# Patient Record
Sex: Female | Born: 1975 | Race: White | Hispanic: No | Smoking: Former smoker
Health system: Southern US, Community
[De-identification: ages and names within clinical notes are randomized; demographics above are authoritative.]

## PROBLEM LIST (undated history)

## (undated) DIAGNOSIS — E785 Hyperlipidemia, unspecified: Secondary | ICD-10-CM

## (undated) DIAGNOSIS — R519 Headache, unspecified: Secondary | ICD-10-CM

## (undated) DIAGNOSIS — J439 Emphysema, unspecified: Secondary | ICD-10-CM

## (undated) DIAGNOSIS — I10 Essential (primary) hypertension: Secondary | ICD-10-CM

## (undated) DIAGNOSIS — R51 Headache: Secondary | ICD-10-CM

## (undated) DIAGNOSIS — F419 Anxiety disorder, unspecified: Secondary | ICD-10-CM

## (undated) HISTORY — DX: Emphysema, unspecified: J43.9

## (undated) HISTORY — DX: Headache: R51

## (undated) HISTORY — DX: Hyperlipidemia, unspecified: E78.5

## (undated) HISTORY — PX: TONSILLECTOMY: SUR1361

## (undated) HISTORY — PX: TONSILECTOMY/ADENOIDECTOMY WITH MYRINGOTOMY: SHX6125

## (undated) HISTORY — DX: Anxiety disorder, unspecified: F41.9

## (undated) HISTORY — PX: DILATION AND CURETTAGE OF UTERUS: SHX78

## (undated) HISTORY — DX: Headache, unspecified: R51.9

---

## 2000-08-26 ENCOUNTER — Inpatient Hospital Stay (HOSPITAL_COMMUNITY): Admission: AD | Admit: 2000-08-26 | Discharge: 2000-08-26 | Payer: Self-pay | Admitting: Obstetrics

## 2000-08-26 ENCOUNTER — Encounter: Payer: Self-pay | Admitting: *Deleted

## 2003-12-31 ENCOUNTER — Emergency Department: Payer: Self-pay | Admitting: Emergency Medicine

## 2004-06-19 ENCOUNTER — Ambulatory Visit: Payer: Self-pay

## 2007-12-28 ENCOUNTER — Emergency Department: Payer: Self-pay | Admitting: Internal Medicine

## 2008-08-11 ENCOUNTER — Emergency Department: Payer: Self-pay | Admitting: Unknown Physician Specialty

## 2011-03-13 ENCOUNTER — Encounter: Payer: Self-pay | Admitting: Obstetrics & Gynecology

## 2011-03-13 IMAGING — US US OB NUCHAL TRANSLUCENCY 1ST GEST - [facility] NRPT
1 series · 14 of 28 positions shown · non-contrast
Comparison: none

[Series 1: us ob nuchal translucency 1st gest - [facility] nrpt · 14 of 38 slices shown]
[im 2/38]
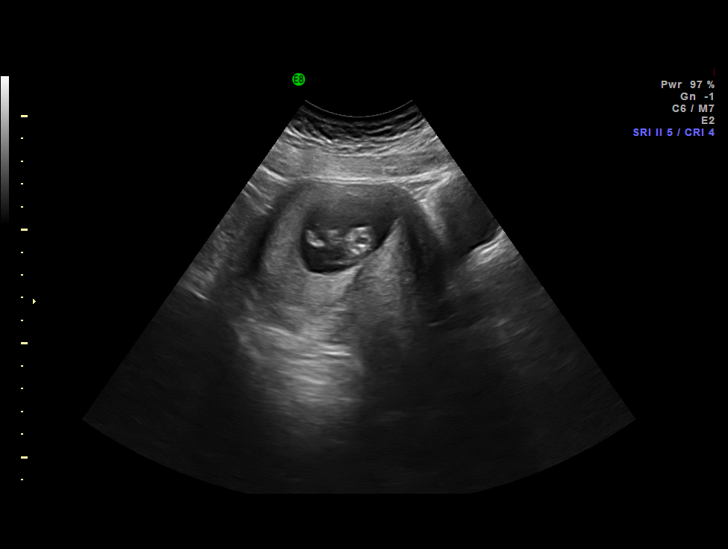
[im 5/38]
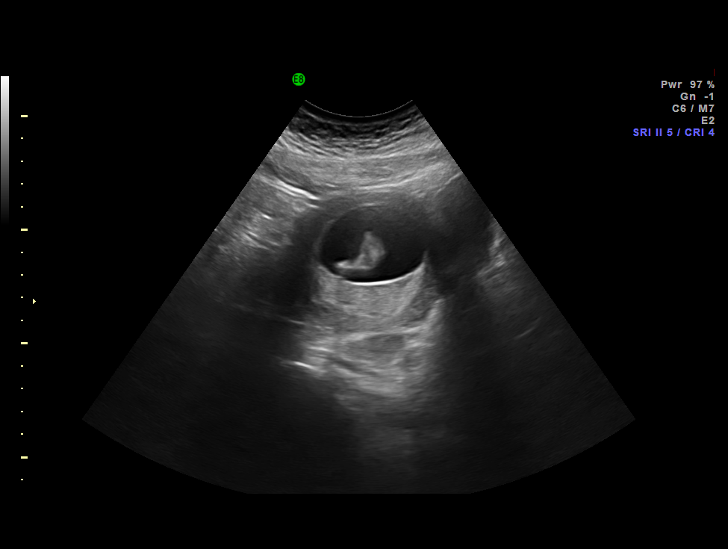
[im 7/38]
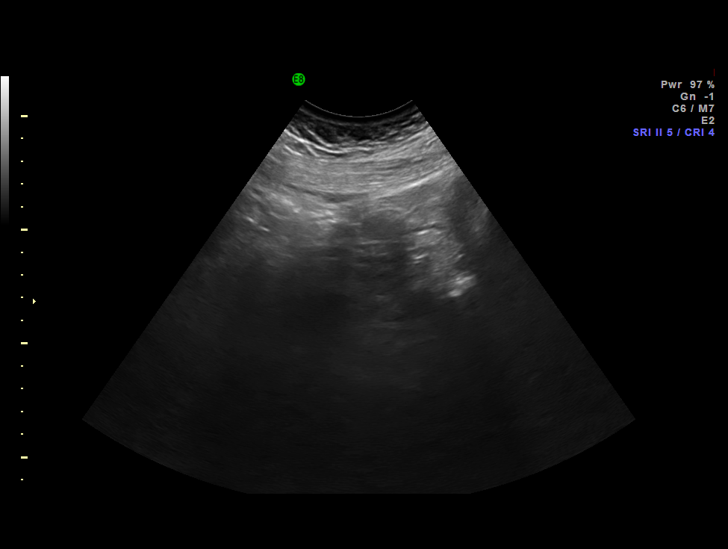
[im 10/38]
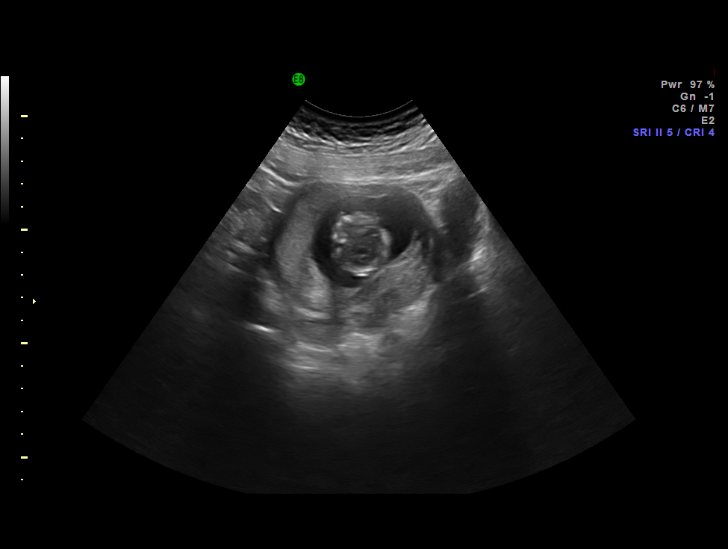
[im 13/38]
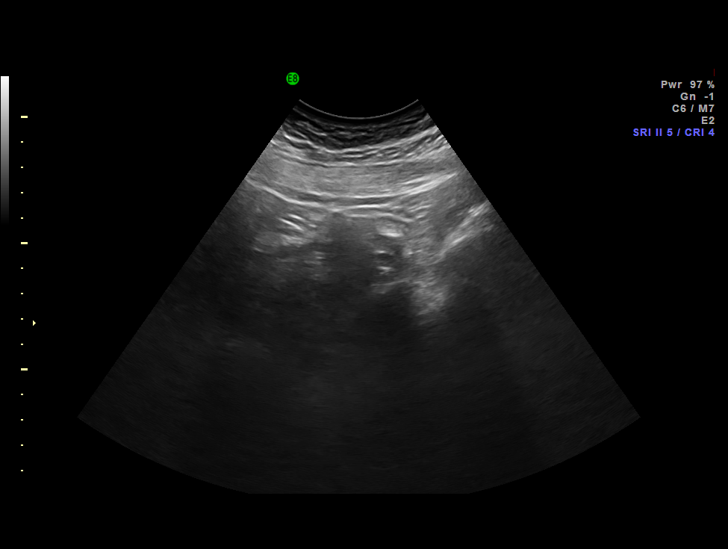
[im 16/38]
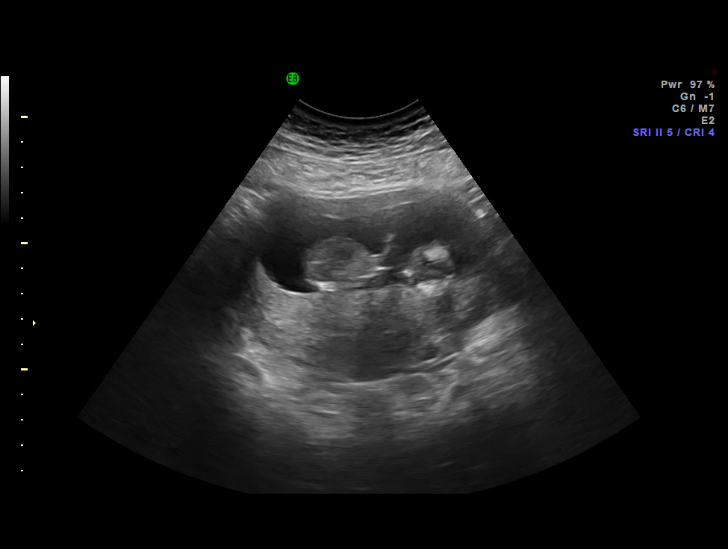
[im 18/38]
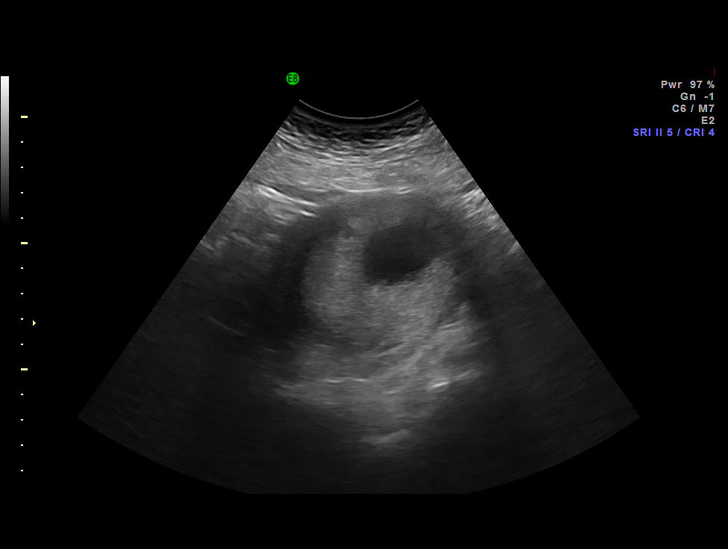
[im 21/38]
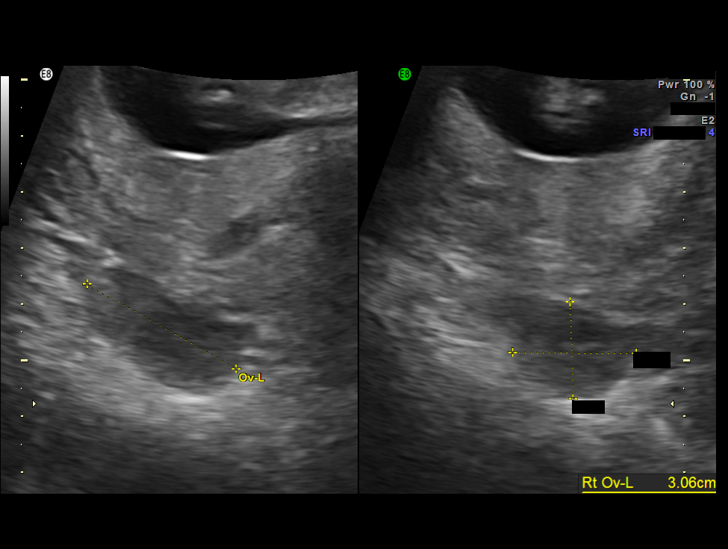
[im 24/38]
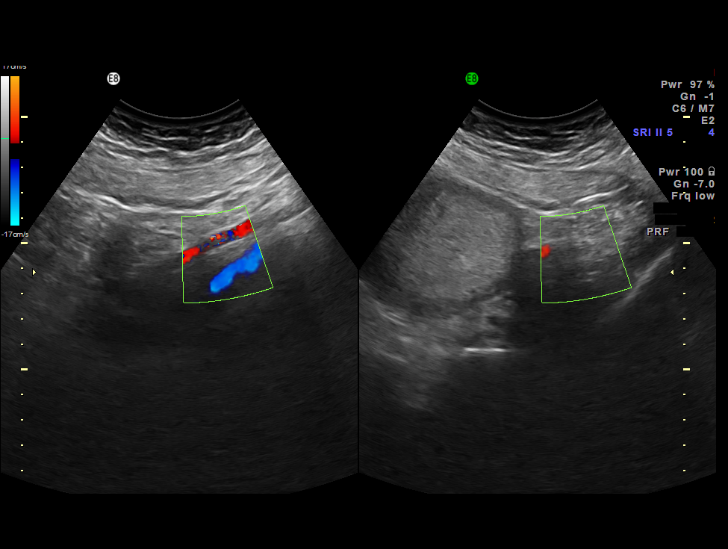
[im 27/38]
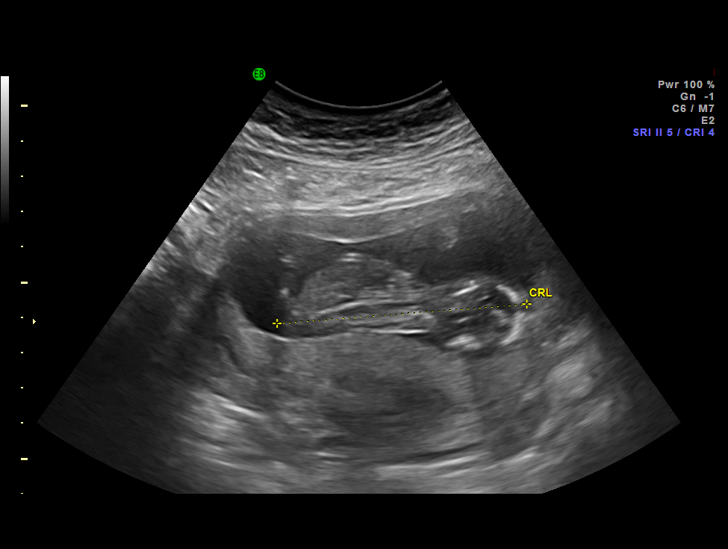
[im 29/38]
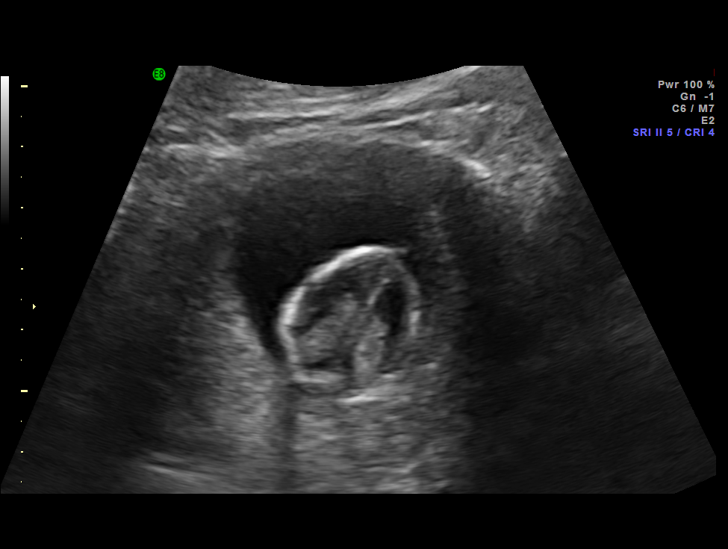
[im 32/38]
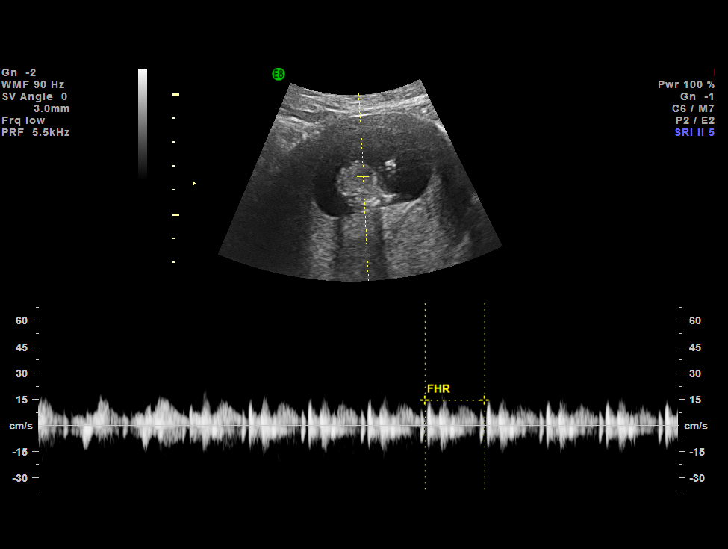
[im 35/38]
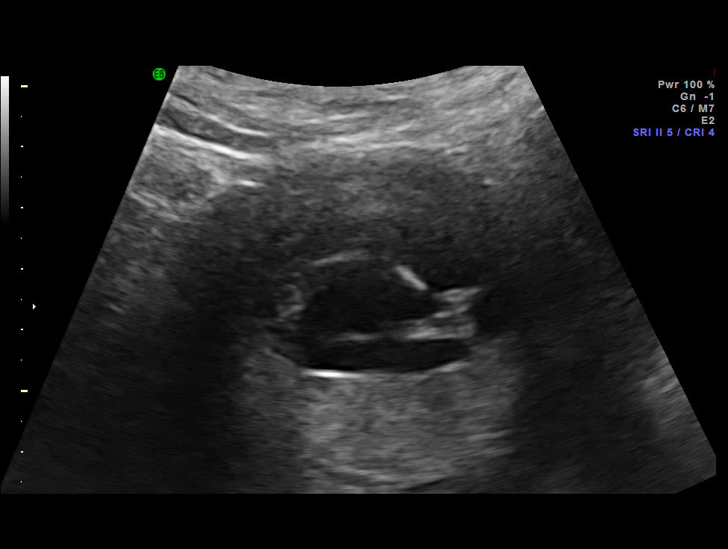
[im 38/38]
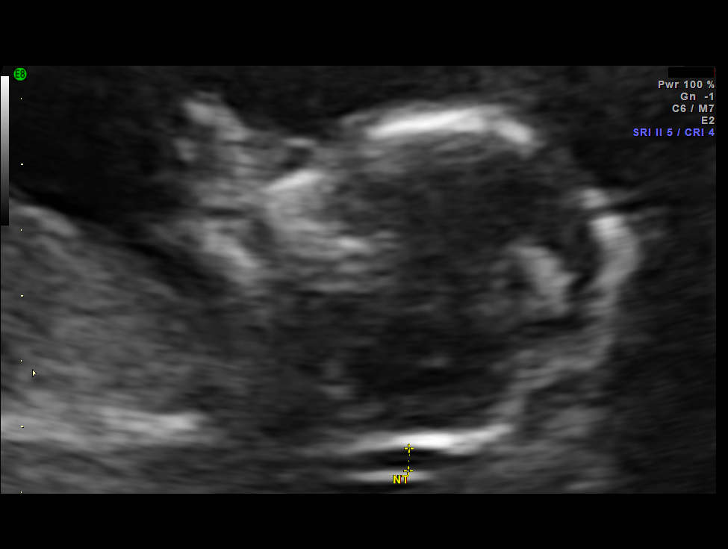

[14 of 28 positions shown; findings below may reference images not displayed]

IMAGES IMPORTED FROM THE [employer] WORKFLOW SYSTEM
NO DICTATION FOR STUDY

## 2011-03-25 HISTORY — PX: TUBAL LIGATION: SHX77

## 2011-04-21 ENCOUNTER — Encounter: Payer: Self-pay | Admitting: Obstetrics and Gynecology

## 2011-04-21 IMAGING — US US OB DETAIL+14 WK - NRPT [facility]
1 series · 14 of 28 positions shown · non-contrast
Comparison: none

[Series 1: us ob detail+14 wk - nrpt [facility] · 14 of 85 slices shown]
[im 4/85]
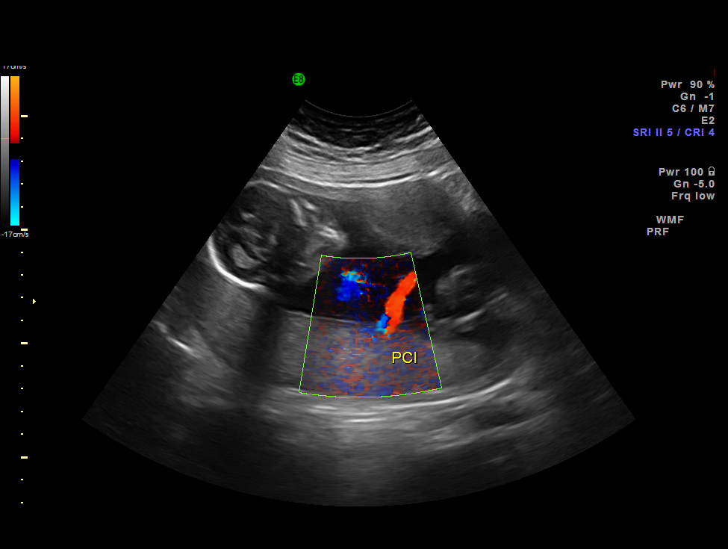
[im 10/85]
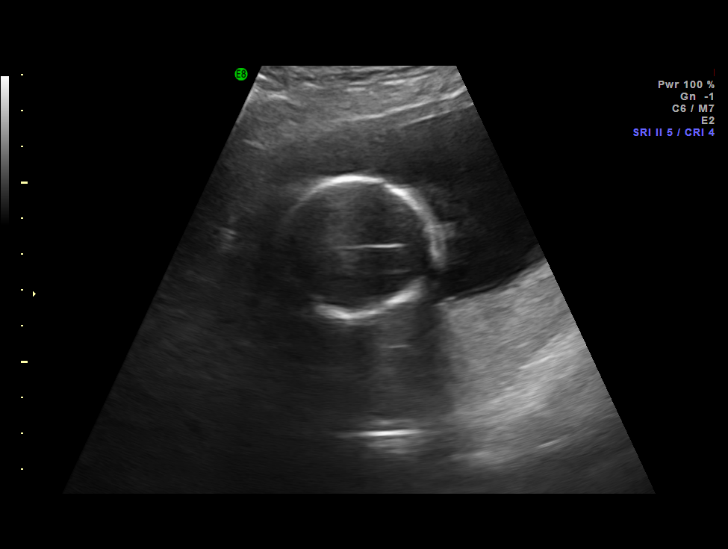
[im 16/85]
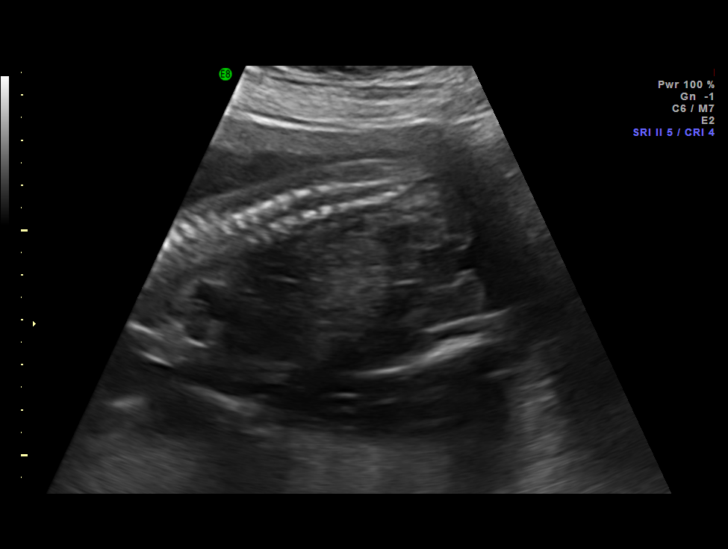
[im 22/85]
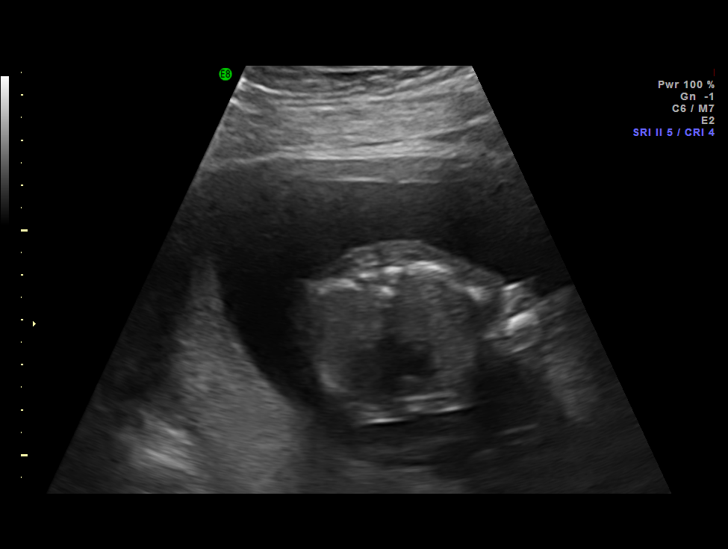
[im 29/85]
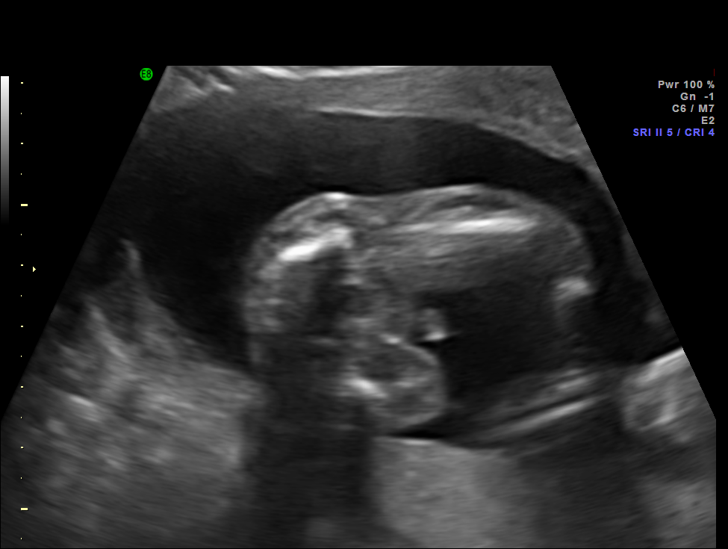
[im 35/85]
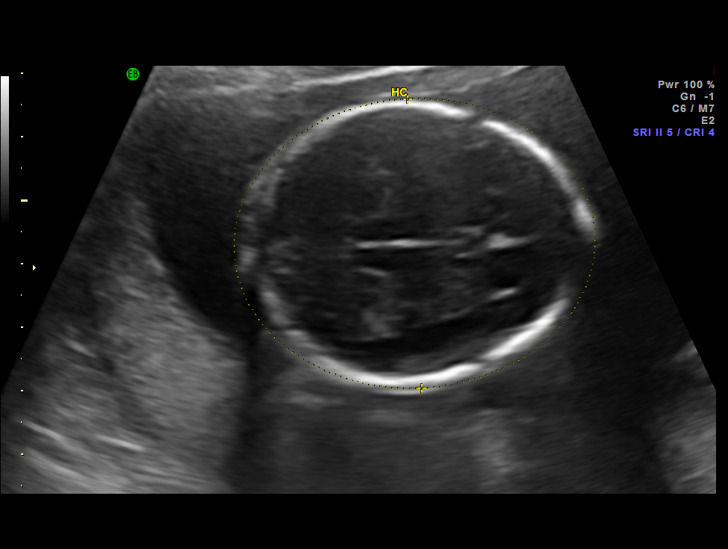
[im 41/85]
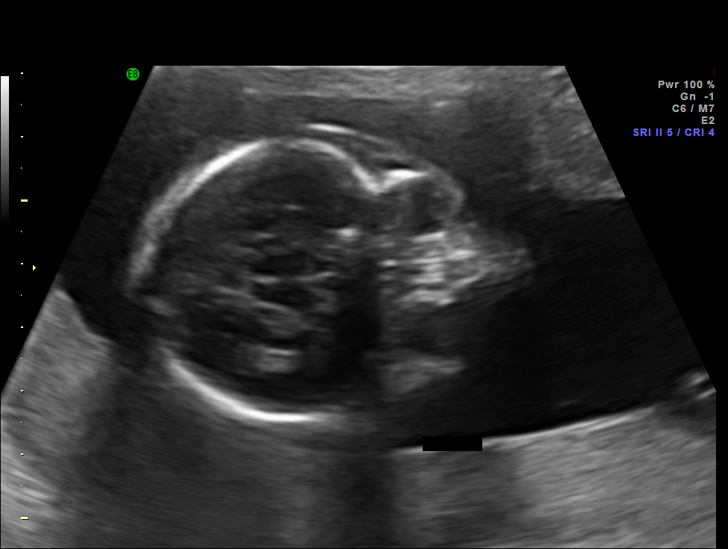
[im 47/85]
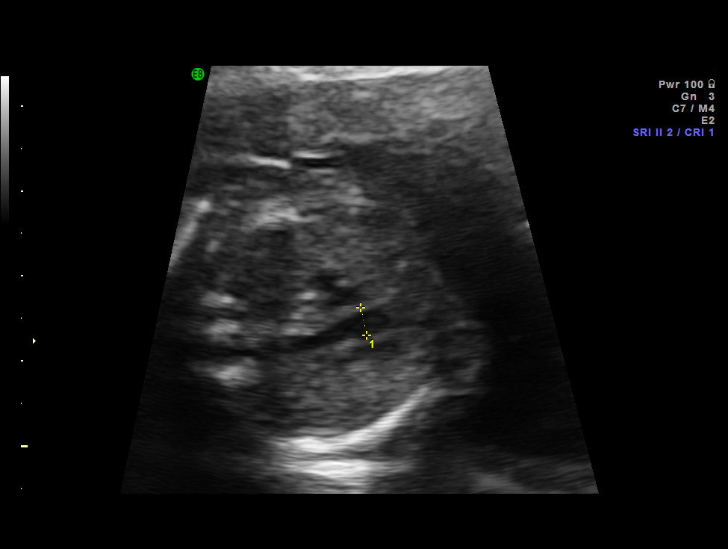
[im 53/85]
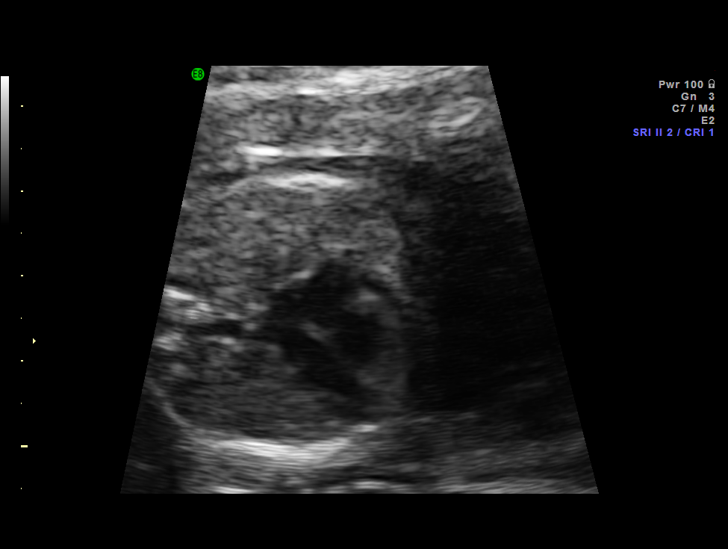
[im 60/85]
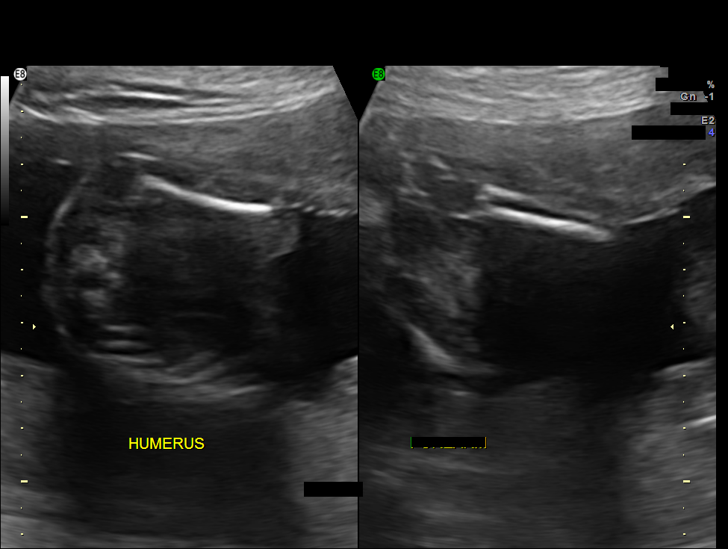
[im 66/85]
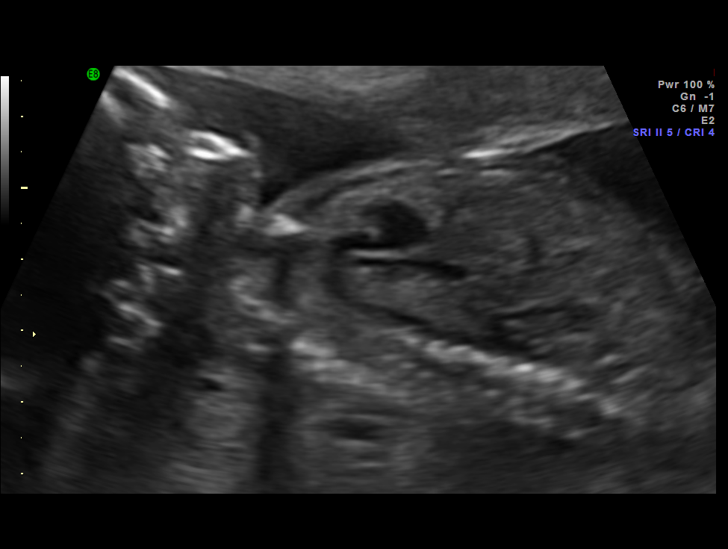
[im 72/85]
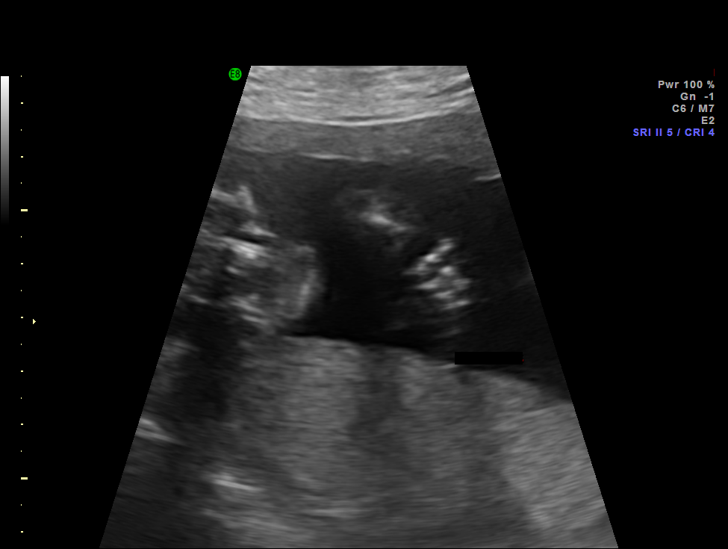
[im 78/85]
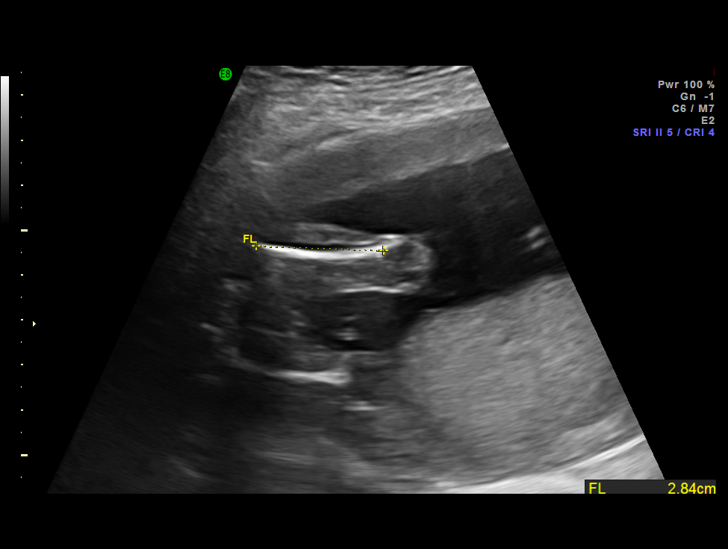
[im 85/85]
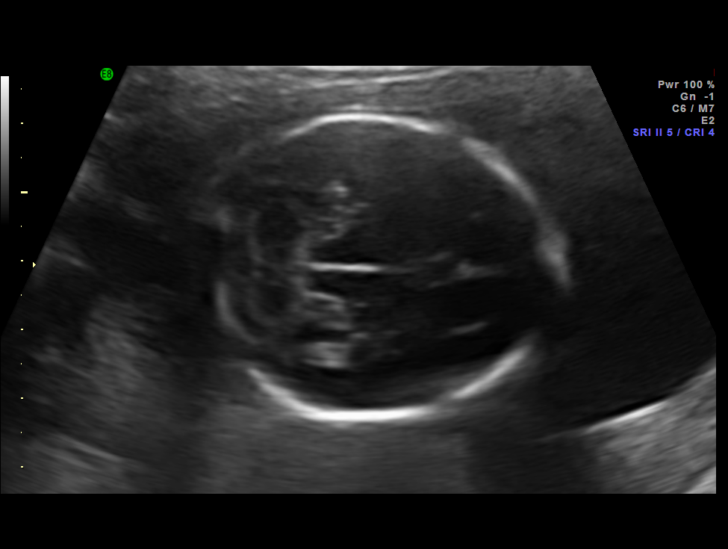

[14 of 28 positions shown; findings below may reference images not displayed]

IMAGES IMPORTED FROM THE SYNGO WORKFLOW SYSTEM
NO DICTATION FOR STUDY

## 2011-05-05 ENCOUNTER — Encounter: Payer: Self-pay | Admitting: Obstetrics and Gynecology

## 2011-05-05 IMAGING — US ULTRAOUND OB LIMITED - NRPT [facility]
1 series · 14 of 17 positions shown · non-contrast
Comparison: none

[Series 1: ultraound ob limited - nrpt [facility] · 14 of 17 slices shown]
[im 1/17]
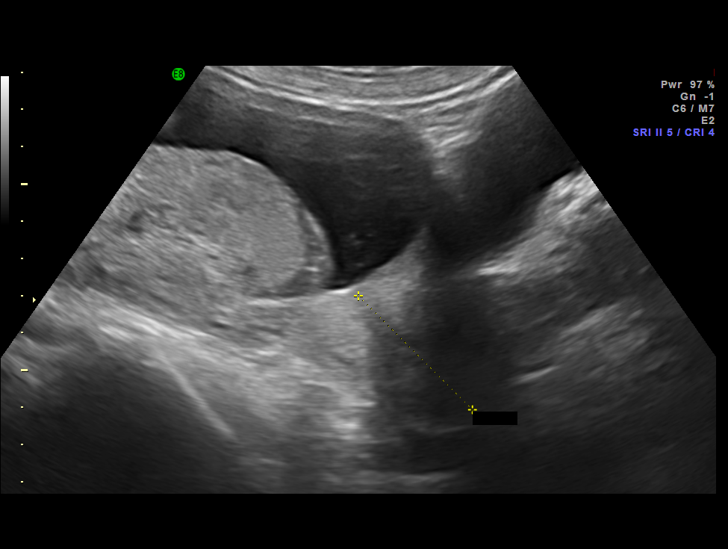
[im 2/17]
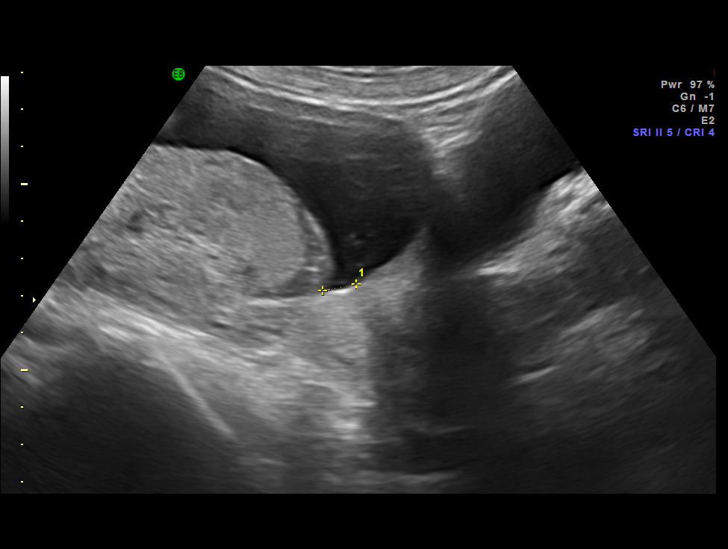
[im 4/17]
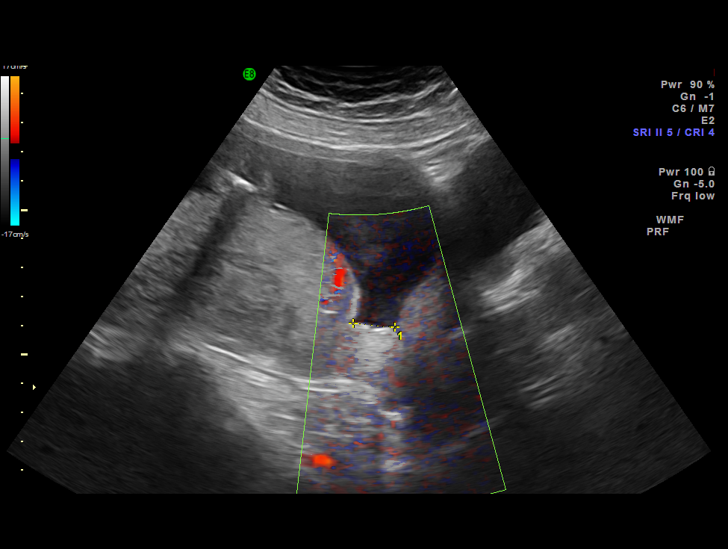
[im 5/17]
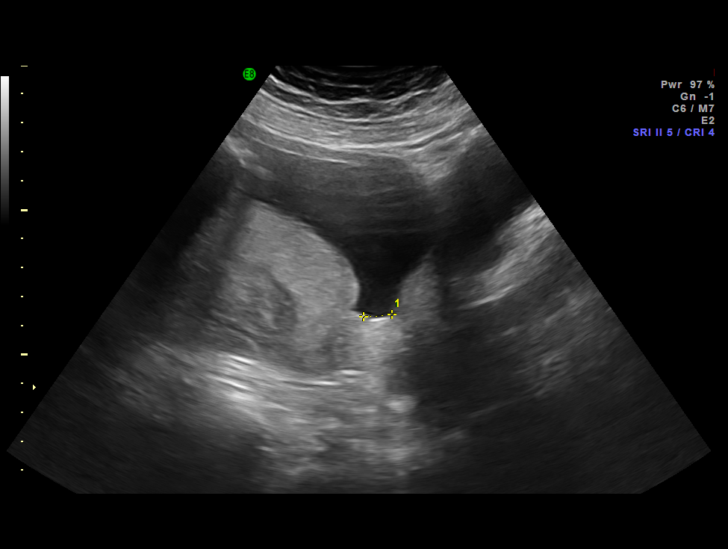
[im 6/17]
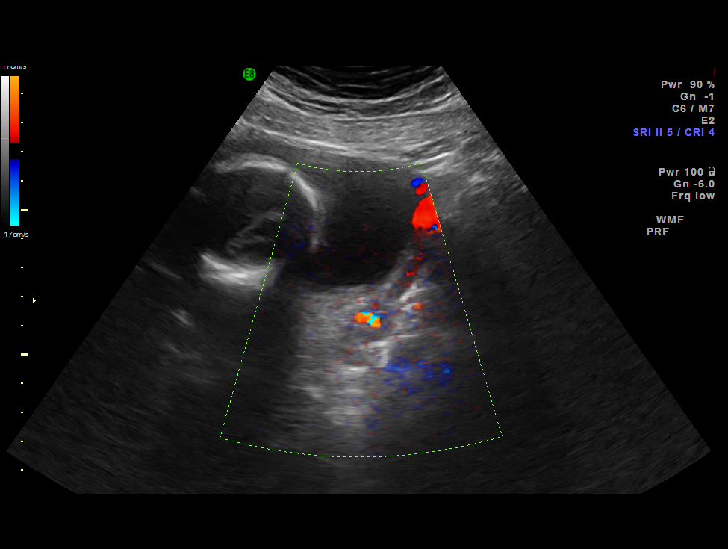
[im 7/17]
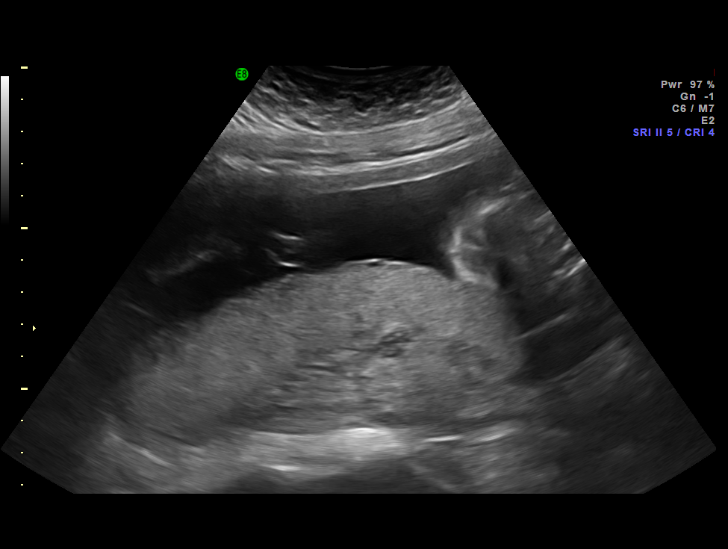
[im 8/17]
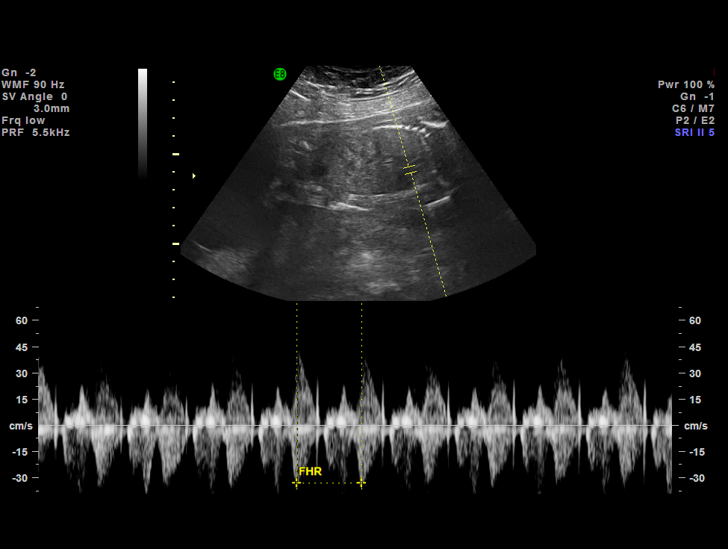
[im 10/17]
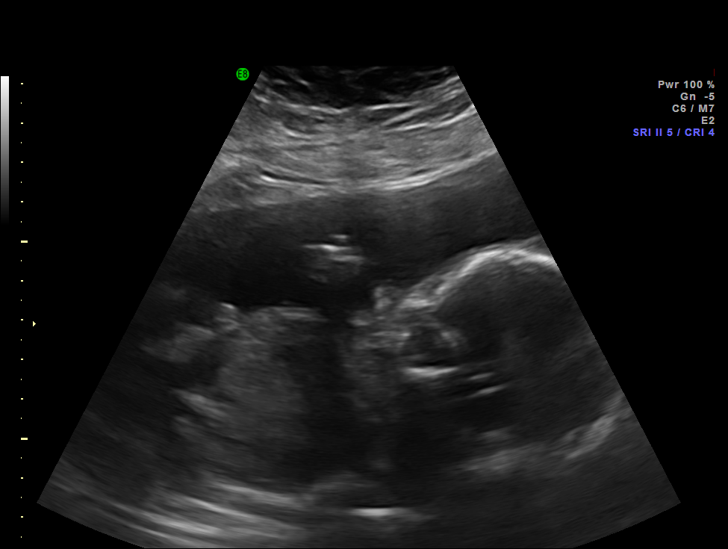
[im 11/17]
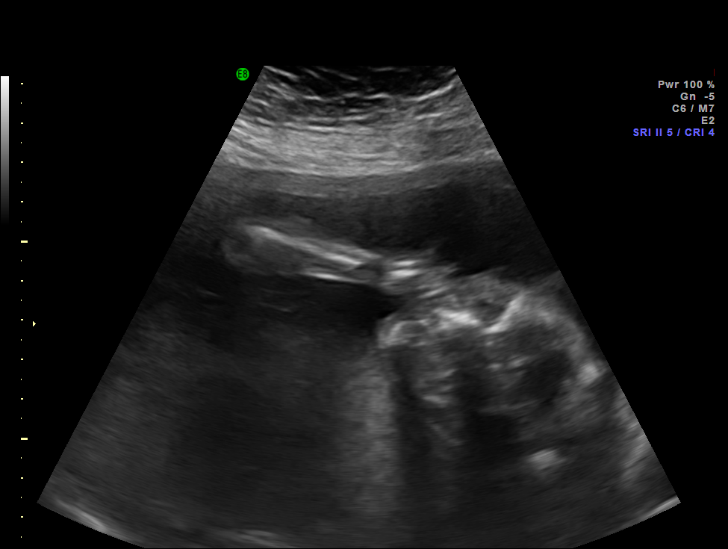
[im 12/17]
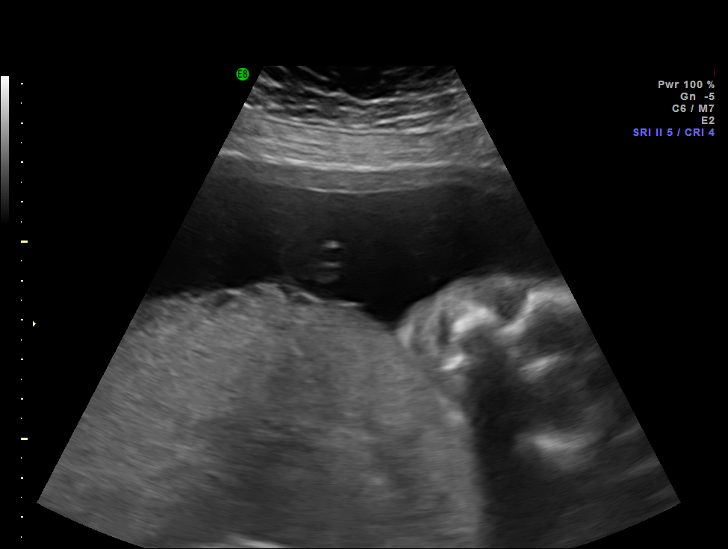
[im 13/17]
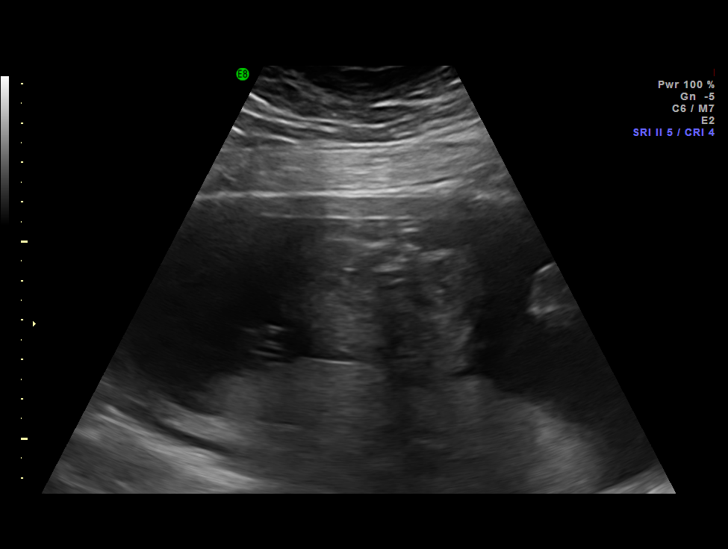
[im 14/17]
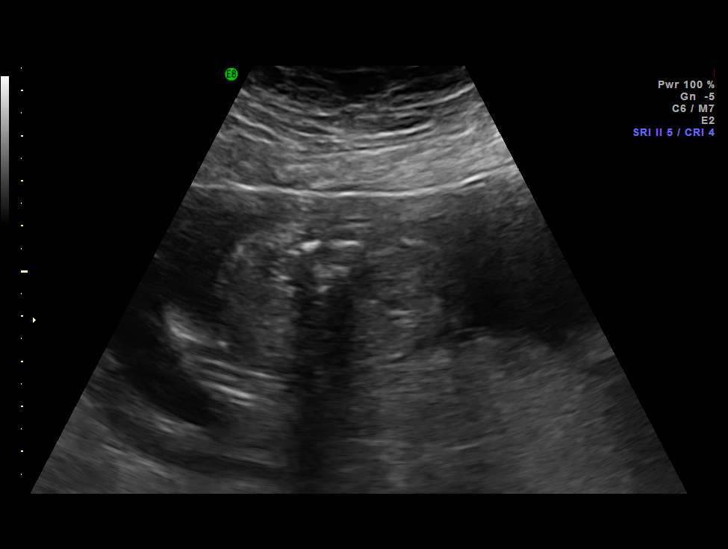
[im 16/17]
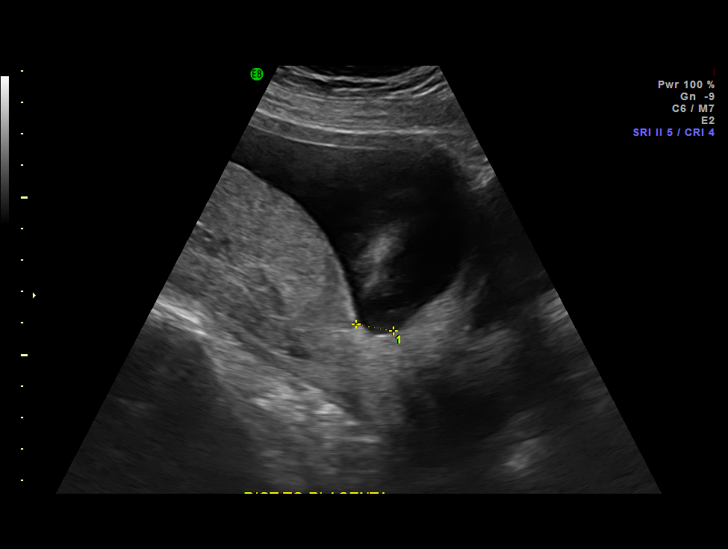
[im 17/17]
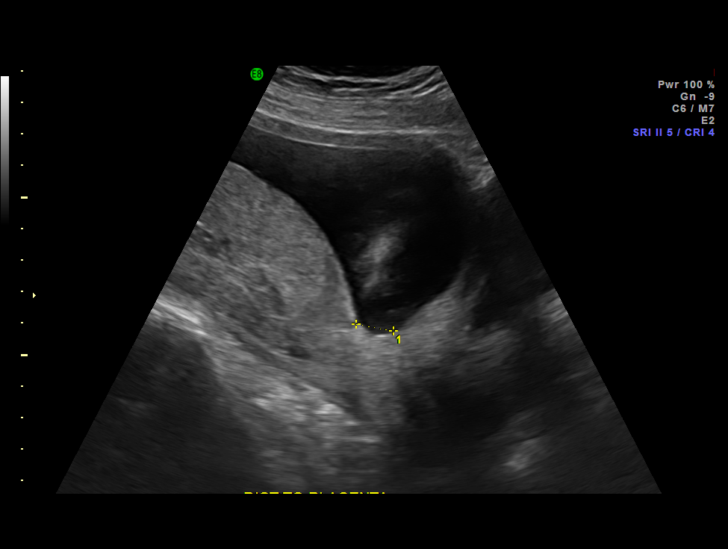

[14 of 17 positions shown; findings below may reference images not displayed]

IMAGES IMPORTED FROM THE SYNGO WORKFLOW SYSTEM
NO DICTATION FOR STUDY

## 2011-05-19 ENCOUNTER — Encounter: Payer: Self-pay | Admitting: Obstetrics & Gynecology

## 2011-05-19 IMAGING — US ULTRAOUND OB LIMITED - NRPT [facility]
1 series · 13 of 13 positions shown · non-contrast
Comparison: none

[Series 1: ultraound ob limited - nrpt [facility] · 13 of 13 slices shown]
[im 1/13]
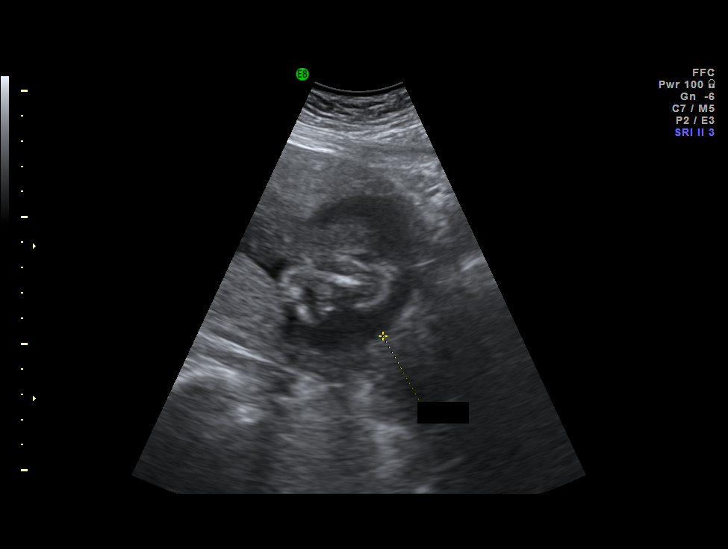
[im 2/13]
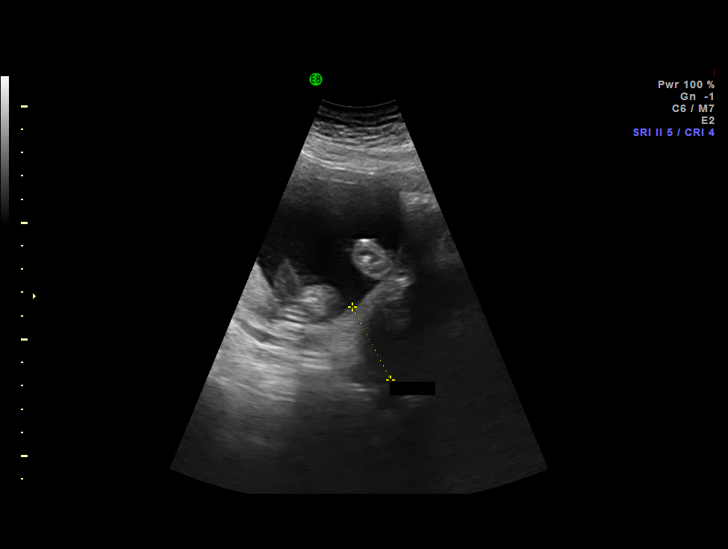
[im 3/13]
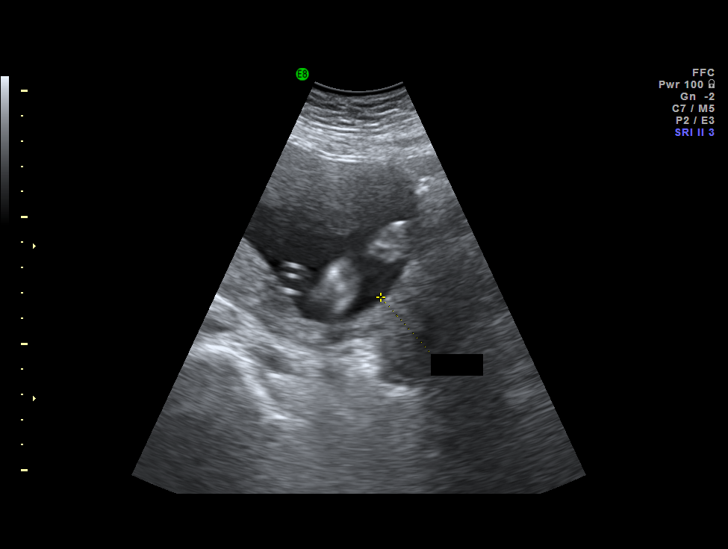
[im 4/13]
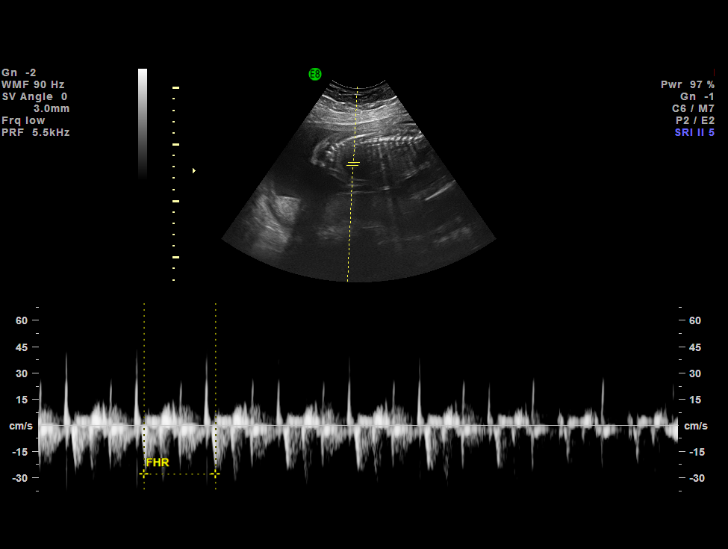
[im 5/13]
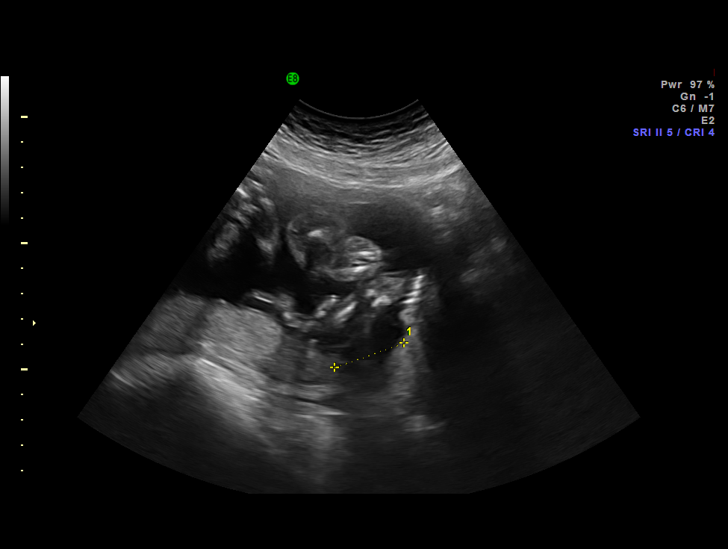
[im 6/13]
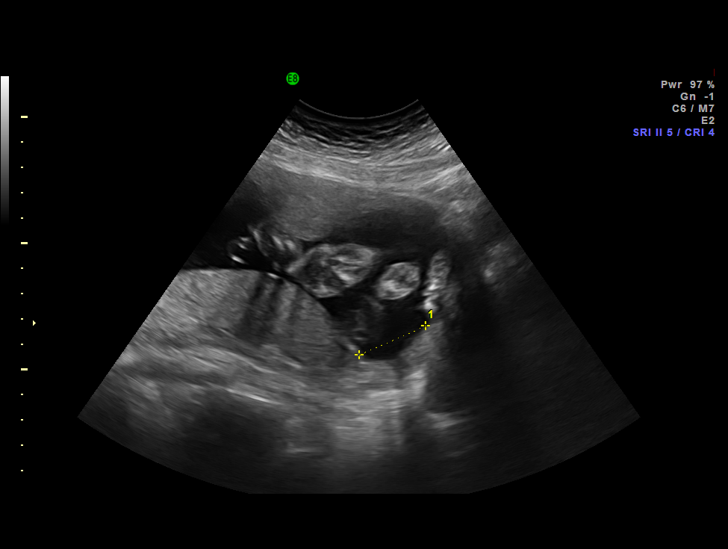
[im 7/13]
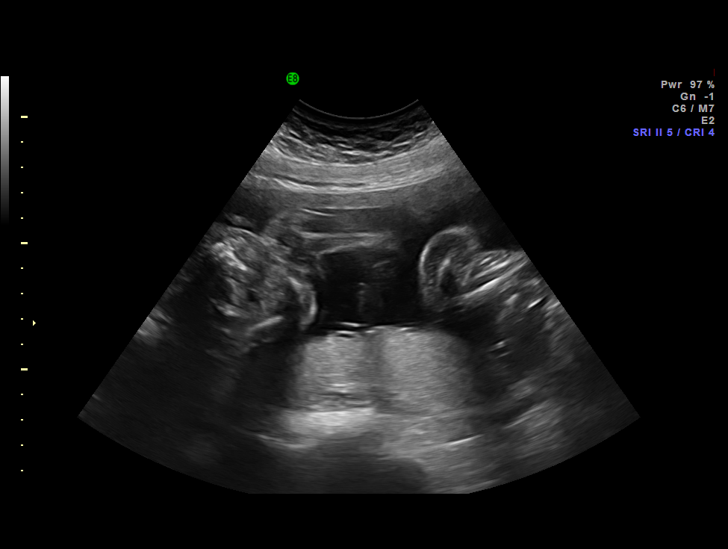
[im 8/13]
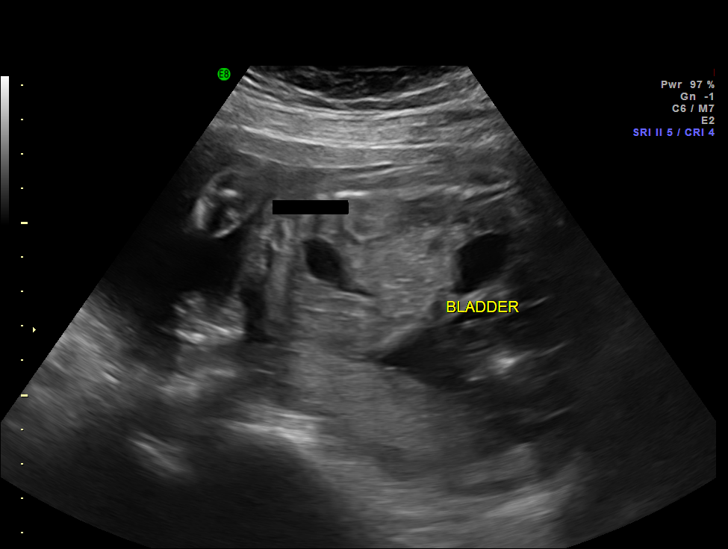
[im 9/13]
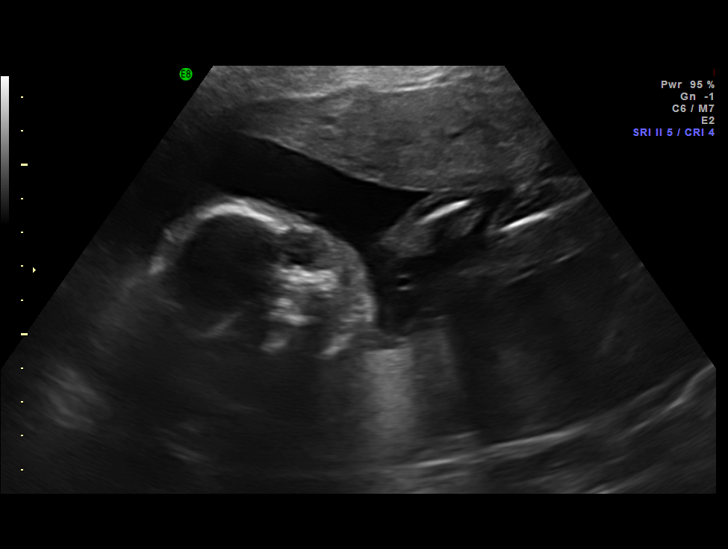
[im 10/13]
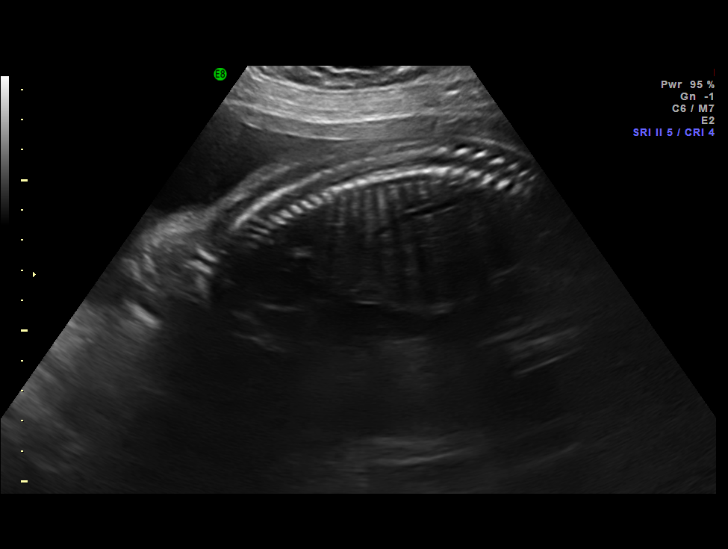
[im 11/13]
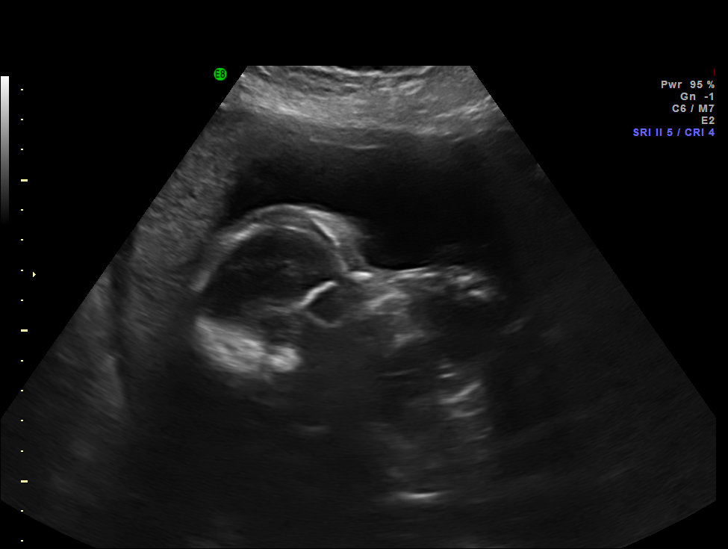
[im 12/13]
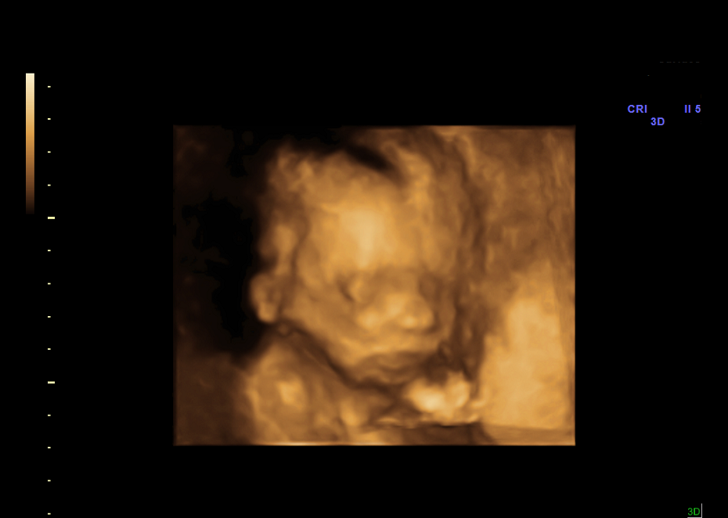
[im 13/13]
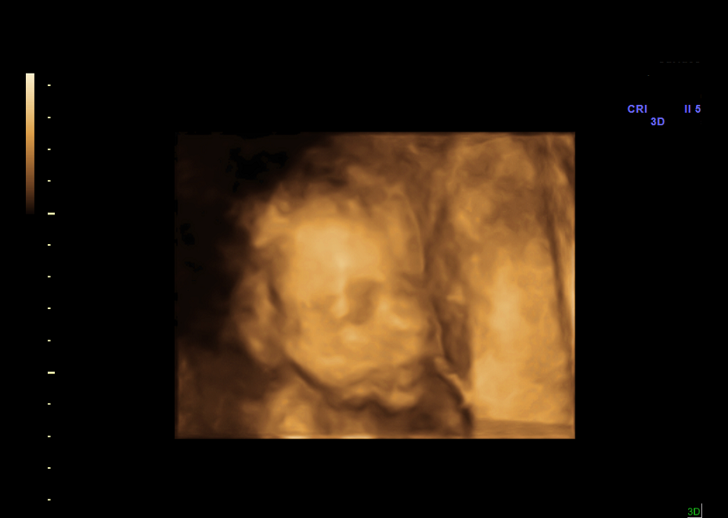

[13 of 13 positions shown; findings below may reference images not displayed]

IMAGES IMPORTED FROM THE SYNGO WORKFLOW SYSTEM
NO DICTATION FOR STUDY

## 2011-06-02 ENCOUNTER — Encounter: Payer: Self-pay | Admitting: Maternal & Fetal Medicine

## 2011-06-02 IMAGING — US ULTRAOUND OB LIMITED - NRPT [facility]
1 series · 13 of 13 positions shown · non-contrast
Comparison: none

[Series 1: ultraound ob limited - nrpt [facility] · 13 of 13 slices shown]
[im 1/13]
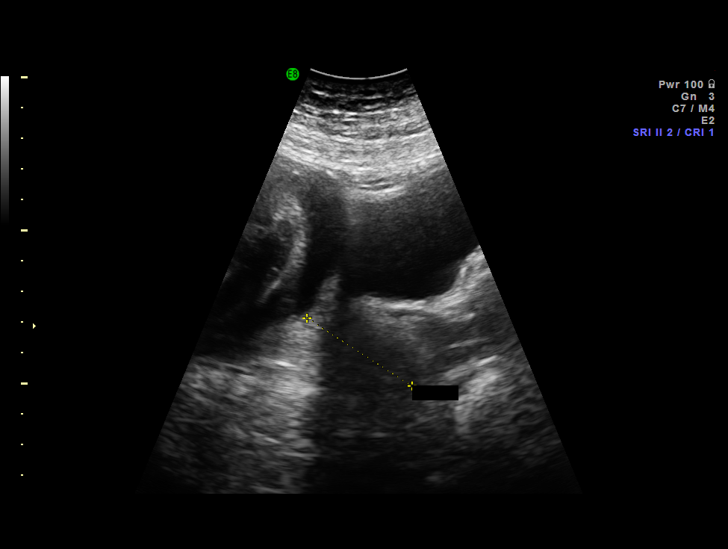
[im 2/13]
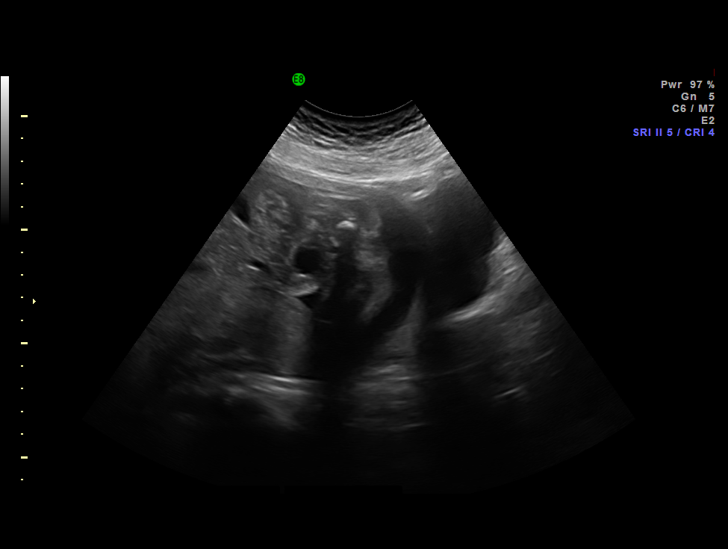
[im 3/13]
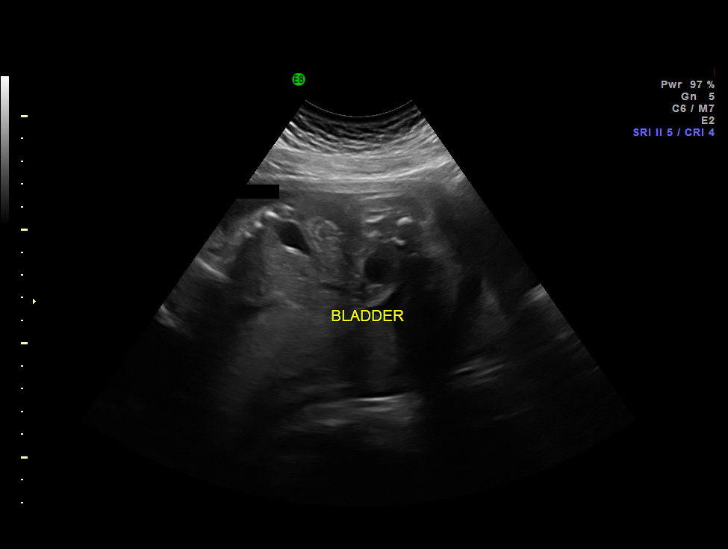
[im 4/13]
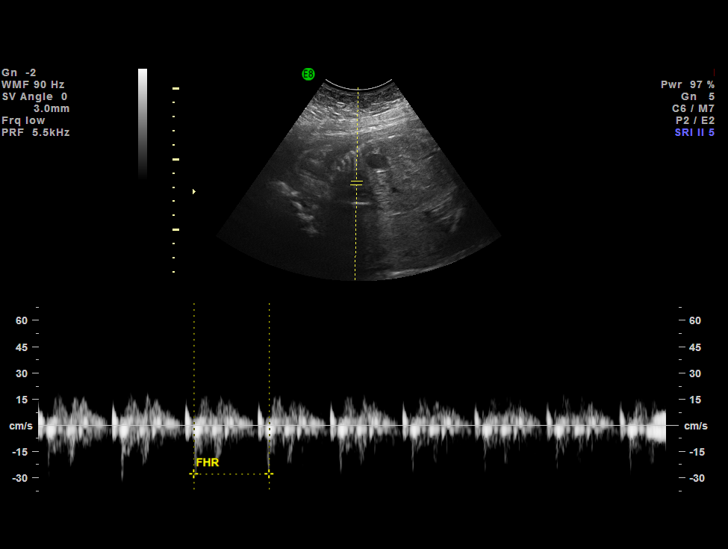
[im 5/13]
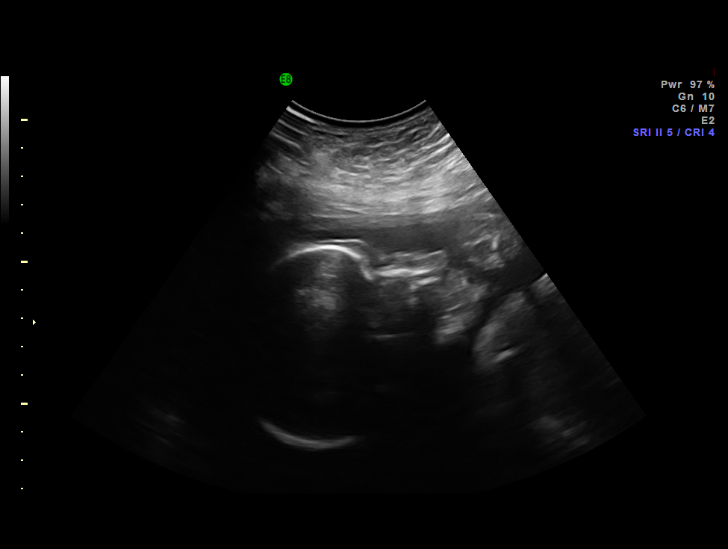
[im 6/13]
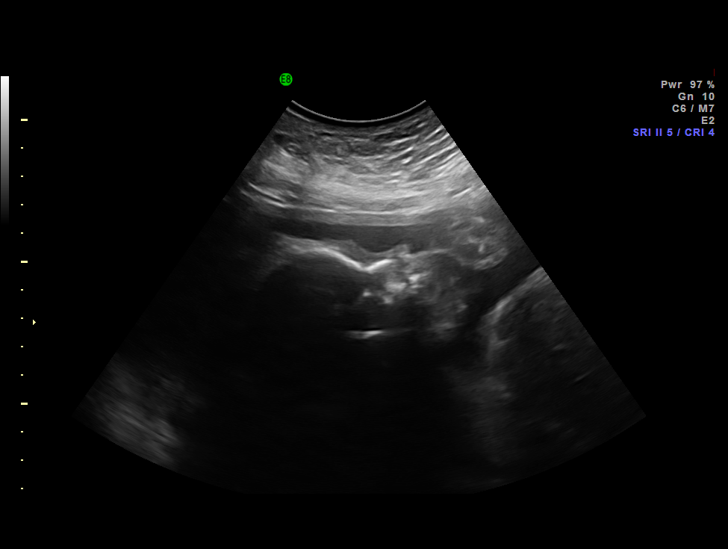
[im 7/13]
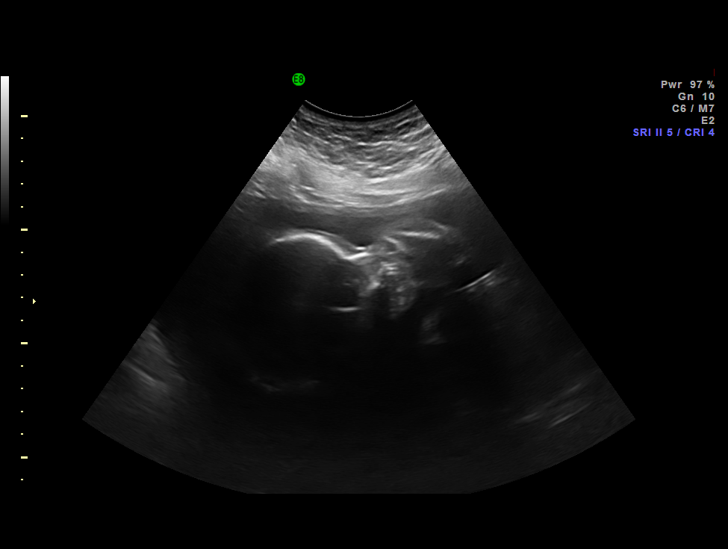
[im 8/13]
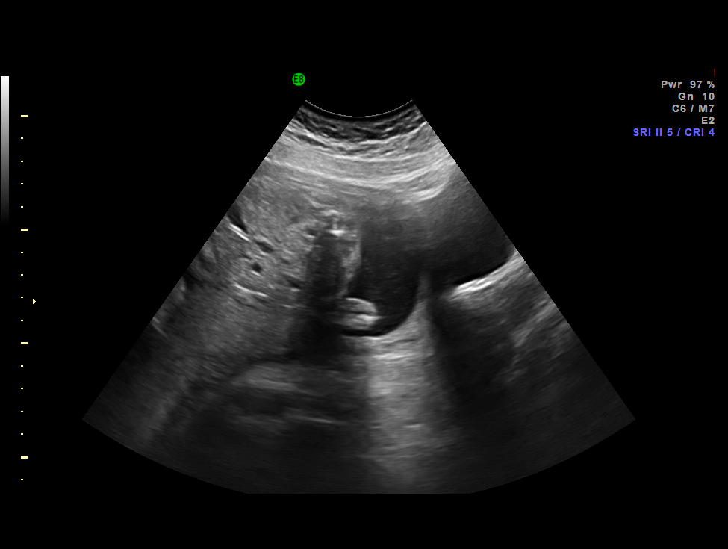
[im 9/13]
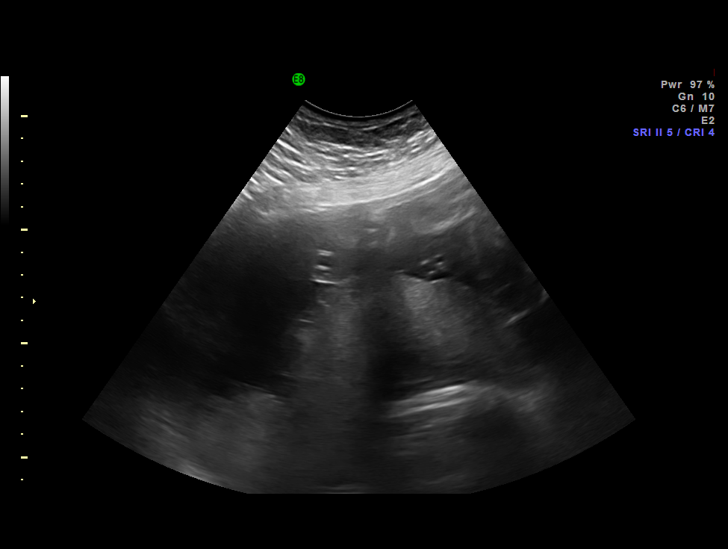
[im 10/13]
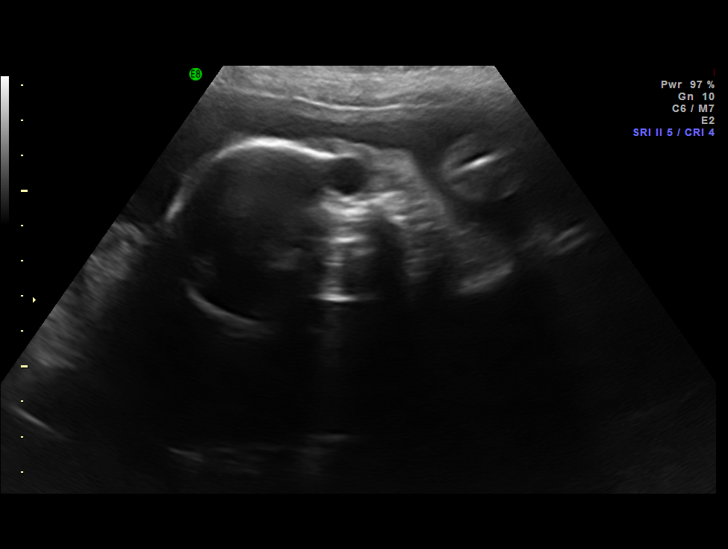
[im 11/13]
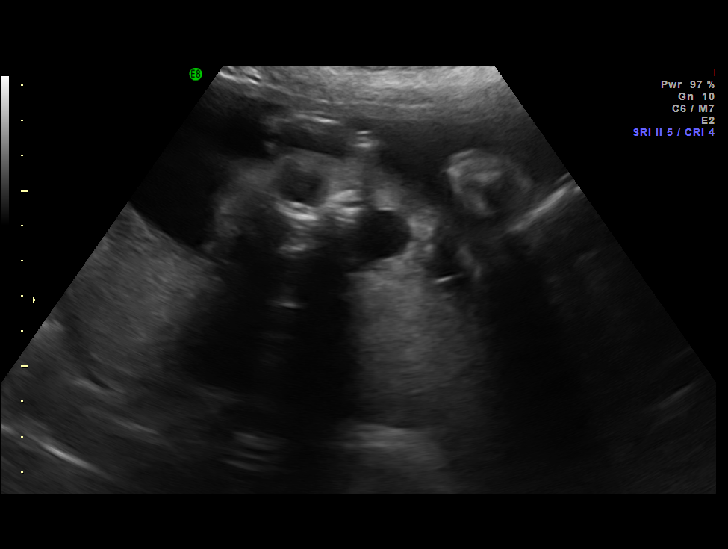
[im 12/13]
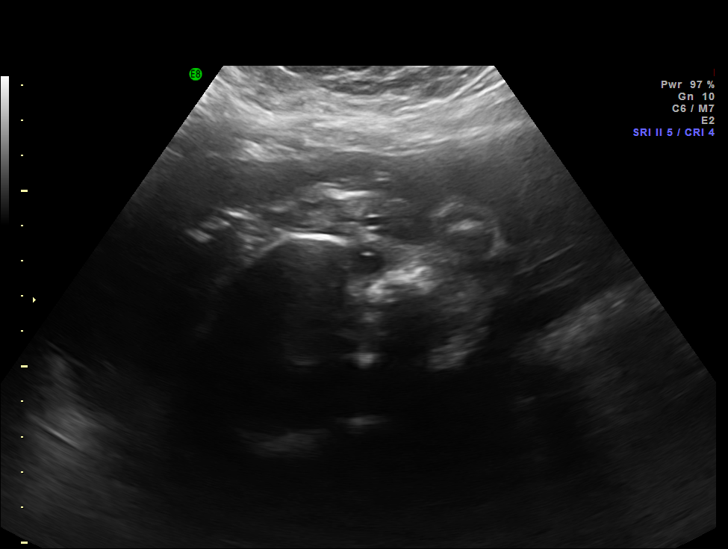
[im 13/13]
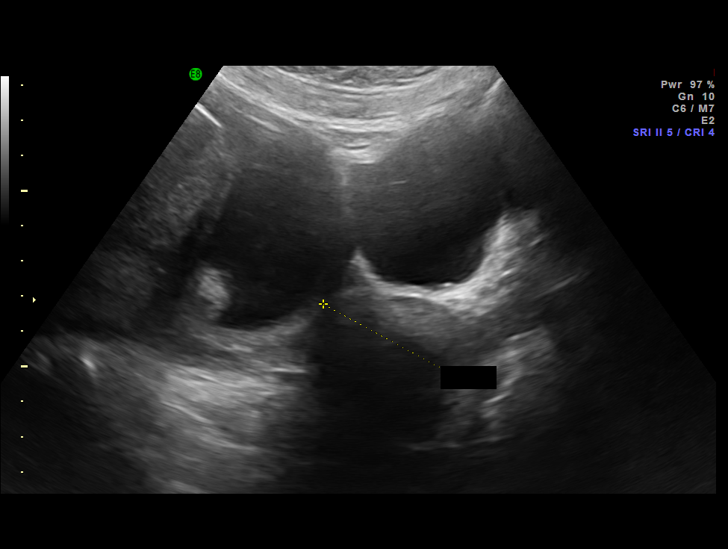

[13 of 13 positions shown; findings below may reference images not displayed]

IMAGES IMPORTED FROM THE SYNGO WORKFLOW SYSTEM
NO DICTATION FOR STUDY

## 2011-06-16 ENCOUNTER — Encounter: Payer: Self-pay | Admitting: Obstetrics and Gynecology

## 2011-06-16 IMAGING — US US OB FOLLOW-UP - [facility]PT [facility]
1 series · 14 of 28 positions shown · non-contrast
Comparison: none

[Series 1: us ob follow-up - nrpt [facility] · 14 of 64 slices shown]
[im 3/64]
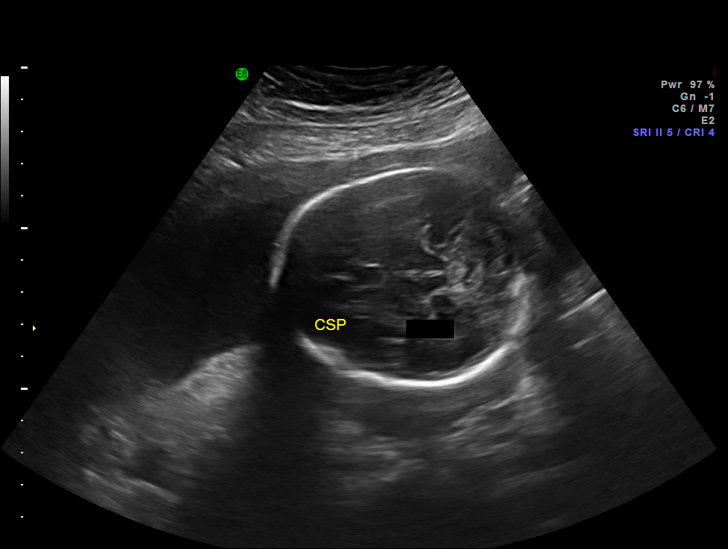
[im 8/64]
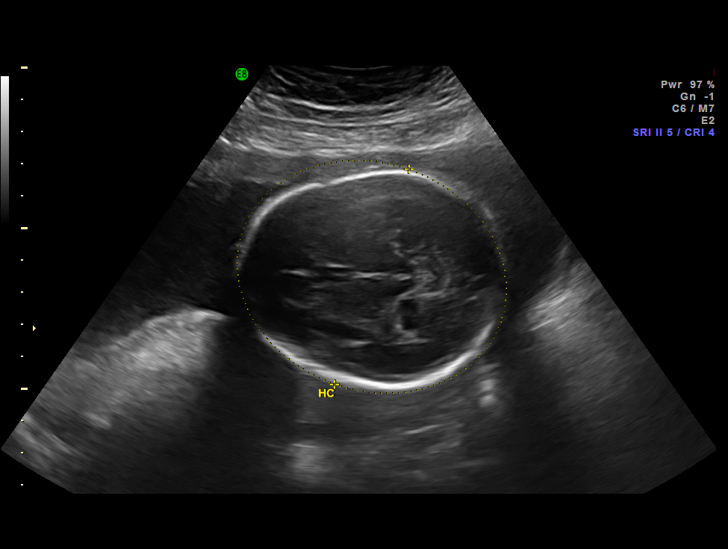
[im 12/64]
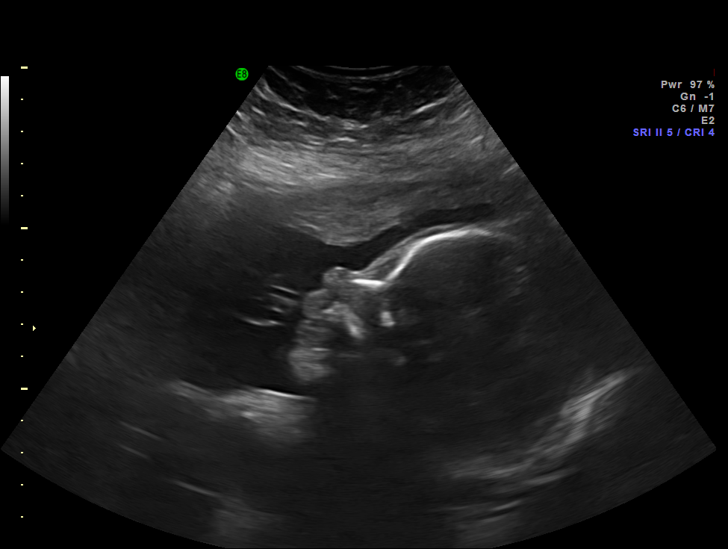
[im 17/64]
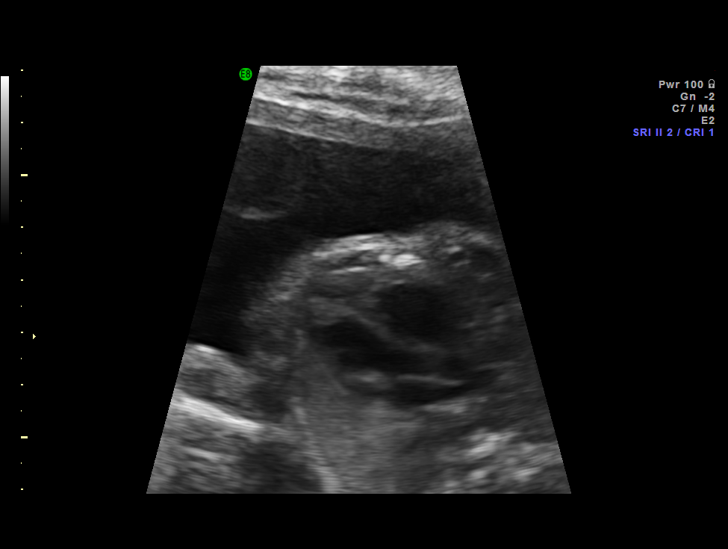
[im 22/64]
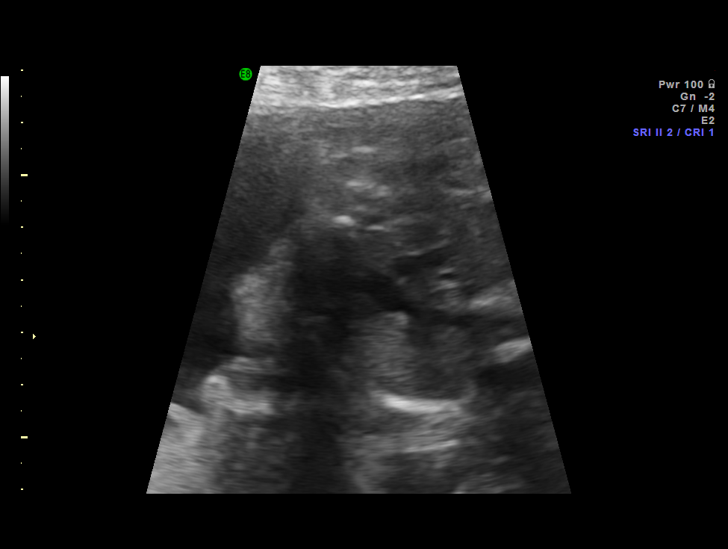
[im 26/64]
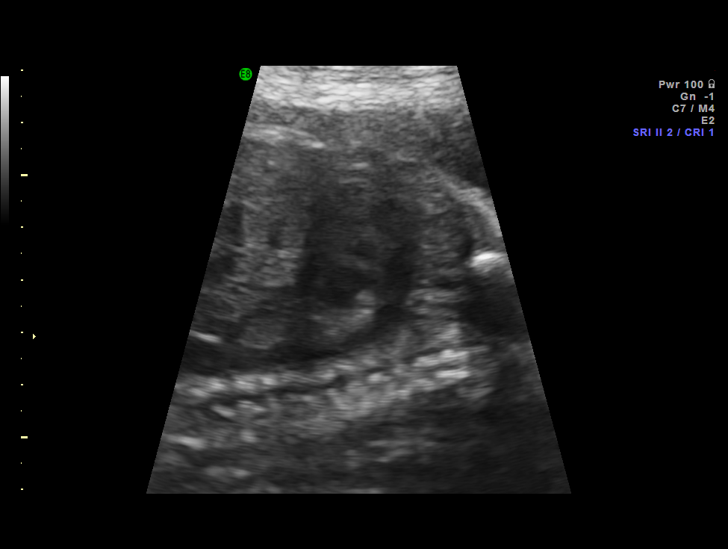
[im 31/64]
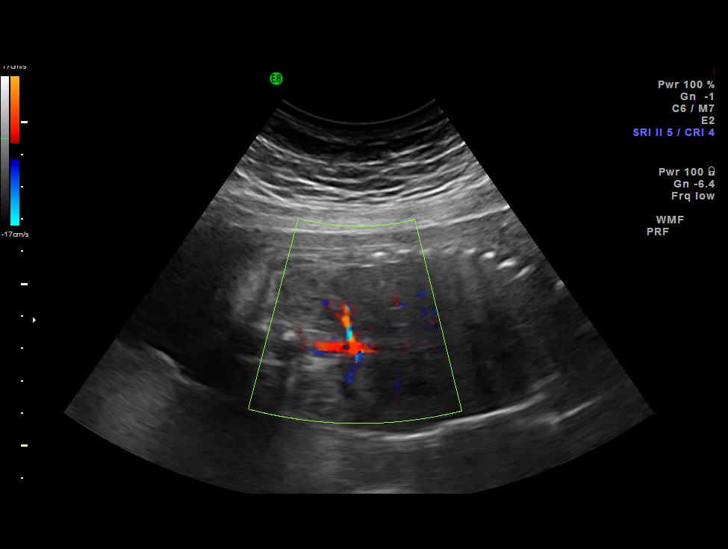
[im 36/64]
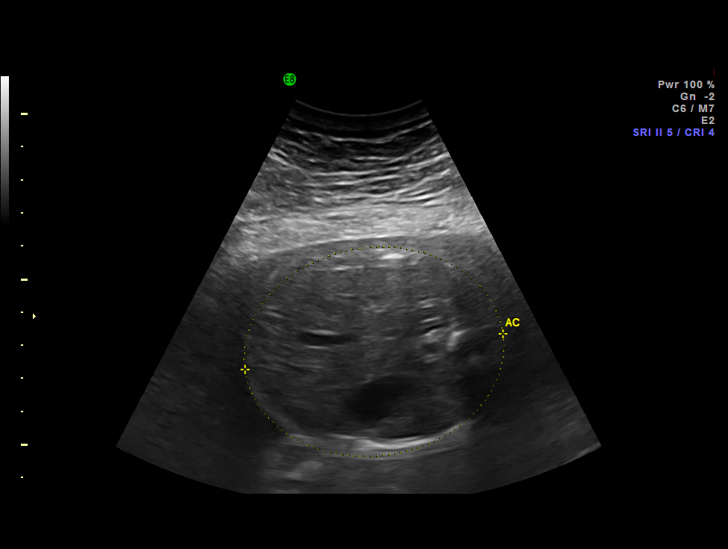
[im 40/64]
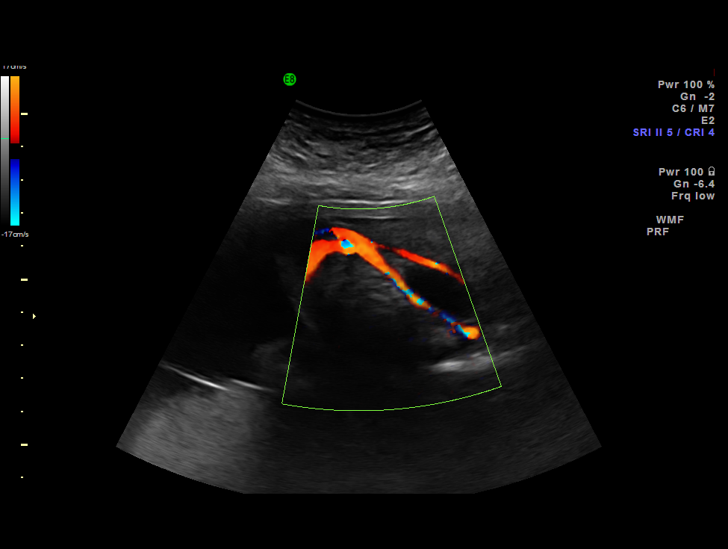
[im 45/64]
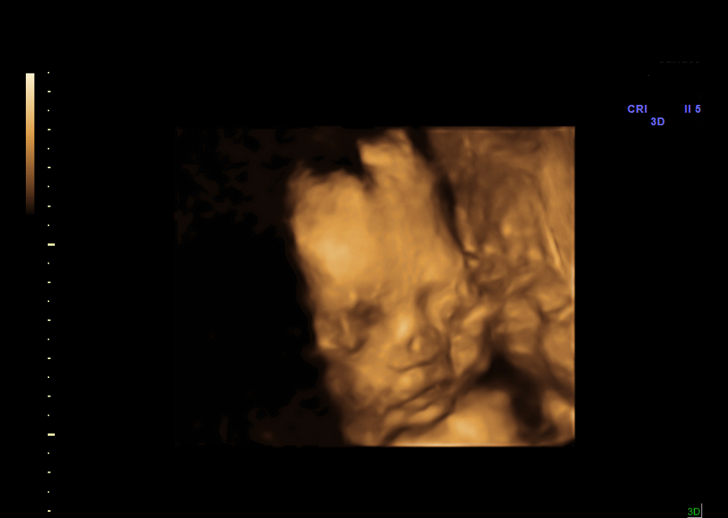
[im 50/64]
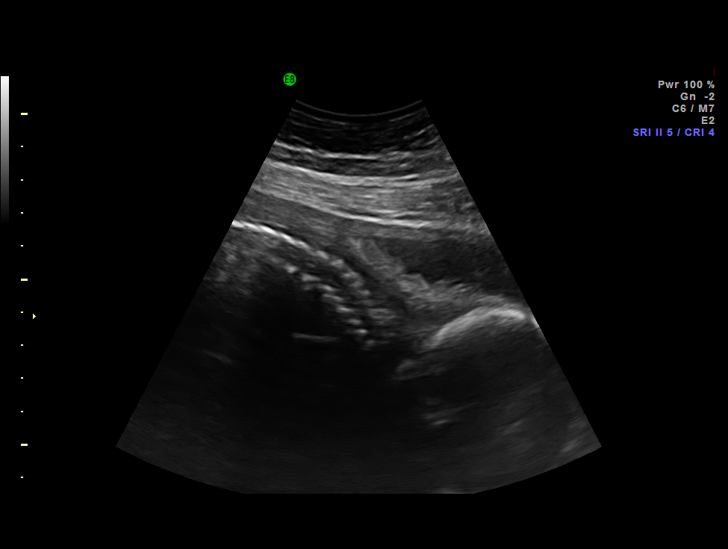
[im 54/64]
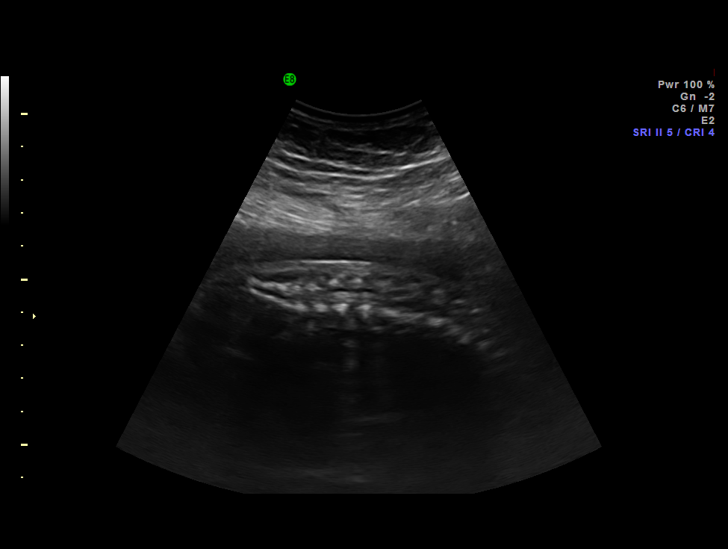
[im 59/64]
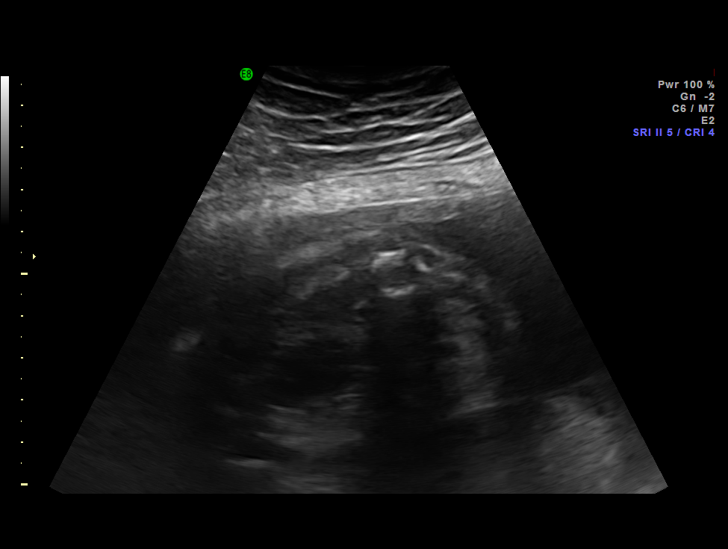
[im 64/64]
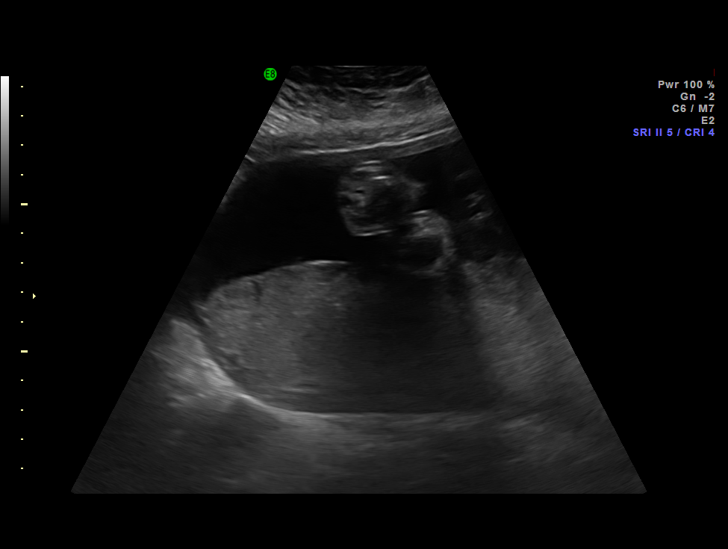

[14 of 28 positions shown; findings below may reference images not displayed]

IMAGES IMPORTED FROM THE SYNGO WORKFLOW SYSTEM
NO DICTATION FOR STUDY

## 2011-06-30 ENCOUNTER — Encounter: Payer: Self-pay | Admitting: Maternal & Fetal Medicine

## 2011-06-30 IMAGING — US ULTRAOUND OB LIMITED - [facility]PT [facility]
1 series · 10 of 10 positions shown · non-contrast
Comparison: none

[Series 1: ultraound ob limited - nrpt [facility] · 10 of 10 slices shown]
[im 1/10]
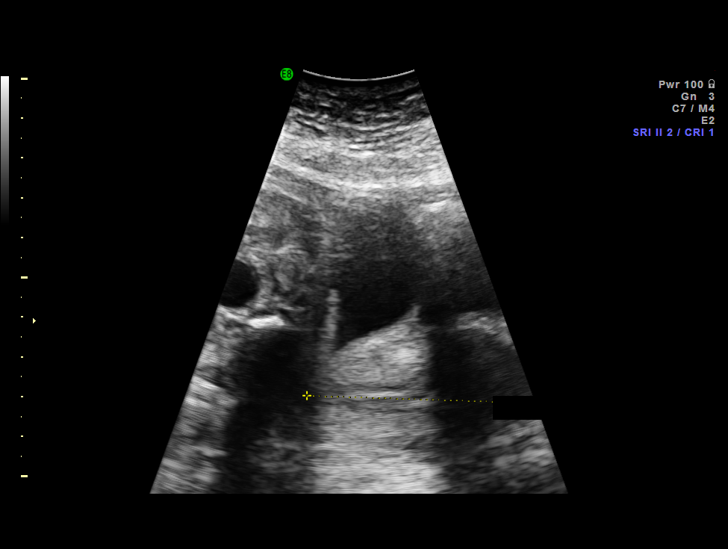
[im 2/10]
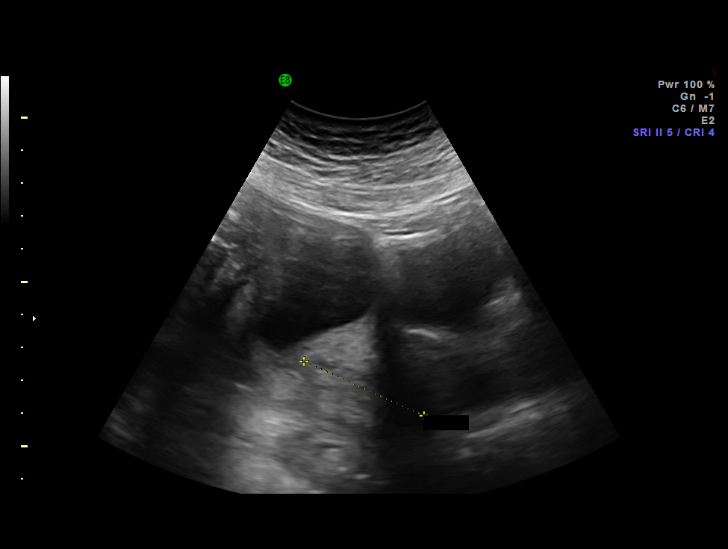
[im 3/10]
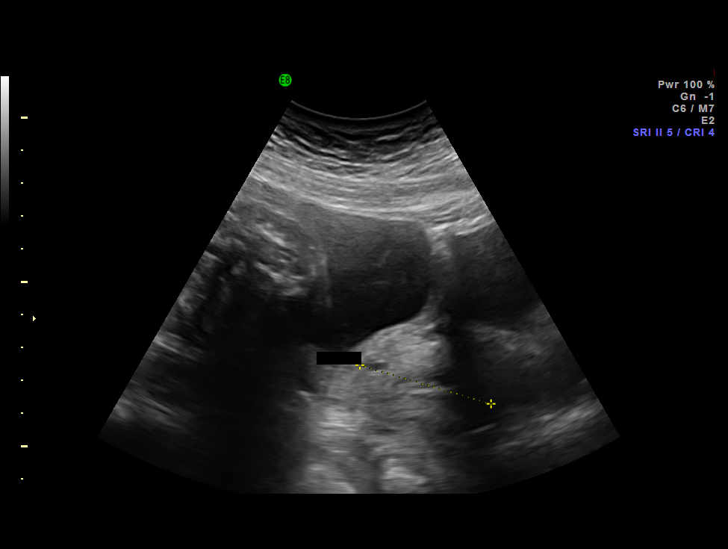
[im 4/10]
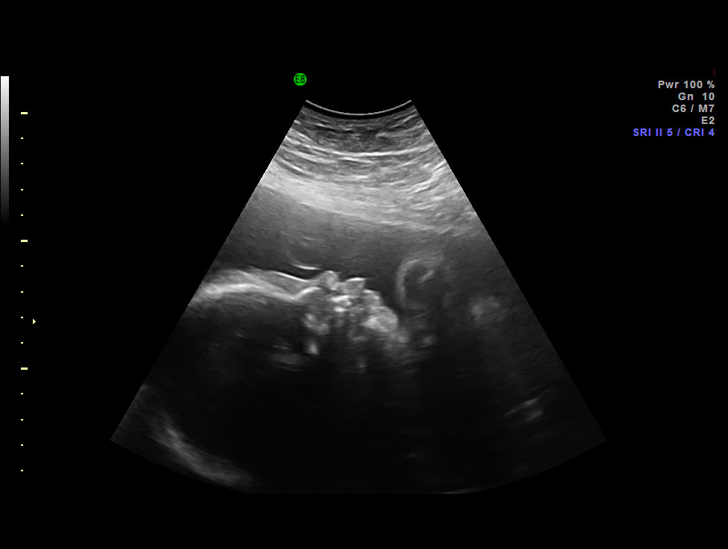
[im 5/10]
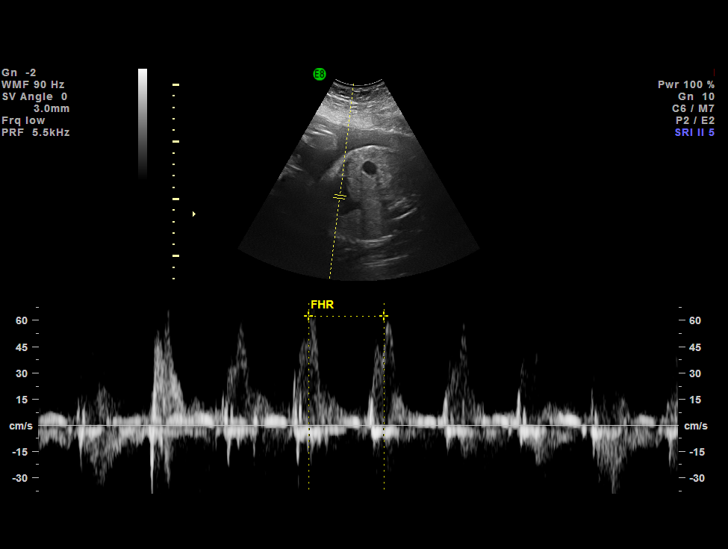
[im 6/10]
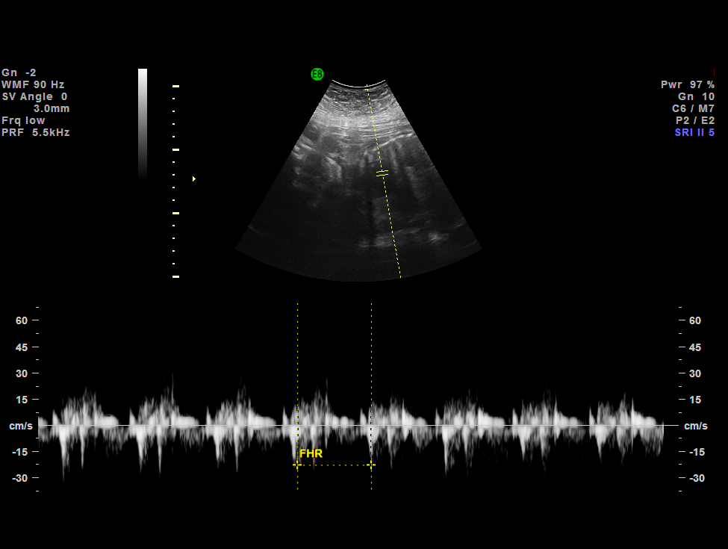
[im 7/10]
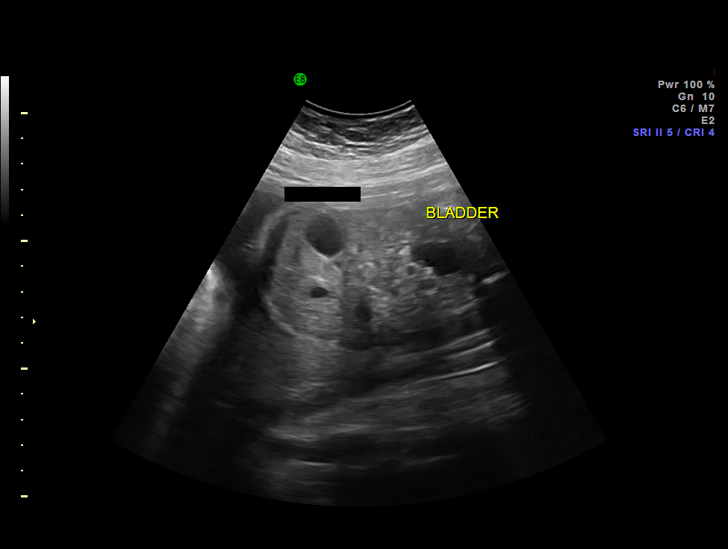
[im 8/10]
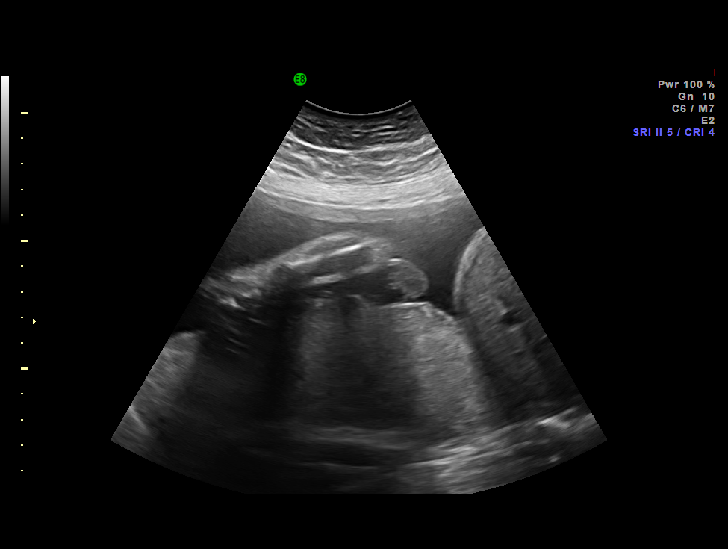
[im 9/10]
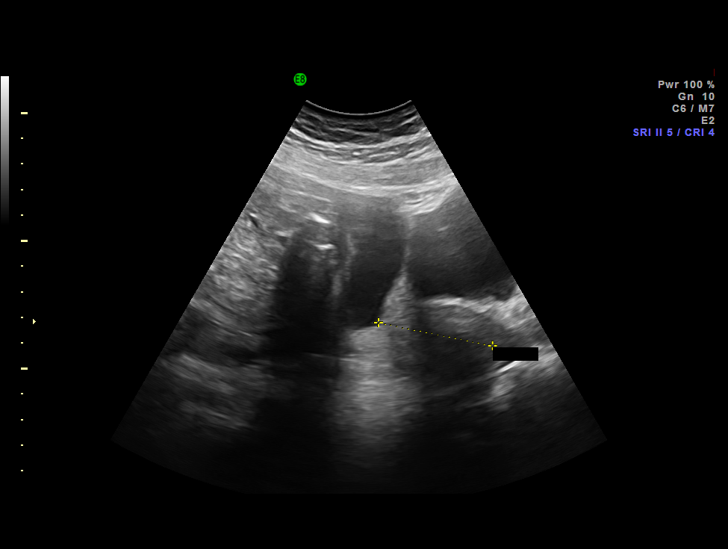
[im 10/10]
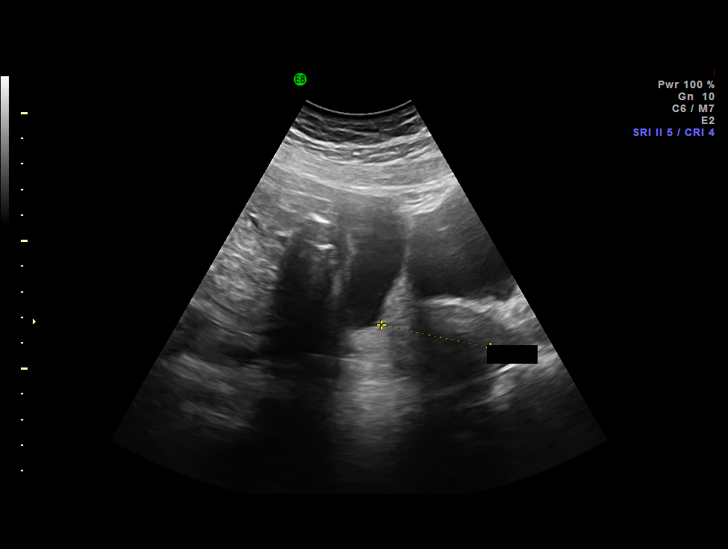

[10 of 10 positions shown; findings below may reference images not displayed]

IMAGES IMPORTED FROM THE SYNGO WORKFLOW SYSTEM
NO DICTATION FOR STUDY

## 2011-09-09 ENCOUNTER — Inpatient Hospital Stay: Payer: Self-pay | Admitting: Obstetrics and Gynecology

## 2011-09-09 LAB — CBC WITH DIFFERENTIAL/PLATELET
Basophil #: 0.1 10*3/uL (ref 0.0–0.1)
Basophil %: 0.6 %
Eosinophil %: 1.2 %
HCT: 34.6 % — ABNORMAL LOW (ref 35.0–47.0)
HGB: 11.7 g/dL — ABNORMAL LOW (ref 12.0–16.0)
Lymphocyte #: 2.8 10*3/uL (ref 1.0–3.6)
Lymphocyte %: 23.2 %
MCH: 30 pg (ref 26.0–34.0)
MCHC: 33.8 g/dL (ref 32.0–36.0)
MCV: 89 fL (ref 80–100)
Monocyte #: 0.8 x10 3/mm (ref 0.2–0.9)
RBC: 3.9 10*6/uL (ref 3.80–5.20)
RDW: 13.2 % (ref 11.5–14.5)
WBC: 11.9 10*3/uL — ABNORMAL HIGH (ref 3.6–11.0)

## 2011-09-09 LAB — DRUG SCREEN, URINE
Amphetamines, Ur Screen: NEGATIVE (ref ?–1000)
Barbiturates, Ur Screen: NEGATIVE (ref ?–200)
Benzodiazepine, Ur Scrn: NEGATIVE (ref ?–200)
Cannabinoid 50 Ng, Ur ~~LOC~~: POSITIVE (ref ?–50)
Cocaine Metabolite,Ur ~~LOC~~: NEGATIVE (ref ?–300)
MDMA (Ecstasy)Ur Screen: NEGATIVE (ref ?–500)
Methadone, Ur Screen: NEGATIVE (ref ?–300)
Opiate, Ur Screen: NEGATIVE (ref ?–300)
Phencyclidine (PCP) Ur S: NEGATIVE (ref ?–25)
Tricyclic, Ur Screen: NEGATIVE (ref ?–1000)

## 2011-09-11 LAB — HEMATOCRIT: HCT: 32.9 % — ABNORMAL LOW (ref 35.0–47.0)

## 2011-09-15 LAB — PATHOLOGY REPORT

## 2013-10-24 ENCOUNTER — Ambulatory Visit: Payer: Self-pay | Admitting: Podiatry

## 2013-11-23 ENCOUNTER — Encounter: Payer: Self-pay | Admitting: Podiatry

## 2013-11-23 ENCOUNTER — Ambulatory Visit (INDEPENDENT_AMBULATORY_CARE_PROVIDER_SITE_OTHER): Payer: PRIVATE HEALTH INSURANCE | Admitting: Podiatry

## 2013-11-23 VITALS — BP 138/93 | HR 62 | Resp 16 | Ht 64.0 in | Wt 215.0 lb

## 2013-11-23 DIAGNOSIS — Q828 Other specified congenital malformations of skin: Secondary | ICD-10-CM

## 2013-11-23 DIAGNOSIS — L851 Acquired keratosis [keratoderma] palmaris et plantaris: Secondary | ICD-10-CM

## 2013-11-23 NOTE — Progress Notes (Signed)
   Subjective:    Patient ID: Nicole Escobar, female    DOB: 12/17/1975, 38 y.o.   MRN: [id number]  HPI Comments: i have a plantar wart on my left heel. Ive had it for 2 months. Its gotten worse. It hurts to walk and stand especially in my work shoes it will bother me. It burns and hurts. i have picked at it and that's it.  Foot Pain      Review of Systems  All other systems reviewed and are negative.      Objective:   Physical Exam: I have reviewed her past medical history medications allergies surgeries social history and review of systems area pulses are strongly palpable bilateral. Neurologic sensorium is intact per Semmes-Weinstein monofilament. Deep tendon reflexes are intact bilateral muscle strength is 5 over 5 dorsiflexors plantar flexors inverters everters all intrinsic musculature is intact. Orthopedic evaluation demonstrates all joints distal to the ankle a full range of motion without crepitation. Soft tissue evaluation demonstrates of porokeratotic lesion centrally located in the plantar left heel. Skin lines do not circumvent the lesion and there are no thrombosed capillaries noted. This is strictly porokeratotic. I see no signs of foreign body.        Assessment & Plan:  Assessment: Porokeratosis left heel.  Plan: Chemical debridement with chemical debridement to follow. Salicylic acid under occlusion. I will followup with her as needed.

## 2013-12-14 ENCOUNTER — Emergency Department: Payer: Self-pay | Admitting: Emergency Medicine

## 2013-12-20 ENCOUNTER — Ambulatory Visit (INDEPENDENT_AMBULATORY_CARE_PROVIDER_SITE_OTHER): Payer: PRIVATE HEALTH INSURANCE | Admitting: Podiatry

## 2013-12-20 VITALS — BP 91/69 | HR 75 | Resp 16

## 2013-12-20 DIAGNOSIS — Q828 Other specified congenital malformations of skin: Secondary | ICD-10-CM

## 2013-12-20 DIAGNOSIS — L03119 Cellulitis of unspecified part of limb: Principal | ICD-10-CM

## 2013-12-20 DIAGNOSIS — L02619 Cutaneous abscess of unspecified foot: Secondary | ICD-10-CM

## 2013-12-20 NOTE — Progress Notes (Signed)
Patient ID: Nicole Escobar, female   DOB: 10/05/1975, 38 y.o.   MRN: [id number]  Subjective: Nicole Escobar, 38 year old female, presents the office today for followup evaluation of left foot cellulitis. At her last appointment she was treated for a porokeratosis on the plantar aspect of her left heel. Since her last appointment she developed cellulitis in the dorsal aspect of her left foot for which she was seen at [facility] and she was prescribed Bextra. She states that before going to the hospital she had redness, swelling, pain to the top of her left foot. Since starting the antibiotic she has noted significant decrease in pain, redness, swelling. Denies any systemic complaints as fevers, chills, nausea, vomiting. No other complaints at this time.  Objective: AAO x3, NAD DP/PT pulses palpable 2/4 b/l, CRT < 3sec Protective sensation intact with Simms Weinstein monofilament. Achilles tendon reflex intact. Vibratory sensation intact. Thin, hyperkeratotic lesion to the plantar aspect of left heel. No tenderness to palpation overlying the area with no surrounding erythema, drainage, malodor, fluctuance, crepitus. Along the dorsal aspect left foot there is evidence of the results cellulitis over the dorsal medial aspect and anterior medial ankle. There is currently no erythema, increased warmth, fluctuance, crepitus, open lesions, drainage to the area. Range of motion of the ankle, subtalar, midfoot pain-free and WNL MMT 5/5, ROM WNL  Assessment: 38 year old female presents for followup evaluation from the emergency department for left foot cellulitis currently appears resolved.  Plan: -Various she options discussed including alternatives, risks, complications. -Discussed the patient to finish her course of Bactrim although symptoms have improved. -Monitor to clinical signs or symptoms of worsening or recurrence. Discussed the patient to call the office immediately if any are to occur or go  directly to the emergency room. -Followup in any further treatment to the left plantar heel porokeratosis at this time. If it becomes symptomatic we'll reevaluate. -Followup as needed. Call the office with any questions, concerns, change in symptoms.

## 2013-12-20 NOTE — Patient Instructions (Addendum)
FINISH COURSE OF ANTIBIOTICS

## 2014-01-09 ENCOUNTER — Emergency Department: Payer: Self-pay | Admitting: Emergency Medicine

## 2014-01-09 IMAGING — CR DG CHEST 2V
1 series · 2 of 2 positions shown · non-contrast
Comparison: None.

CLINICAL DATA: Difficulty breathing. Shortness of breath for 2
weeks along with wheezing and left-sided chest pain. Elevated blood
pressure. History of smoking, quitting 2 years ago.

EXAM:
CHEST  2 VIEW

[Series 1: dxr chest pa (or ap) and lateral · 0.14mm/px · 2 of 2 slices shown]
[im 1/2]
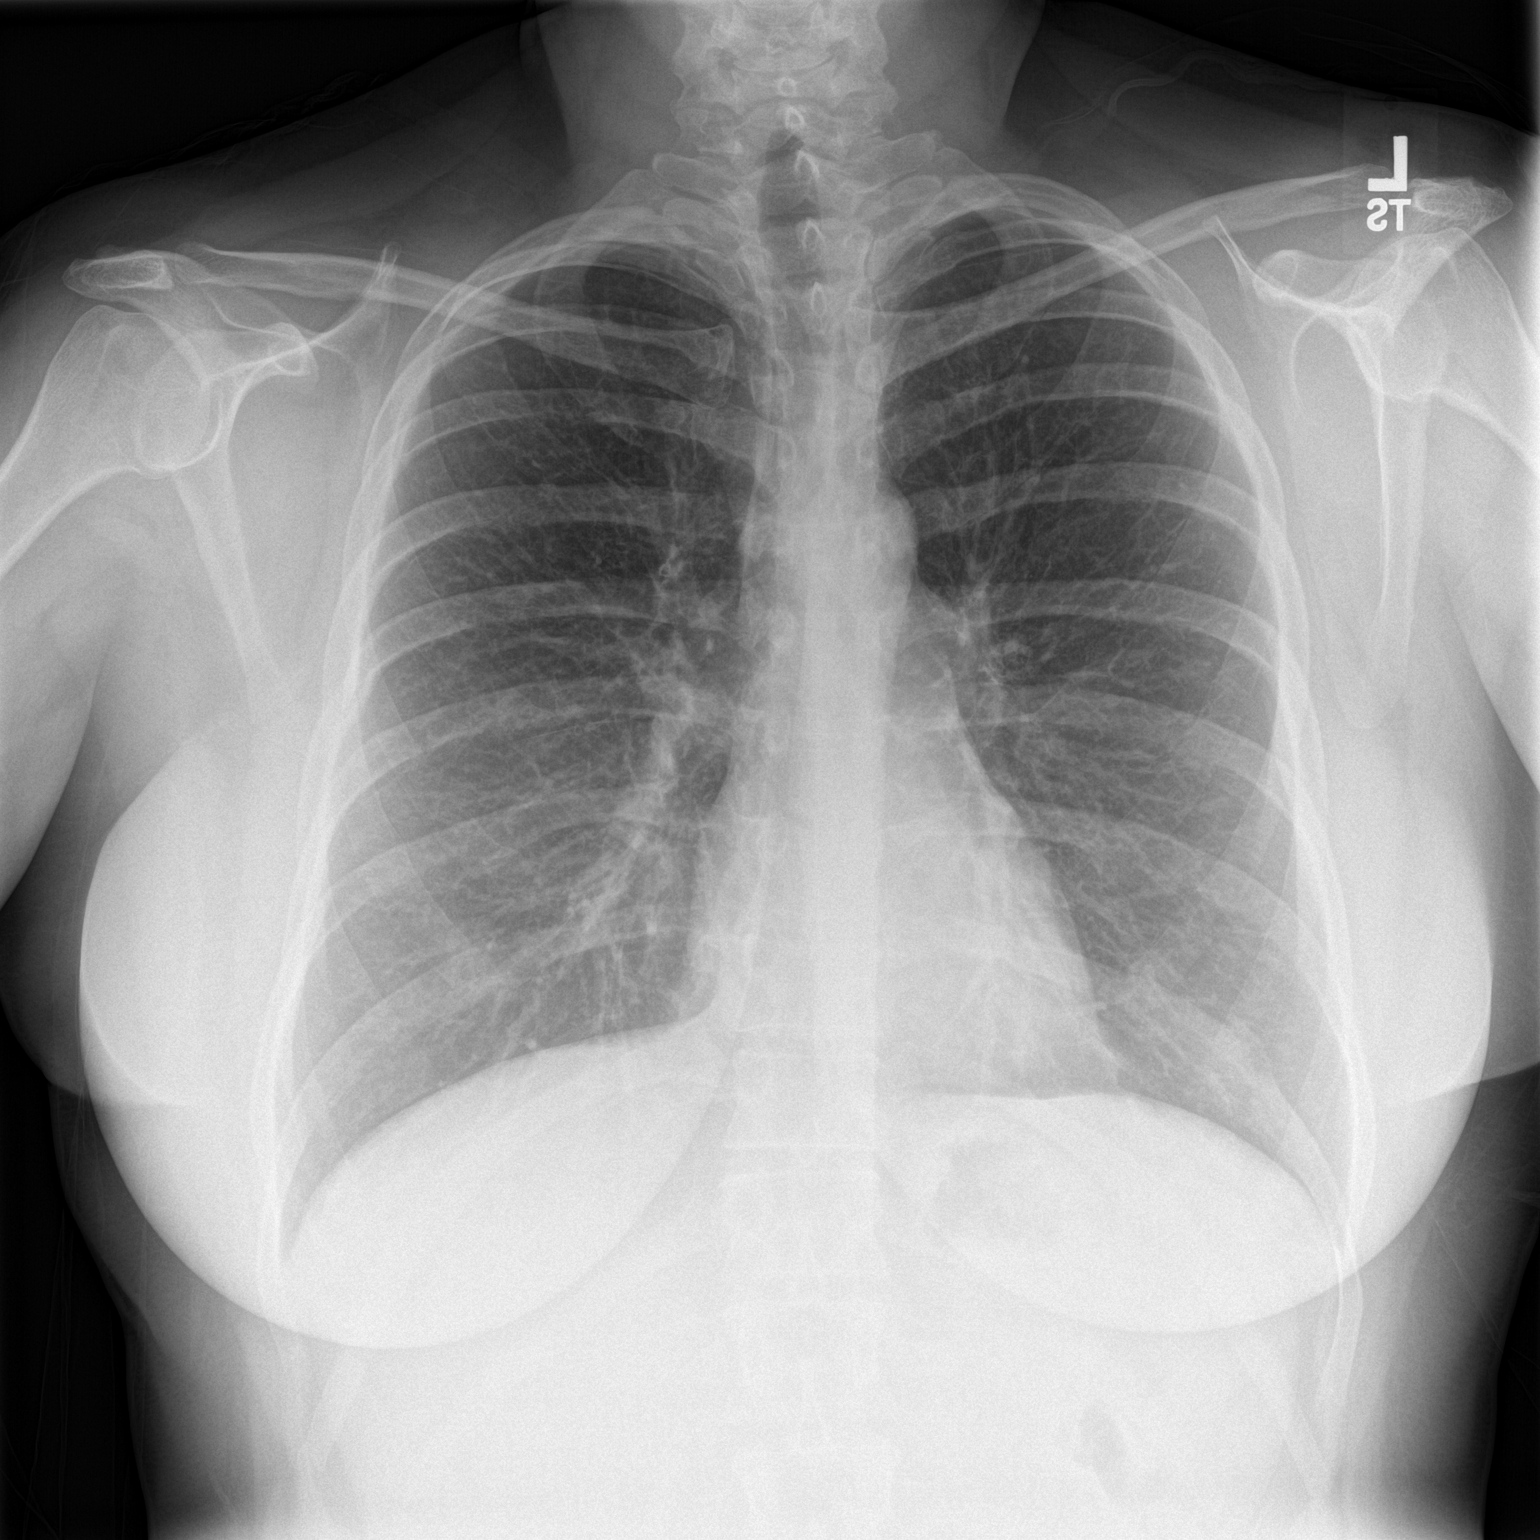
[im 2/2]
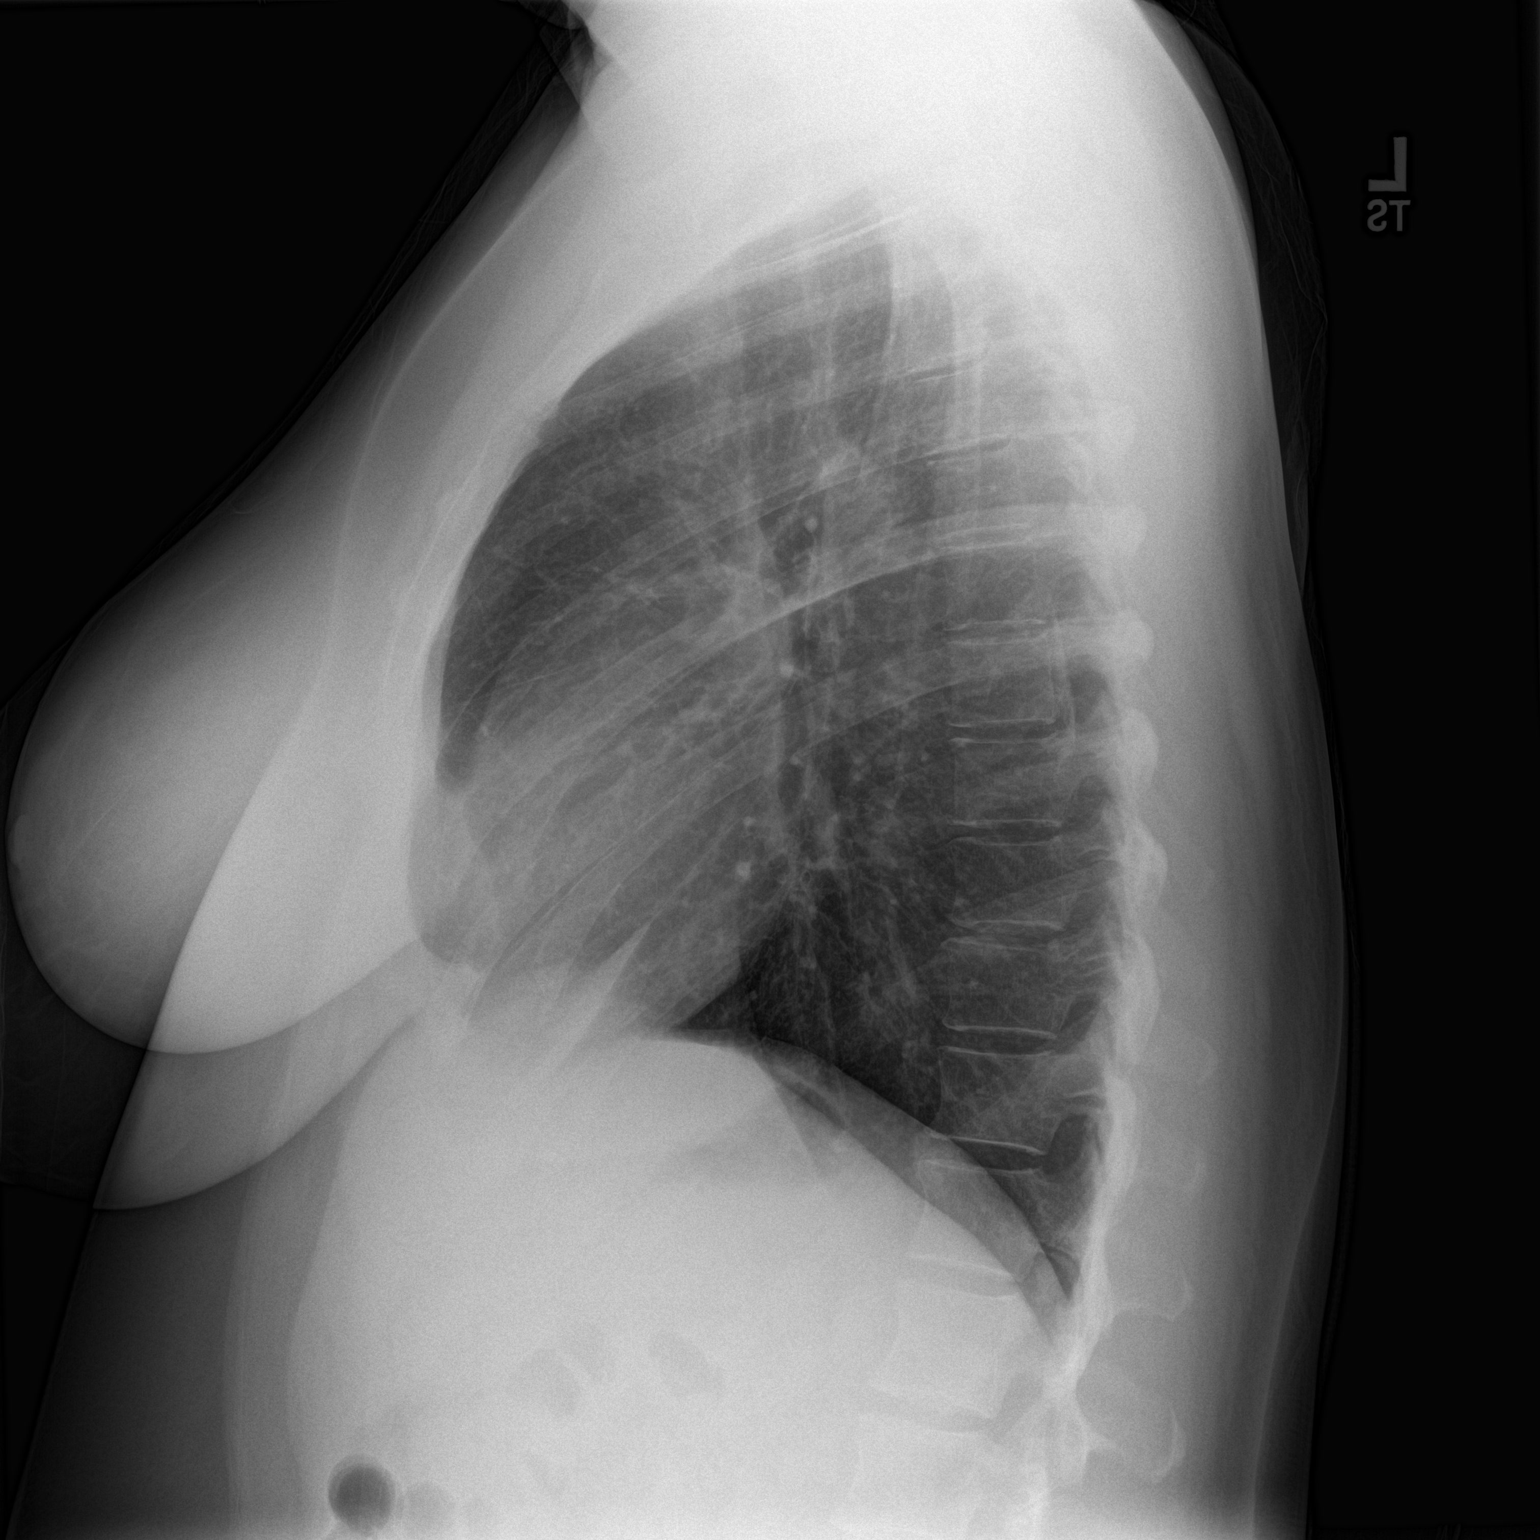

[2 of 2 positions shown; findings below may reference images not displayed]

FINDINGS: Normal heart, mediastinum hila. Clear lungs. No pleural effusion or
pneumothorax.

Bony thorax is unremarkable.
IMPRESSION: No active cardiopulmonary disease.

## 2014-02-09 ENCOUNTER — Emergency Department: Payer: Self-pay | Admitting: Emergency Medicine

## 2014-02-09 IMAGING — CR DG CHEST 2V
1 series · 2 of 2 positions shown · non-contrast
Comparison: None.

CLINICAL DATA: Difficulty breathing, chest discomfort.

EXAM:
CHEST  2 VIEW

[Series 2: w chest pa · 0.14mm/px · 2 of 2 slices shown]
[im 1/2]
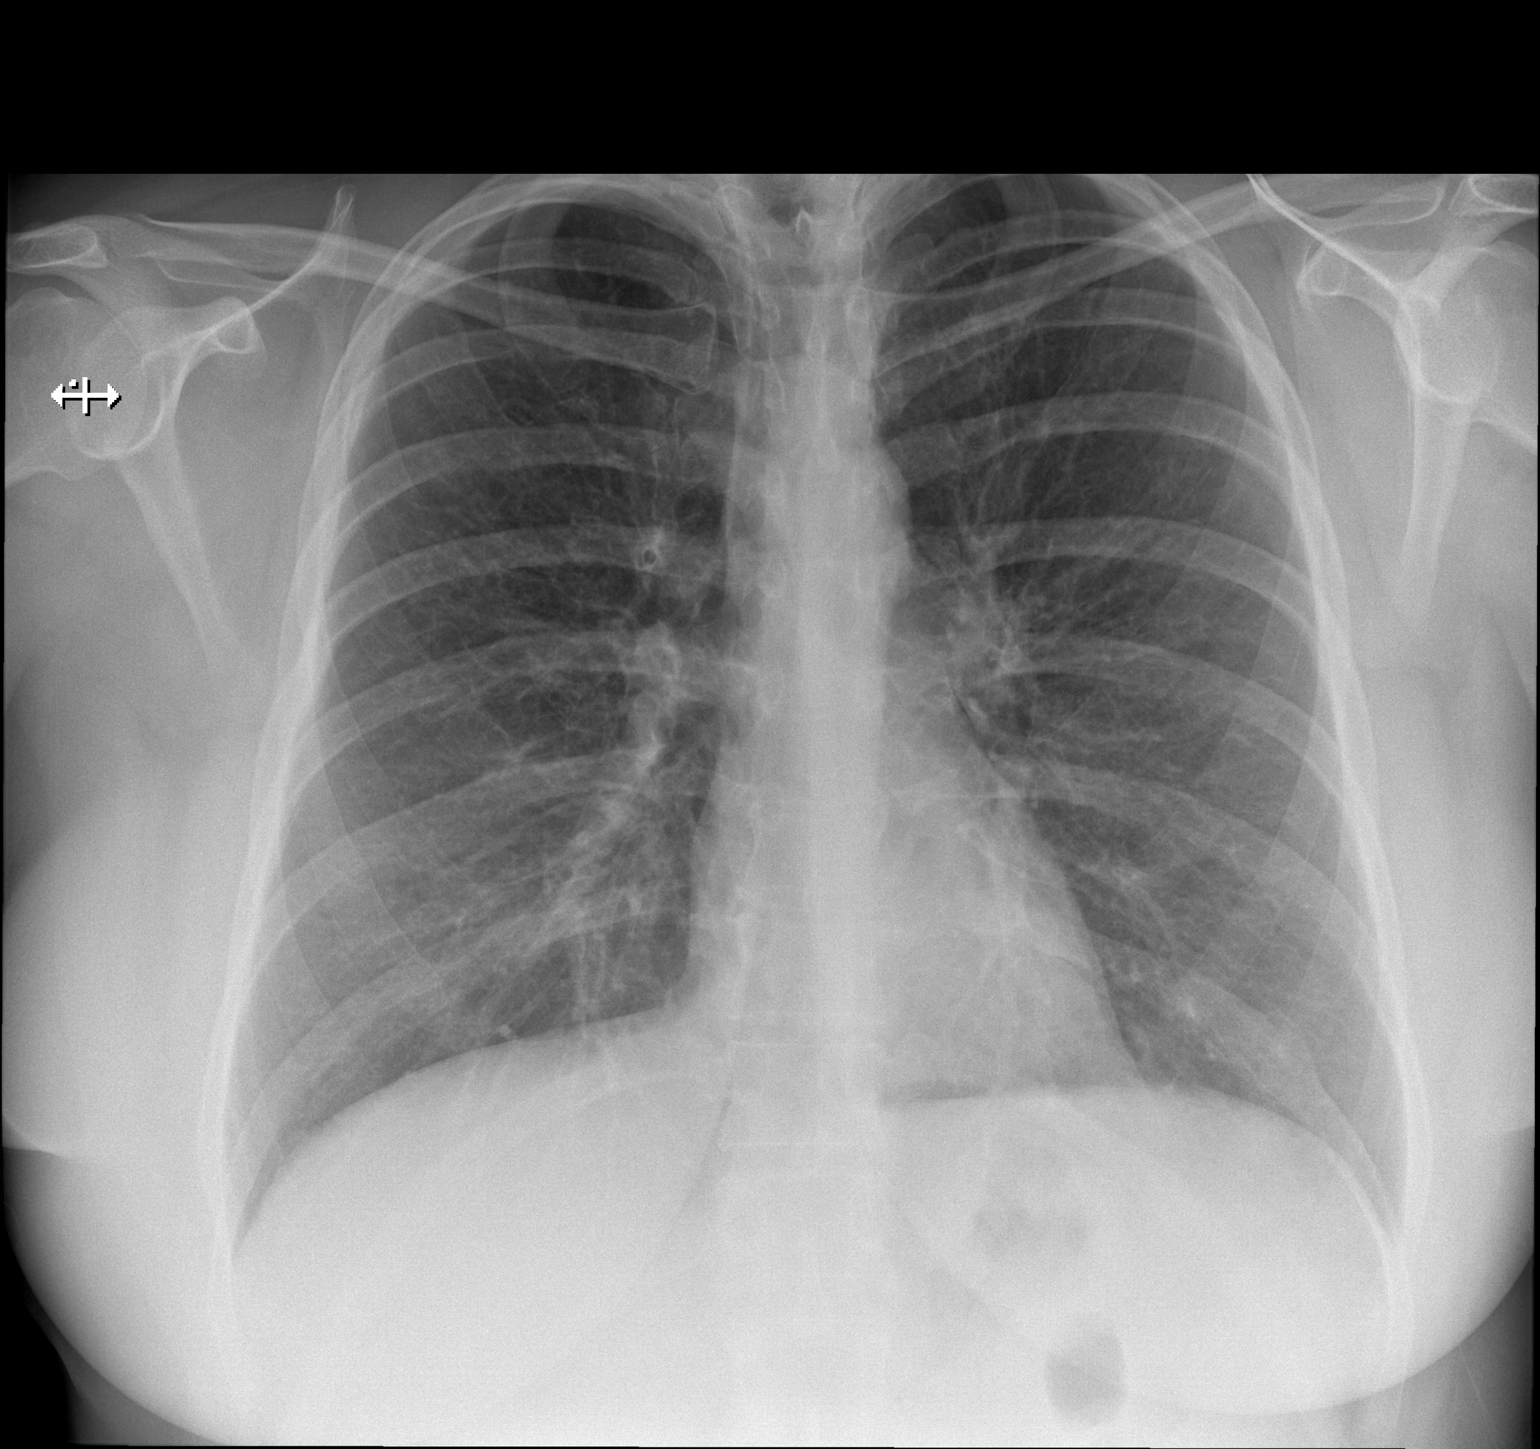
[im 2/2]
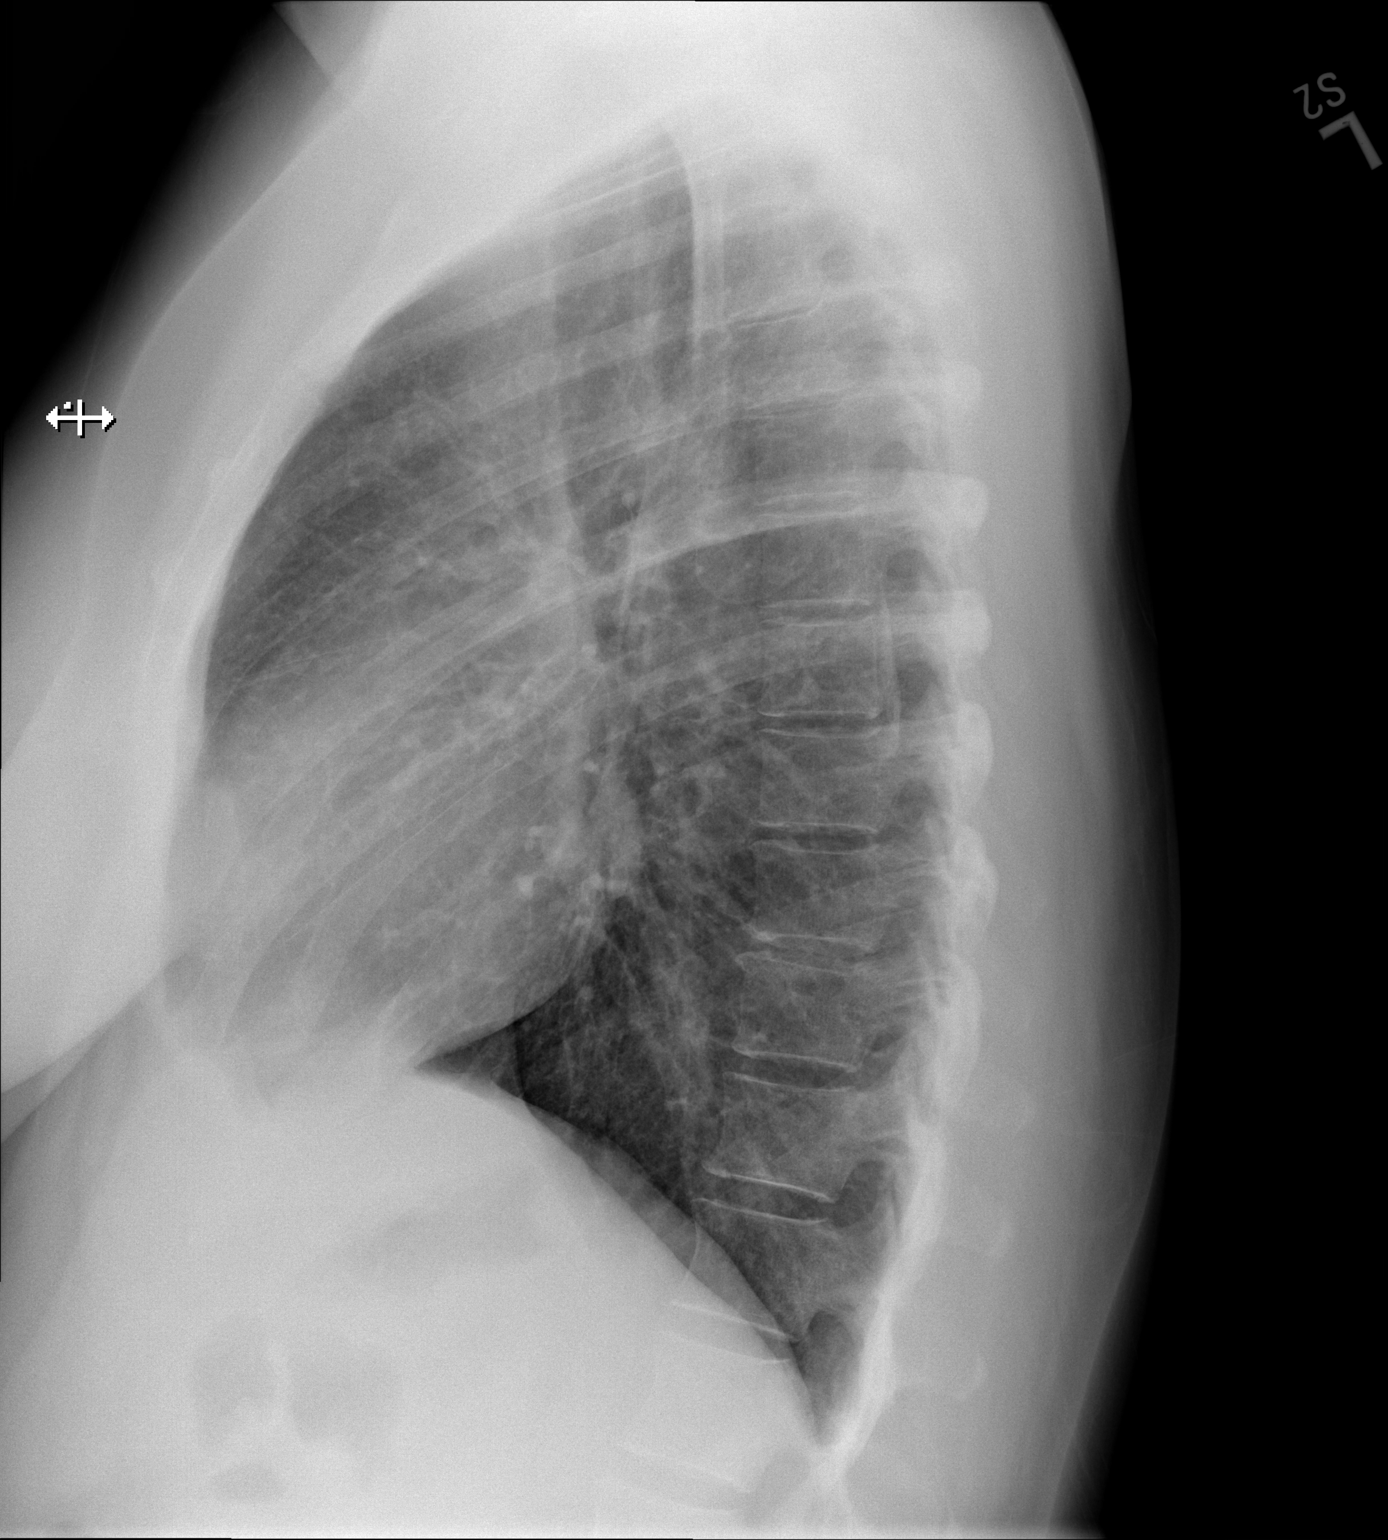

[2 of 2 positions shown; findings below may reference images not displayed]

FINDINGS: Normal mediastinum and cardiac silhouette. Chronic central
bronchitic markings. Normal pulmonary vasculature. No effusion,
infiltrate, or pneumothorax.
IMPRESSION: Central bronchitic markings suggest bronchitis or bronchiolitis.

## 2014-03-15 ENCOUNTER — Ambulatory Visit (INDEPENDENT_AMBULATORY_CARE_PROVIDER_SITE_OTHER): Payer: No Typology Code available for payment source | Admitting: Nurse Practitioner

## 2014-03-15 ENCOUNTER — Encounter: Payer: Self-pay | Admitting: Nurse Practitioner

## 2014-03-15 ENCOUNTER — Encounter (INDEPENDENT_AMBULATORY_CARE_PROVIDER_SITE_OTHER): Payer: Self-pay

## 2014-03-15 VITALS — BP 138/68 | HR 78 | Temp 97.5°F | Resp 14 | Ht 64.0 in | Wt 213.8 lb

## 2014-03-15 DIAGNOSIS — F4541 Pain disorder exclusively related to psychological factors: Secondary | ICD-10-CM

## 2014-03-15 MED ORDER — CYCLOBENZAPRINE HCL 5 MG PO TABS: 5.0000 mg | ORAL_TABLET | Freq: Every day | ORAL | Status: DC

## 2014-03-15 MED ORDER — ALBUTEROL SULFATE HFA 108 (90 BASE) MCG/ACT IN AERS: 2.0000 | INHALATION_SPRAY | Freq: Four times a day (QID) | RESPIRATORY_TRACT | Status: DC | PRN

## 2014-03-15 NOTE — Patient Instructions (Addendum)
Inhaler and muscle relaxer sent in to pharmacy.   We will follow up in 7 weeks.   Call us if you need [place] in the mean time!

## 2014-03-15 NOTE — Progress Notes (Signed)
Pre visit review using our clinic review tool, if applicable. No additional management support is needed unless otherwise documented below in the visit note. 

## 2014-03-15 NOTE — Progress Notes (Signed)
Subjective:    Patient ID: Nicole Escobar, female    DOB: [birth year], 38 y.o.   MRN: [id number]  HPI  Nicole Escobar is a 38 yo female establishing care.   1) 1) Health Maintenance-   Diet- Eats at work Physicist, medical house)   Exercise- 4 38 year old  Immunizations- Refuses   Pap- 2 years ago, abnormal    [facility]  Eye Exam- When younger  Dental Exam- Dental insurance will start shortly   2) Chronic Problems-  COPD- wheezing   3) Acute Problems- Headaches- 02/17/14 last one  Behind eyes to back of head, aches, sore neck.  MJ for 8 years, smoked crack for 10 years- stopped after incarcerated.   ASA- first few days helped   Review of Systems  Constitutional: Negative for fever, chills, diaphoresis, fatigue and unexpected weight change.  HENT: Positive for dental problem.   Eyes: Negative for visual disturbance.  Respiratory: Negative for chest tightness, shortness of breath and wheezing.   Gastrointestinal: Negative for nausea, vomiting, abdominal pain, diarrhea and constipation.  Musculoskeletal: Negative for myalgias.  Skin: Negative for rash.  Neurological: Positive for headaches.   Past Medical History  Diagnosis Date  . Frequent headaches     unknown onset  . Emphysema of lung     History   Social History  . Marital Status: Single    Spouse Name: N/A    Number of Children: N/A  . Years of Education: N/A   Occupational History  . Not on file.   Social History Main Topics  . Smoking status: Former Smoker    Quit date: 03/25/2003  . Smokeless tobacco: Never Used  . Alcohol Use: No  . Drug Use: 0.40 per week    Special: Marijuana  . Sexual Activity:    Partners: Male   Other Topics Concern  . 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 Tubal ligation  2013    Family  History  Problem Relation Age of Onset  . Alcohol abuse Mother   . Arthritis Mother   . Cancer Mother     ovary  . Hyperlipidemia Mother   . Hypertension Mother   . Diabetes Mother   . Arthritis Maternal Grandmother   . Hypertension Maternal Grandmother   . Cancer Maternal Grandmother     colon    Allergies  Allergen Reactions  . Sulfa Antibiotics Rash    No current outpatient prescriptions on file prior to visit.   No current facility-administered medications on file prior to visit.        Objective:   Physical Exam  Constitutional: She is oriented to person, place, and time. She appears well-developed and well-nourished. No distress.  HENT:  Head: Normocephalic and atraumatic.  Right Ear: External ear normal.  Left Ear: External ear normal.  Mouth/Throat: No oropharyngeal exudate.  Dentition is poor. Seeing dentist to fix in 2016  Neck: Normal range of motion. Neck supple. No thyromegaly present.  Cardiovascular: Normal rate and regular rhythm.  Exam reveals no gallop and no friction rub.   No murmur heard. Pulmonary/Chest: Effort normal and breath sounds normal. No respiratory distress. She has no wheezes. She has no rales. She exhibits no tenderness.  Abdominal: Soft. Bowel sounds are normal. She exhibits no distension and no mass. There is no tenderness. There is no rebound and no guarding.  Musculoskeletal: Normal range of motion.  Lymphadenopathy:    She has no cervical adenopathy.  Neurological: She is alert and oriented to person, place, and time.  Skin: Skin is warm and dry. No rash noted. She is not diaphoretic.  Psychiatric: She has a normal mood and affect. Her behavior is normal. Judgment and thought content normal.       Assessment & Plan:

## 2014-03-16 DIAGNOSIS — F4541 Pain disorder exclusively related to psychological factors: Secondary | ICD-10-CM | POA: Insufficient documentation

## 2014-03-16 NOTE — Assessment & Plan Note (Signed)
Stable. Headaches seem to be MSK in nature and stress/tension related. Flexeril 5 mg rx for helping rest and relax after working 3rd shift. Will FU in 7 weeks

## 2014-05-08 ENCOUNTER — Ambulatory Visit: Payer: No Typology Code available for payment source | Admitting: Nurse Practitioner

## 2014-05-15 ENCOUNTER — Encounter: Payer: Self-pay | Admitting: Nurse Practitioner

## 2014-05-15 ENCOUNTER — Ambulatory Visit (INDEPENDENT_AMBULATORY_CARE_PROVIDER_SITE_OTHER): Payer: No Typology Code available for payment source | Admitting: Nurse Practitioner

## 2014-05-15 VITALS — BP 130/82 | HR 68 | Temp 97.5°F | Resp 14 | Ht 64.0 in | Wt 215.0 lb

## 2014-05-15 DIAGNOSIS — G5601 Carpal tunnel syndrome, right upper limb: Secondary | ICD-10-CM

## 2014-05-15 DIAGNOSIS — G5603 Carpal tunnel syndrome, bilateral upper limbs: Secondary | ICD-10-CM

## 2014-05-15 DIAGNOSIS — M79643 Pain in unspecified hand: Secondary | ICD-10-CM

## 2014-05-15 DIAGNOSIS — G5602 Carpal tunnel syndrome, left upper limb: Secondary | ICD-10-CM

## 2014-05-15 DIAGNOSIS — F4541 Pain disorder exclusively related to psychological factors: Secondary | ICD-10-CM

## 2014-05-15 LAB — SEDIMENTATION RATE: Sed Rate: 14 mm/hr (ref 0–22)

## 2014-05-15 MED ORDER — CYCLOBENZAPRINE HCL 10 MG PO TABS: 10.0000 mg | ORAL_TABLET | Freq: Three times a day (TID) | ORAL | Status: DC | PRN

## 2014-05-15 NOTE — Patient Instructions (Signed)
Carpal Tunnel Release Carpal tunnel release is done to relieve the pressure on the nerves and tendons on the bottom side of your wrist.  LET YOUR CAREGIVER KNOW ABOUT:   Allergies to food or medicine.  Medicines taken, including vitamins, herbs, eyedrops, over-the-counter medicines, and creams.  Use of steroids (by mouth or creams).  Previous problems with anesthetics or numbing medicines.  History of bleeding problems or blood clots.  Previous surgery.  Other health problems, including diabetes and kidney problems.  Possibility of pregnancy, if this applies. RISKS AND COMPLICATIONS  Some problems that may happen after this procedure include:  Infection.  Damage to the nerves, arteries or tendons could occur. This would be very uncommon.  Bleeding. BEFORE THE PROCEDURE   This surgery may be done while you are asleep (general anesthetic) or may be done under a block where only your forearm and the surgical area is numb.  If the surgery is done under a block, the numbness will gradually wear off within several hours after surgery. HOME CARE INSTRUCTIONS   Have a responsible person with you for 24 hours.  Do not drive a car or use public transportation for 24 hours.  Only take over-the-counter or prescription medicines for pain, discomfort, or fever as directed by your caregiver. Take them as directed.  You may put ice on the palm side of the affected wrist.  Put ice in a plastic bag.  Place a towel between your skin and the bag.  Leave the ice on for 20 to 30 minutes, 4 times per day.  If you were given a splint to keep your wrist from bending, use it as directed. It is important to wear the splint at night or as directed. Use the splint for as long as you have pain or numbness in your hand, arm, or wrist. This may take 1 to 2 months.  Keep your hand raised (elevated) above the level of your heart as much as possible. This keeps swelling down and helps with  discomfort.  Change bandages (dressings) as directed.  Keep the wound clean and dry. SEEK MEDICAL CARE IF:   You develop pain not relieved with medications.  You develop numbness of your hand.  You develop bleeding from your surgical site.  You have an oral temperature above 102 F (38.9 C).  You develop redness or swelling of the surgical site.  You develop new, unexplained problems. SEEK IMMEDIATE MEDICAL CARE IF:   You develop a rash.  You have difficulty breathing.  You develop any reaction or side effects to medications given. Document Released: 05/31/2003 Document Revised: 06/02/2011 Document Reviewed: 01/14/2007 [facility] Patient Information 2015 [place], [place]. This information is not intended to replace advice given to you by your health care provider. Make sure you discuss any questions you have with your health care provider.

## 2014-05-15 NOTE — Progress Notes (Signed)
Subjective:    Patient ID: Nicole Escobar, female    DOB: February 07, 1976, 39 y.o.   MRN: [id number]  HPI  [name] is a 39 yo female following up for headaches and CC of left hand numbness x 3 weeks.   1) Flexeril was not helpful for headaches, helped for a few minutes. Took tylenol pm- helpful  2) When she was pregnant numbness, burning, pain in both arms/wrists, sleeping poorly because of this. Cooks with both hands, worse at night and after a lot of cooking. Knuckles hurt on left hand and feels tight. Swelling and redness of left hand knuckles. Family history of RA- mother's side. Numbness and tingling especially at night.   Review of Systems  Constitutional: Negative for fever, chills, diaphoresis and fatigue.  Respiratory: Negative for chest tightness, shortness of breath and wheezing.   Cardiovascular: Negative for chest pain, palpitations and leg swelling.  Gastrointestinal: Negative for nausea, vomiting, diarrhea and rectal pain.  Musculoskeletal: Positive for myalgias, joint swelling and arthralgias.  Skin: Negative for rash.  Neurological: Positive for numbness. Negative for dizziness, weakness and headaches.  Psychiatric/Behavioral: The patient is not nervous/anxious.    Past Medical History  Diagnosis Date  . Frequent headaches     unknown onset  . Emphysema of lung     History   Social History  . Marital Status: Single    Spouse Name: N/A  . Number of Children: N/A  . Years of Education: N/A   Occupational History  . Not on file.   Social History Main Topics  . Smoking status: Former Smoker    Quit date: 03/25/2003  . Smokeless tobacco: Never Used  . Alcohol Use: No  . Drug Use: 0.40 per week    Special: Marijuana  . Sexual Activity:    Partners: Male   Other Topics Concern  . 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 Tubal ligation  2013    Family History  Problem Relation Age of Onset  . Alcohol abuse Mother   . Arthritis Mother   . Cancer Mother     ovary  . Hyperlipidemia Mother   . Hypertension Mother   . Diabetes Mother   . Arthritis Maternal Grandmother   . Hypertension Maternal Grandmother   . Cancer Maternal Grandmother     colon    Allergies  Allergen Reactions  . Sulfa Antibiotics Rash    Current Outpatient Prescriptions on File Prior to Visit  Medication Sig Dispense Refill  . albuterol (PROVENTIL HFA;VENTOLIN HFA) 108 (90 BASE) MCG/ACT inhaler Inhale 2 puffs into the lungs every 6 (six) hours as needed for wheezing or shortness of breath. 1 Inhaler 2   No current facility-administered medications on file prior to visit.      Objective:   Physical Exam  Constitutional: She is oriented to person, place, and time. She appears well-developed and well-nourished. No distress.  BP 130/82 mmHg  Pulse 68  Temp(Src) 97.5 F (36.4 C) (Oral)  Resp 14  Ht 5\' 4"  (1.626 m)  Wt 215 lb (97.523 kg)  BMI 36.89 kg/m2  SpO2 97%   HENT:  Head: Normocephalic and atraumatic.  Right Ear: External ear normal.  Left Ear:  External ear normal.  Poor dentition  Eyes: EOM are normal. Pupils are equal, round, and reactive to light. Right eye exhibits no discharge. Left eye exhibits no discharge. No scleral icterus.  Cardiovascular: Normal rate, regular rhythm, normal heart sounds and intact distal pulses.  Exam reveals no gallop and no friction rub.   No murmur heard. Pulmonary/Chest: Effort normal and breath sounds normal. No respiratory distress. She has no wheezes. She has no rales. She exhibits no tenderness.  Musculoskeletal: She exhibits edema and tenderness.  Left hand tenderness more than right. MCPs of left hand erythematous and swollen compared with right, positive phalens and tinels signs.   Neurological: She is alert and  oriented to person, place, and time. No cranial nerve deficit. She exhibits normal muscle tone. Coordination normal.  Skin: Skin is warm and dry. No rash noted. She is not diaphoretic.  Psychiatric: She has a normal mood and affect. Her behavior is normal. Judgment and thought content normal.      Assessment & Plan:

## 2014-05-15 NOTE — Progress Notes (Signed)
Pre visit review using our clinic review tool, if applicable. No additional management support is needed unless otherwise documented below in the visit note. 

## 2014-05-16 DIAGNOSIS — M25549 Pain in joints of unspecified hand: Secondary | ICD-10-CM | POA: Insufficient documentation

## 2014-05-16 DIAGNOSIS — M79641 Pain in right hand: Secondary | ICD-10-CM | POA: Insufficient documentation

## 2014-05-16 DIAGNOSIS — G5603 Carpal tunnel syndrome, bilateral upper limbs: Secondary | ICD-10-CM | POA: Insufficient documentation

## 2014-05-16 LAB — RHEUMATOID FACTOR: Rheumatoid fact SerPl-aCnc: <10 IU/mL (ref <=14)

## 2014-05-16 NOTE — Assessment & Plan Note (Signed)
Stable. Will obtain ESR, RF, and ANA. FU after results.

## 2014-05-16 NOTE — Assessment & Plan Note (Signed)
Will try flexeril 10 mg tablet at night for headaches. FU prn worsening/failure to improve.

## 2014-05-16 NOTE — Assessment & Plan Note (Signed)
Referral to ortho- worsening. Asking about surgical options.

## 2014-05-17 LAB — ANA: Anti Nuclear Antibody(ANA): NEGATIVE

## 2014-07-16 NOTE — Op Note (Signed)
PATIENT NAME:  Nicole Escobar, Nicole Escobar MR#:  [id number] DATE OF BIRTH:  [birth year]  DATE OF PROCEDURE:  09/11/2011  PREOPERATIVE DIAGNOSES:  1. Multiparity. 2. Postpartum, desires permanent sterilization.   POSTOPERATIVE DIAGNOSES: 1. Multiparity. 2. Postpartum, desires permanent sterilization.   PROCEDURE: Postpartum bilateral tubal ligation.   ANESTHESIA: General.   SURGEON: Will Bonnet, MD    ESTIMATED BLOOD LOSS: Minimal.   OPERATIVE FLUIDS: 500 mL Crystalloid.   COMPLICATIONS: None.   FINDINGS: Normal appearing bilateral fallopian tubes.   SPECIMENS: Portion of right and left fallopian tubes to pathology for permanent.   CONDITION: Stable at the end of the procedure.   INDICATIONS: [name] is a 39 year old multiparous female who is postpartum day one from vaginal delivery who desires permanent sterilization. The risks and benefits of the procedure were discussed with her including the risk of failure of 4 in 1000 as well as an increased risk of ectopic pregnancy should pregnancy occur after this procedure compared with had she never had this surgery at all. With these in mind, she strongly desired to move forward with permanent form of sterilization. She was, therefore, taken to the operating room.   PROCEDURE IN DETAIL: The patient was taken to the operating room where general anesthesia was administered and found to be adequate. She was prepped and draped in the usual sterile fashion in the dorsal supine position. A small transverse infraumbilical incision was made after injection of approximately 1 mL of 0.25% Marcaine with epinephrine. The incision was carried down to the level of the fascia which was identified and entered sharply. Once the opening was made, it was found to be free of any adhesions and was then extended laterally. The left fallopian tube was identified and grasped with a Babcock clamp and followed out to the fimbrial portion. Then an approximately 3 cm  segment was suture ligated approximately 3 cm away from the left cornual region. After ligation and removal of the tubal segment, hemostasis was verified. The tube was then returned to the abdomen. In a similar fashion, the right fallopian tube was identified, brought to the opening, and followed out to the fimbrial end and a 3 cm segment of tube was suture ligated approximately 3 cm from the cornual region. Again, hemostasis was verified prior to returning the fallopian tube to the abdomen. The fascia was then closed with #2-0 Vicryl on a UR-6 needle in a running fashion. The skin was closed using 3-0 Vicryl in a subcuticular fashion. Skin was reapproximated with Dermabond. Another 10 mL of local anesthetic, as described above, was then injected for a total of 11 mL.   The patient tolerated the procedure well. Sponge, lap, and needle counts were correct x2. The patient was awakened in the operating room and transported to the recovery area in stable condition.   ____________________________ Will Bonnet, MD sdj:drc D: 09/11/2011 16:01:00 ET T: 09/11/2011 16:50:53 ET JOB#: [id number]  cc: Will Bonnet, MD, <Dictator> Will Bonnet MD ELECTRONICALLY SIGNED 09/12/2011 3:29

## 2014-10-13 ENCOUNTER — Encounter: Payer: Self-pay | Admitting: *Deleted

## 2014-10-13 ENCOUNTER — Other Ambulatory Visit: Payer: Self-pay

## 2014-10-13 DIAGNOSIS — R42 Dizziness and giddiness: Secondary | ICD-10-CM | POA: Insufficient documentation

## 2014-10-13 DIAGNOSIS — Z3202 Encounter for pregnancy test, result negative: Secondary | ICD-10-CM | POA: Insufficient documentation

## 2014-10-13 DIAGNOSIS — Z87891 Personal history of nicotine dependence: Secondary | ICD-10-CM | POA: Insufficient documentation

## 2014-10-13 NOTE — ED Notes (Signed)
Pt ambulatory to triage.  Pt had episode of dizziness yesterday and again tonight while at work.  Pt states everything was foggy and i just don't feel right.  No headache.  Speech clear.  No chest pain or sob.  Skin warm and dry.

## 2014-10-13 NOTE — ED Notes (Signed)
poct urine preg negative.

## 2014-10-14 ENCOUNTER — Emergency Department
Admission: EM | Admit: 2014-10-14 | Discharge: 2014-10-14 | Disposition: A | Discharge status: Home / Self Care | Payer: No Typology Code available for payment source | Attending: Emergency Medicine | Admitting: Emergency Medicine

## 2014-10-14 ENCOUNTER — Emergency Department: Payer: No Typology Code available for payment source

## 2014-10-14 ENCOUNTER — Other Ambulatory Visit: Payer: Self-pay

## 2014-10-14 DIAGNOSIS — R42 Dizziness and giddiness: Secondary | ICD-10-CM

## 2014-10-14 LAB — URINALYSIS COMPLETE WITH MICROSCOPIC (ARMC ONLY)
BILIRUBIN URINE: NEGATIVE
GLUCOSE, UA: NEGATIVE mg/dL
KETONES UR: NEGATIVE mg/dL
Nitrite: NEGATIVE
Protein, ur: NEGATIVE mg/dL
Specific Gravity, Urine: 1.026 (ref 1.005–1.030)
pH: 5 (ref 5.0–8.0)

## 2014-10-14 LAB — CBC
HEMATOCRIT: 41.4 % (ref 35.0–47.0)
Hemoglobin: 13.9 g/dL (ref 12.0–16.0)
MCH: 28.7 pg (ref 26.0–34.0)
MCHC: 33.6 g/dL (ref 32.0–36.0)
MCV: 85.5 fL (ref 80.0–100.0)
Platelets: 248 10*3/uL (ref 150–440)
RBC: 4.84 MIL/uL (ref 3.80–5.20)
RDW: 13.1 % (ref 11.5–14.5)
WBC: 10.2 10*3/uL (ref 3.6–11.0)

## 2014-10-14 LAB — BASIC METABOLIC PANEL
Anion gap: 9 (ref 5–15)
BUN: 12 mg/dL (ref 6–20)
CALCIUM: 8.9 mg/dL (ref 8.9–10.3)
CHLORIDE: 103 mmol/L (ref 101–111)
CO2: 24 mmol/L (ref 22–32)
Creatinine, Ser: 0.87 mg/dL (ref 0.44–1.00)
GFR calc Af Amer: >60 mL/min (ref >60)
GLUCOSE: 106 mg/dL — AB (ref 65–99)
POTASSIUM: 3.8 mmol/L (ref 3.5–5.1)
Sodium: 136 mmol/L (ref 135–145)

## 2014-10-14 IMAGING — CT CT HEAD W/O CM
1 series · 16 of 30 positions shown, 20 images · non-contrast
Comparison: None.

CLINICAL DATA: Recent episode of dizziness, no headaches.

EXAM:
CT HEAD WITHOUT CONTRAST
TECHNIQUE: Contiguous axial images were obtained from the base of the skull
through the vertex without intravenous contrast.

[Series 2: head wo · axial · 0.42mm/px · z∈[-55,+89]mm · 16 of 36 slices shown, 20 images]
[im 2/36  brain]
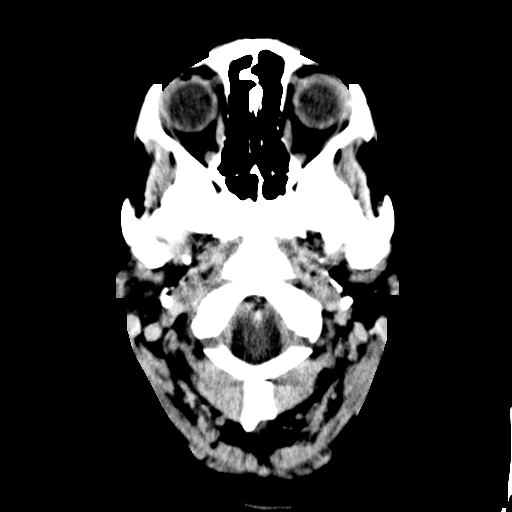
[im 2/36  bone]
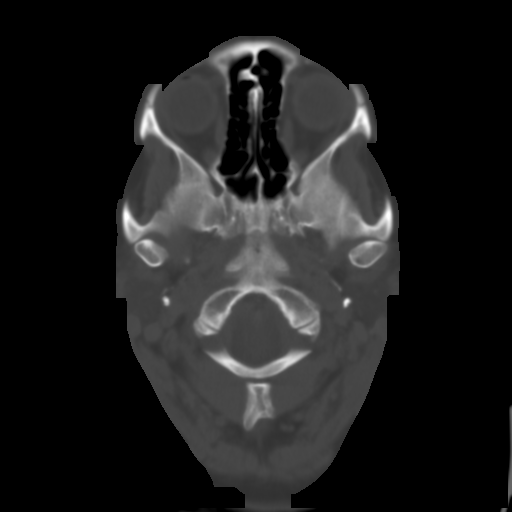
[im 4/36  brain]
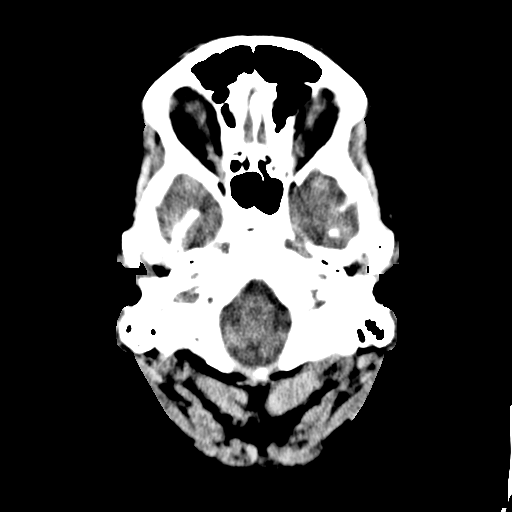
[im 7/36  brain]
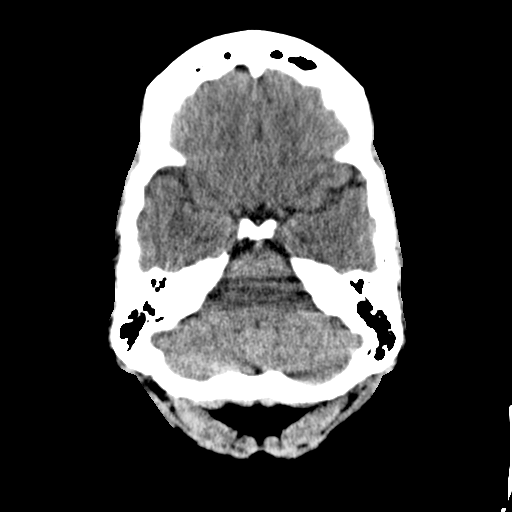
[im 9/36  brain]
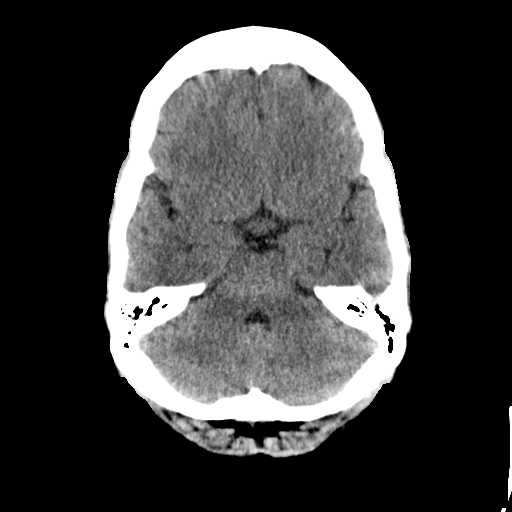
[im 10/36  brain]
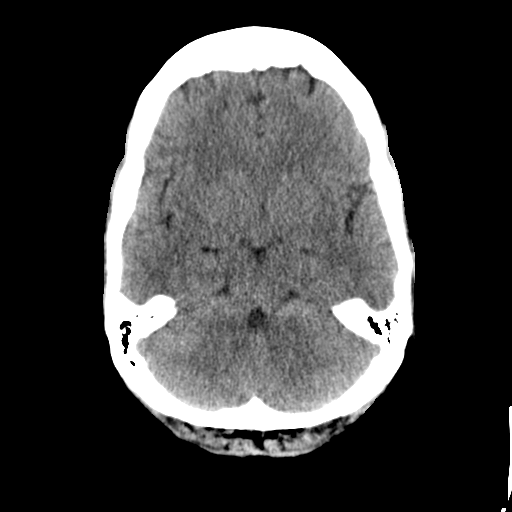
[im 10/36  bone]
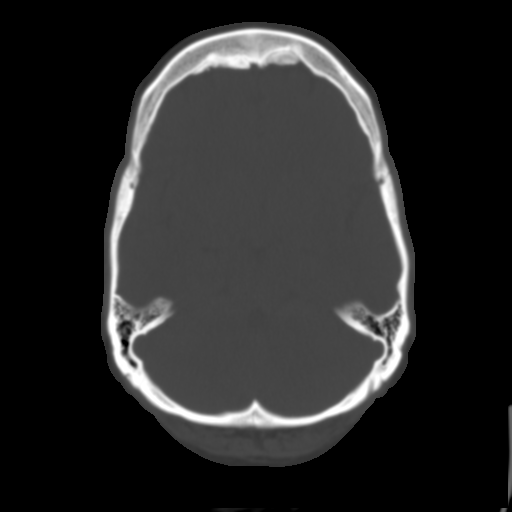
[im 13/36  brain]
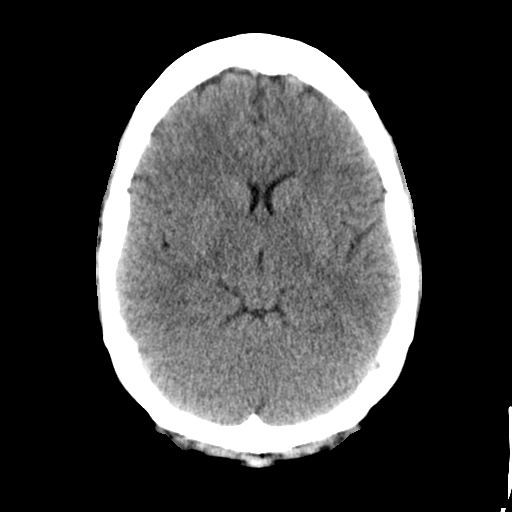
[im 15/36  brain]
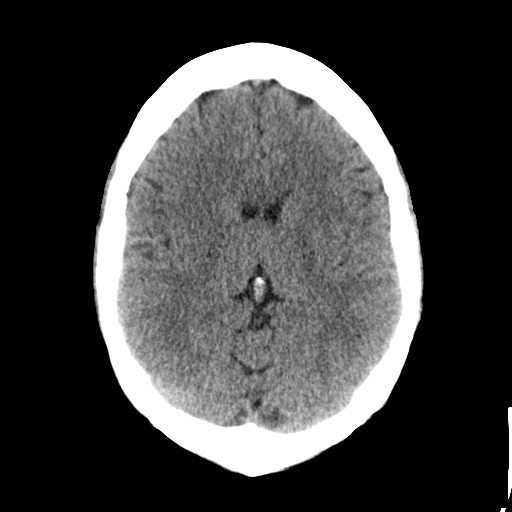
[im 17/36  brain]
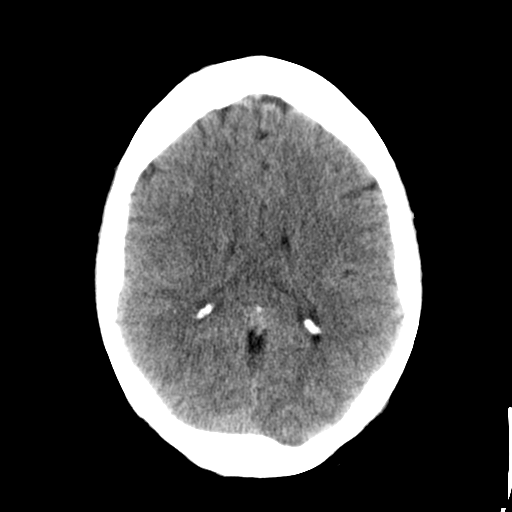
[im 19/36  brain]
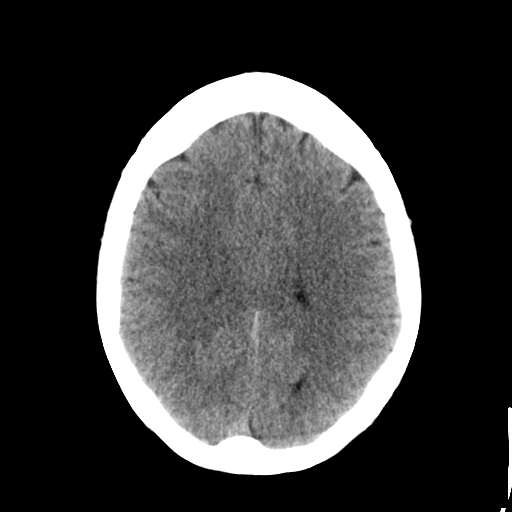
[im 19/36  bone]
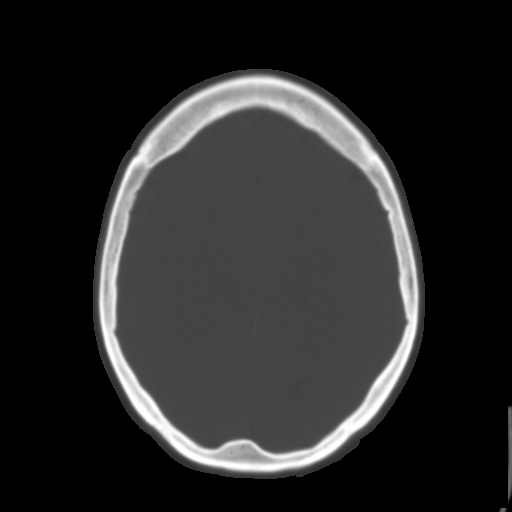
[im 21/36  brain]
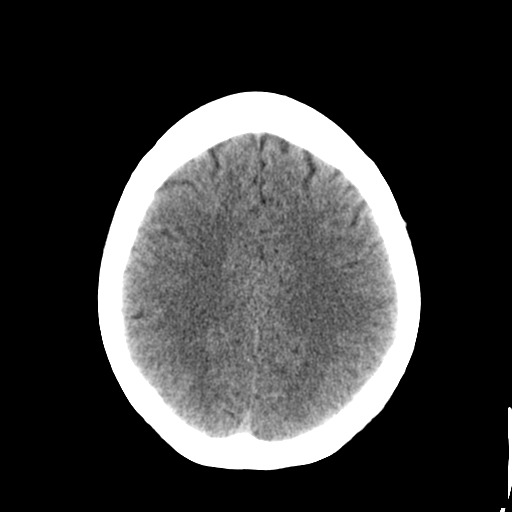
[im 23/36  brain]
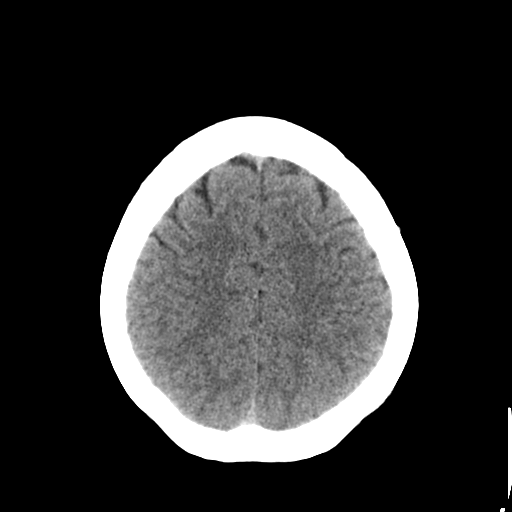
[im 26/36  brain]
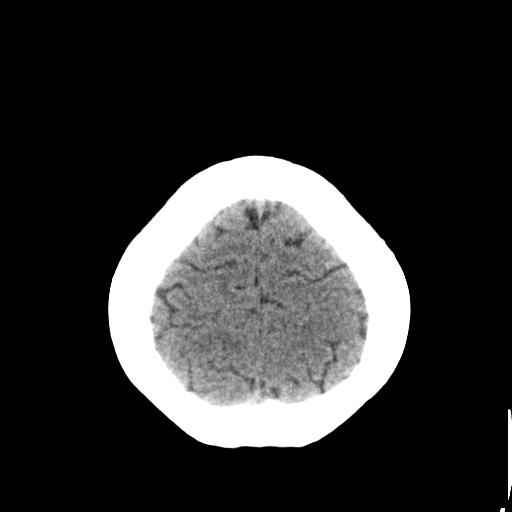
[im 27/36  brain]
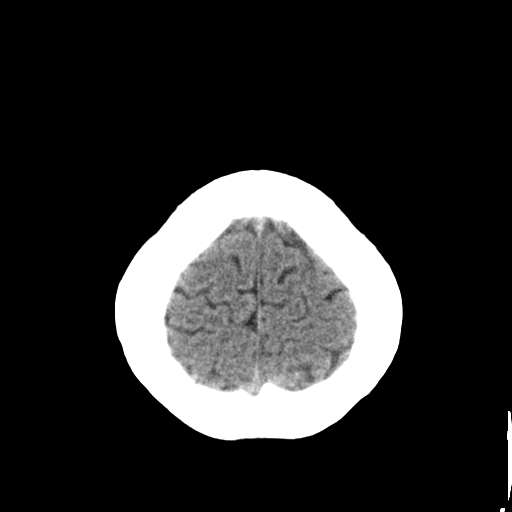
[im 27/36  bone]
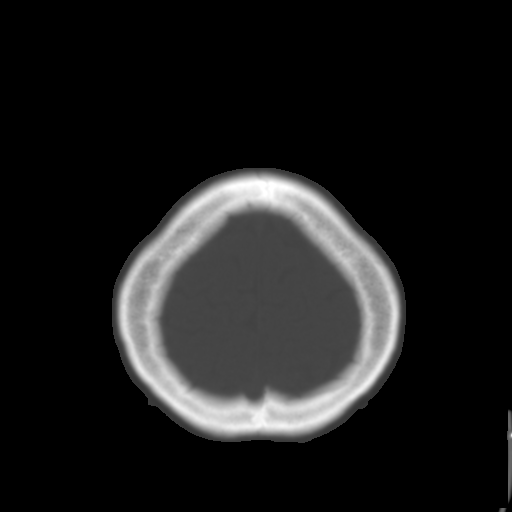
[im 29/36  brain]
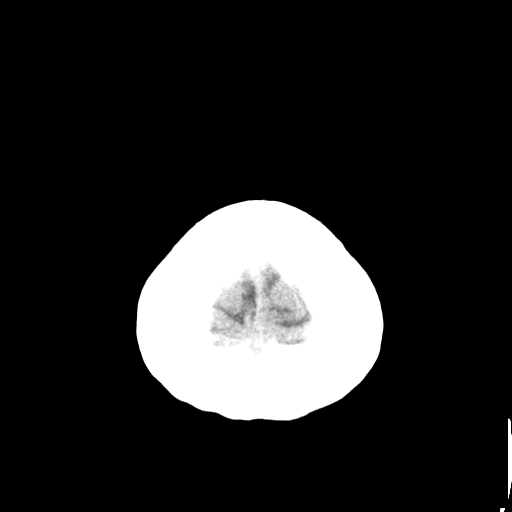
[im 32/36  brain]
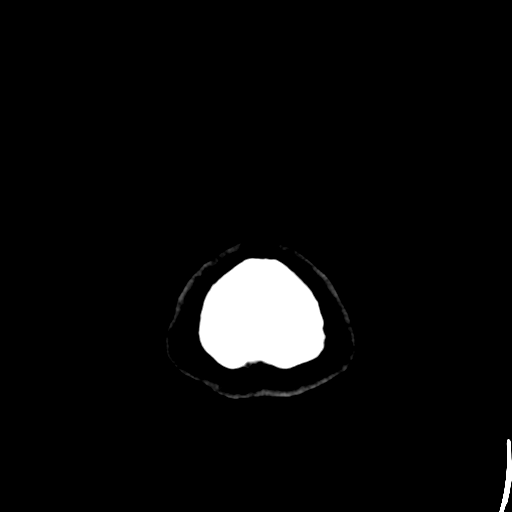
[im 34/36  brain]
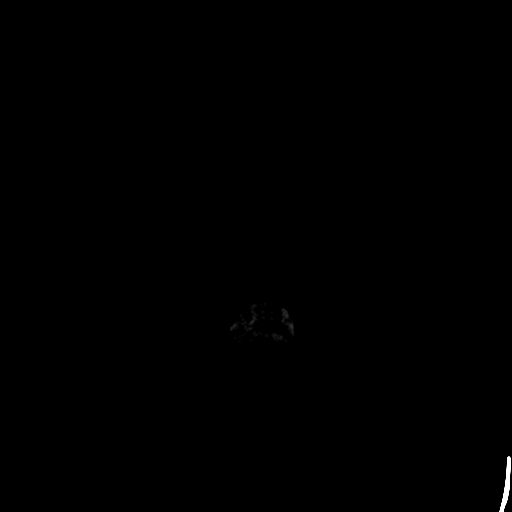

[16 of 30 positions shown; findings below may reference images not displayed]

FINDINGS: The bony calvarium is intact. The ventricles are of normal size and
configuration. No findings to suggest acute hemorrhage, acute
infarction or space-occupying mass lesion are noted.
IMPRESSION: No acute intracranial abnormality noted.

## 2014-10-14 MED ORDER — MECLIZINE HCL 25 MG PO TABS: 25.0000 mg | ORAL_TABLET | Freq: Three times a day (TID) | ORAL | Status: DC | PRN

## 2014-10-14 MED ORDER — MECLIZINE HCL 25 MG PO TABS
25.0000 mg | ORAL_TABLET | Freq: Once | ORAL | Status: AC
  Administered 2014-10-14: 25 mg via ORAL
  Filled 2014-10-14: qty 1

## 2014-10-14 MED ORDER — SODIUM CHLORIDE 0.9 % IV BOLUS (SEPSIS)
1000.0000 mL | Freq: Once | INTRAVENOUS | Status: AC
  Administered 2014-10-14: 1000 mL via INTRAVENOUS

## 2014-10-14 NOTE — ED Notes (Signed)
Patient reports that Thursday that she felt dizzy. Patient reports that it lasted for a couple of minutes. Patient states that she has another episode of dizziness Friday night at work.

## 2014-10-14 NOTE — Discharge Instructions (Signed)
Dizziness °Dizziness is a common problem. It is a feeling of unsteadiness or light-headedness. You may feel like you are about to faint. Dizziness can lead to injury if you stumble or fall. A person of any age group can suffer from dizziness, but dizziness is more common in older adults. °CAUSES  °Dizziness can be caused by many different things, including: °· Middle ear problems. °· Standing for too long. °· Infections. °· An allergic reaction. °· Aging. °· An emotional response to something, such as the sight of blood. °· Side effects of medicines. °· Tiredness. °· Problems with circulation or blood pressure. °· Excessive use of alcohol or medicines, or illegal drug use. °· Breathing too fast (hyperventilation). °· An irregular heart rhythm (arrhythmia). °· A low red blood cell count (anemia). °· Pregnancy. °· Vomiting, diarrhea, fever, or other illnesses that cause body fluid loss (dehydration). °· Diseases or conditions such as Parkinson's disease, high blood pressure (hypertension), diabetes, and thyroid problems. °· Exposure to extreme heat. °DIAGNOSIS  °Your health care provider will ask about your symptoms, perform a physical exam, and perform an electrocardiogram (ECG) to record the electrical activity of your heart. Your health care provider may also perform other heart or blood tests to determine the cause of your dizziness. These may include: °· Transthoracic echocardiogram (TTE). During echocardiography, sound waves are used to evaluate how blood flows through your heart. °· Transesophageal echocardiogram (TEE). °· Cardiac monitoring. This allows your health care provider to monitor your heart rate and rhythm in real time. °· Holter monitor. This is a portable device that records your heartbeat and can help diagnose heart arrhythmias. It allows your health care provider to track your heart activity for several days if needed. °· Stress tests by exercise or by giving medicine that makes the heart beat  faster. °TREATMENT  °Treatment of dizziness depends on the cause of your symptoms and can vary greatly. °HOME CARE INSTRUCTIONS  °· Drink enough fluids to keep your urine clear or pale yellow. This is especially important in very hot weather. In older adults, it is also important in cold weather. °· Take your medicine exactly as directed if your dizziness is caused by medicines. When taking blood pressure medicines, it is especially important to get up slowly. °· Rise slowly from chairs and steady yourself until you feel okay. °· In the morning, first sit up on the side of the bed. When you feel okay, stand slowly while holding onto something until you know your balance is fine. °· Move your legs often if you need to stand in one place for a long time. Tighten and relax your muscles in your legs while standing. °· Have someone stay with you for 1-2 days if dizziness continues to be a problem. Do this until you feel you are well enough to stay alone. Have the person call your health care provider if he or she notices changes in you that are concerning. °· Do not drive or use heavy machinery if you feel dizzy. °· Do not drink alcohol. °SEEK IMMEDIATE MEDICAL CARE IF:  °· Your dizziness or light-headedness gets worse. °· You feel nauseous or vomit. °· You have problems talking, walking, or using your arms, hands, or legs. °· You feel weak. °· You are not thinking clearly or you have trouble forming sentences. It may take a friend or family member to notice this. °· You have chest pain, abdominal pain, shortness of breath, or sweating. °· Your vision changes. °· You notice   any bleeding. °· You have side effects from medicine that seems to be getting worse rather than better. °MAKE SURE YOU:  °· Understand these instructions. °· Will watch your condition. °· Will get help right away if you are not doing well or get worse. °Document Released: 09/03/2000 Document Revised: 03/15/2013 Document Reviewed: 09/27/2010 °ExitCare®  Patient Information ©2015 [employer]. This information is not intended to replace advice given to you by your health care provider. Make sure you discuss any questions you have with your health care provider. ° ° ° °Vertigo °Vertigo means you feel like you or your surroundings are moving when they are not. Vertigo can be dangerous if it occurs when you are at work, driving, or performing difficult activities.  °CAUSES  °Vertigo occurs when there is a conflict of signals sent to your brain from the visual and sensory systems in your body. There are many different causes of vertigo, including: °· Infections, especially in the inner ear. °· A bad reaction to a drug or misuse of alcohol and medicines. °· Withdrawal from drugs or alcohol. °· Rapidly changing positions, such as lying down or rolling over in bed. °· A migraine headache. °· Decreased blood flow to the brain. °· Increased pressure in the brain from a head injury, infection, tumor, or bleeding. °SYMPTOMS  °You may feel as though the world is spinning around or you are falling to the ground. Because your balance is upset, vertigo can cause nausea and vomiting. You may have involuntary eye movements (nystagmus). °DIAGNOSIS  °Vertigo is usually diagnosed by physical exam. If the cause of your vertigo is unknown, your caregiver may perform imaging tests, such as an MRI scan (magnetic resonance imaging). °TREATMENT  °Most cases of vertigo resolve on their own, without treatment. Depending on the cause, your caregiver may prescribe certain medicines. If your vertigo is related to body position issues, your caregiver may recommend movements or procedures to correct the problem. In rare cases, if your vertigo is caused by certain inner ear problems, you may need surgery. °HOME CARE INSTRUCTIONS  °· Follow your caregiver's instructions. °· Avoid driving. °· Avoid operating heavy machinery. °· Avoid performing any tasks that would be dangerous to you or others  during a vertigo episode. °· Tell your caregiver if you notice that certain medicines seem to be causing your vertigo. Some of the medicines used to treat vertigo episodes can actually make them worse in some people. °SEEK IMMEDIATE MEDICAL CARE IF:  °· Your medicines do not relieve your vertigo or are making it worse. °· You develop problems with talking, walking, weakness, or using your arms, hands, or legs. °· You develop severe headaches. °· Your nausea or vomiting continues or gets worse. °· You develop visual changes. °· A family member notices behavioral changes. °· Your condition gets worse. °MAKE SURE YOU: °· Understand these instructions. °· Will watch your condition. °· Will get help right away if you are not doing well or get worse. °Document Released: 12/18/2004 Document Revised: 06/02/2011 Document Reviewed: 09/26/2010 °ExitCare® Patient Information ©2015 [employer]. This information is not intended to replace advice given to you by your health care provider. Make sure you discuss any questions you have with your health care provider. ° °

## 2014-10-14 NOTE — ED Provider Notes (Signed)
[facility] Emergency Department Provider Note ____________________________________________  Time seen: Approximately 5:03 AM  I have reviewed the triage vital signs and the nursing notes.   HISTORY  Chief Complaint Dizziness   HPI Nicole Escobar is a 39 y.o. female who comes in with dizziness that started 2 days ago. The patient reports that initially when she woke up and started she started feeling dizzy. She reports that it felt as though she had some tunnel vision and it took over a minute for the symptoms to improve. The patient reports that she felt weird all day after she had this specific episode. She reports that she felt very anxious and shaky. She reports that yesterday she felt fine during the day but when she was at work she turned her head and started feeling as if the room was spinning again. She reports that she stop exactly what she was doing and decided to come in for evaluation. The patient reports that she drinks soda and does not drink a whole lot of water. She reports though that none of her lifestyle habits have changed recently. The patient has had some mild shortness of breath some nausea vomiting and felt as though she needed to have a bowel movement. She denies any chest pain any headache any blurred vision.   Past Medical History  Diagnosis Date  . Frequent headaches     unknown onset  . Emphysema of lung     Patient Active Problem List   Diagnosis Date Noted  . Hand joint pain 05/16/2014  . Bilateral carpal tunnel syndrome 05/16/2014  . Stress headaches 03/16/2014    Past Surgical History  Procedure Laterality Date  . Tubal ligation  2013    Current Outpatient Rx  Name  Route  Sig  Dispense  Refill  . albuterol (PROVENTIL HFA;VENTOLIN HFA) 108 (90 BASE) MCG/ACT inhaler   Inhalation   Inhale 2 puffs into the lungs every 6 (six) hours as needed for wheezing or shortness of breath.   1 Inhaler   2   . cyclobenzaprine  (FLEXERIL) 10 MG tablet   Oral   Take 1 tablet (10 mg total) by mouth 3 (three) times daily as needed for muscle spasms.   30 tablet   0   . meclizine (ANTIVERT) 25 MG tablet   Oral   Take 1 tablet (25 mg total) by mouth 3 (three) times daily as needed for dizziness or nausea.   30 tablet   0     Allergies Sulfa antibiotics  Family History  Problem Relation Age of Onset  . Alcohol abuse Mother   . Arthritis Mother   . Cancer Mother     ovary  . Hyperlipidemia Mother   . Hypertension Mother   . Diabetes Mother   . Arthritis Maternal Grandmother   . Hypertension Maternal Grandmother   . Cancer Maternal Grandmother     colon    Social History History  Substance Use Topics  . Smoking status: Former Smoker    Quit date: 03/25/2003  . Smokeless tobacco: Never Used  . Alcohol Use: No    Review of Systems Constitutional: No fever/chills Eyes: No visual changes. ENT: No sore throat. Cardiovascular: Denies chest pain. Respiratory:  shortness of breath. Gastrointestinal: No abdominal pain.  No nausea, no vomiting.  No diarrhea.  No constipation. Genitourinary: Negative for dysuria. Musculoskeletal: Negative for back pain. Skin: Negative for rash. Neurological: Dizziness  10-point ROS otherwise negative.  ____________________________________________   PHYSICAL  EXAM:  VITAL SIGNS: ED Triage Vitals  Enc Vitals Group     BP 10/13/14 2334 157/61 mmHg     Pulse Rate 10/13/14 2334 76     Resp 10/13/14 2334 20     Temp 10/13/14 2334 98.2 F (36.8 C)     Temp Source 10/13/14 2334 Oral     SpO2 10/13/14 2334 98 %     Weight 10/13/14 2334 200 lb (90.719 kg)     Height 10/13/14 2334 5\' 4"  (1.626 m)     Head Cir --      Peak Flow --      Pain Score --      Pain Loc --      Pain Edu? --      Excl. in [place]? --    \ Constitutional: Alert and oriented. Well appearing and in no acute distress. Eyes: Conjunctivae are normal. PERRL. EOMI. Head: Atraumatic. Nose: No  congestion/rhinnorhea. Ears: Small amount of fluid behind right TM no erythema and no bulging of bilateral TMs. Mouth/Throat: Mucous membranes are moist.  Oropharynx non-erythematous. Cardiovascular: Normal rate, regular rhythm. Grossly normal heart sounds.  Good peripheral circulation. Respiratory: Normal respiratory effort.  No retractions. Lungs CTAB. Gastrointestinal: Soft and nontender. No distention. Positive bowel sounds. Genitourinary: Deferred Musculoskeletal: No lower extremity tenderness nor edema.   Neurologic:  Normal speech and language. No gross focal neurologic deficits are appreciated.  Skin:  Skin is warm, dry and intact. No rash noted. Psychiatric: Mood and affect are normal.   ____________________________________________   LABS (all labs ordered are listed, but only abnormal results are displayed)  Labs Reviewed  BASIC METABOLIC PANEL - Abnormal; Notable for the following:    Glucose, Bld 106 (*)    All other components within normal limits  URINALYSIS COMPLETEWITH MICROSCOPIC ([facility] ONLY) - Abnormal; Notable for the following:    Color, Urine YELLOW (*)    APPearance HAZY (*)    Hgb urine dipstick 1+ (*)    Leukocytes, UA 1+ (*)    Bacteria, UA RARE (*)    Squamous Epithelial / LPF 6-30 (*)    All other components within normal limits  CBC  POC URINE PREG, ED   ____________________________________________  EKG  ED ECG REPORT I, Loney Hering, the attending physician, personally viewed and interpreted this ECG.   Date: 10/14/2014  EKG Time: 2359  Rate: 71  Rhythm: normal sinus rhythm  Axis: normal  Intervals:none  ST&T Change: none   ____________________________________________  RADIOLOGY  CT head: No acute intracranial abnormality noted ____________________________________________   PROCEDURES  Procedure(s) performed: None  Critical Care performed: No  ____________________________________________   INITIAL IMPRESSION /  ASSESSMENT AND PLAN / ED COURSE  Pertinent labs & imaging results that were available during my care of the patient were reviewed by me and considered in my medical decision making (see chart for details).  The patient is a 39 year old female who comes in today with dizziness that seems to worsen whenever she moves her head or changes position. I did order a liter of normal saline IV as well as a dose of meclizine. The patient reports that when she received the meclizine and her symptoms seemed to improve. The patient's CT and her blood work is also unremarkable. She'll be discharged home to follow-up with her primary care physician for vertigo. ____________________________________________   FINAL CLINICAL IMPRESSION(S) / ED DIAGNOSES  Final diagnoses:  Vertigo      Loney Hering, MD 10/14/14 [id number]

## 2014-10-17 ENCOUNTER — Encounter (INDEPENDENT_AMBULATORY_CARE_PROVIDER_SITE_OTHER): Payer: Self-pay

## 2014-10-17 ENCOUNTER — Encounter: Payer: Self-pay | Admitting: Nurse Practitioner

## 2014-10-17 ENCOUNTER — Ambulatory Visit (INDEPENDENT_AMBULATORY_CARE_PROVIDER_SITE_OTHER): Payer: No Typology Code available for payment source | Admitting: Nurse Practitioner

## 2014-10-17 VITALS — BP 130/86 | HR 79 | Temp 97.7°F | Resp 14 | Ht 64.0 in | Wt 207.4 lb

## 2014-10-17 DIAGNOSIS — H8110 Benign paroxysmal vertigo, unspecified ear: Secondary | ICD-10-CM | POA: Insufficient documentation

## 2014-10-17 DIAGNOSIS — H8113 Benign paroxysmal vertigo, bilateral: Secondary | ICD-10-CM

## 2014-10-17 LAB — POCT PREGNANCY, URINE: Preg Test, Ur: NEGATIVE

## 2014-10-17 MED ORDER — FLUTICASONE PROPIONATE 50 MCG/ACT NA SUSP: 2.0000 | Freq: Every day | NASAL | Status: DC

## 2014-10-17 MED ORDER — CYCLOBENZAPRINE HCL 10 MG PO TABS: 10.0000 mg | ORAL_TABLET | Freq: Three times a day (TID) | ORAL | Status: DC | PRN

## 2014-10-17 NOTE — Progress Notes (Signed)
   Subjective:    Patient ID: Nicole Escobar, female    DOB: [birth year], 39 y.o.   MRN: [id number]  HPI  [name] is a 39 yo female here for an ER follow up- vertigo.   1) [facility] ER on 7/23 for dizziness Started feeling dizzy 2 days prior, reported some tunnel vision that improved in a little over a minute, felt anxious and shaky.   EKG- Normal sinus rhythm  CT head- no abnormality noted  Urine pregnancy negative Given Meclizine -Medication helpful  IV saline  Review of Systems  Constitutional: Negative for fever, chills, diaphoresis and fatigue.  Eyes: Negative for visual disturbance.  Respiratory: Negative for chest tightness, shortness of breath and wheezing.   Cardiovascular: Positive for palpitations. Negative for chest pain and leg swelling.       Gets palpitations when feeling anxious  Gastrointestinal: Negative for nausea, vomiting and diarrhea.  Musculoskeletal: Positive for myalgias.  Neurological: Positive for dizziness.  Psychiatric/Behavioral: Negative for suicidal ideas and sleep disturbance. The patient is nervous/anxious.       Objective:   Physical Exam  Constitutional: She is oriented to person, place, and time. She appears well-developed and well-nourished. No distress.  BP 130/86 mmHg  Pulse 79  Temp(Src) 97.7 F (36.5 C)  Resp 14  Ht 5\' 4"  (1.626 m)  Wt 207 lb 6.4 oz (94.076 kg)  BMI 35.58 kg/m2  SpO2 98%  LMP 09/29/2014   HENT:  Head: Normocephalic and atraumatic.  Right Ear: External ear normal.  Left Ear: External ear normal.  TM on left- slightly retracted  TM on right Pearly gray no bulging/retraction  Eyes: EOM are normal. Pupils are equal, round, and reactive to light. Right eye exhibits no discharge. Left eye exhibits no discharge. No scleral icterus.  No nystagmus  Cardiovascular: Normal rate, regular rhythm and normal heart sounds.   Pulmonary/Chest: Effort normal and breath sounds normal. No respiratory distress. She has no wheezes. She  has no rales. She exhibits no tenderness.  Neurological: She is alert and oriented to person, place, and time.  Negative romberg  Skin: Skin is warm and dry. No rash noted. She is not diaphoretic.  Psychiatric: She has a normal mood and affect. Her behavior is normal. Judgment and thought content normal.  Pt anxious      Assessment & Plan:

## 2014-10-17 NOTE — Patient Instructions (Signed)
Dizziness   Dizziness means you feel unsteady or lightheaded. You might feel like you are going to pass out (faint).  HOME CARE   · Drink enough fluids to keep your pee (urine) clear or pale yellow.  · Take your medicines exactly as told by your doctor. If you take blood pressure medicine, always stand up slowly from the lying or sitting position. Hold on to something to steady yourself.  · If you need to stand in one place for a long time, move your legs often. Tighten and relax your leg muscles.  · Have someone stay with you until you feel okay.  · Do not drive or use heavy machinery if you feel dizzy.  · Do not drink alcohol.  GET HELP RIGHT AWAY IF:   · You feel dizzy or lightheaded and it gets worse.  · You feel sick to your stomach (nauseous), or you throw up (vomit).  · You have trouble talking or walking.  · You feel weak or have trouble using your arms, hands, or legs.  · You cannot think clearly or have trouble forming sentences.  · You have chest pain, belly (abdominal) pain, sweating, or you are short of breath.  · Your vision changes.  · You are bleeding.  · You have problems from your medicine that seem to be getting worse.  MAKE SURE YOU:   · Understand these instructions.  · Will watch your condition.  · Will get help right away if you are not doing well or get worse.  Document Released: 02/27/2011 Document Revised: 06/02/2011 Document Reviewed: 02/27/2011  [employer]® Patient Information ©2015 [employer]. This information is not intended to replace advice given to you by your health care provider. Make sure you discuss any questions you have with your health care provider.

## 2014-10-17 NOTE — Assessment & Plan Note (Addendum)
Continue Meclizine, asked pt to stay hydrated- urine pale yellow/clear, and add flonase. Gave handout on dizziness. FU prn worsening/failure to improve.

## 2014-10-17 NOTE — Progress Notes (Signed)
Pre visit review using our clinic review tool, if applicable. No additional management support is needed unless otherwise documented below in the visit note. 

## 2014-11-21 ENCOUNTER — Other Ambulatory Visit: Payer: Self-pay | Admitting: Nurse Practitioner

## 2014-11-21 NOTE — Telephone Encounter (Signed)
Last OV 7.26.16.  Please advise refill. 

## 2014-11-22 ENCOUNTER — Other Ambulatory Visit: Payer: Self-pay | Admitting: *Deleted

## 2014-11-22 ENCOUNTER — Telehealth: Payer: Self-pay | Admitting: *Deleted

## 2014-11-22 MED ORDER — MECLIZINE HCL 25 MG PO TABS: 25.0000 mg | ORAL_TABLET | Freq: Three times a day (TID) | ORAL | Status: DC | PRN

## 2014-11-22 NOTE — Telephone Encounter (Signed)
Pt called requesting a refill on Meclizine, last Rx 7.23.16 by ER MD.  Please advise refill

## 2014-11-22 NOTE — Telephone Encounter (Signed)
rx sent

## 2014-11-22 NOTE — Telephone Encounter (Signed)
Okay to refill. Meclizine 25 mg take PO up to 3 x daily as needed # 30 refills two. Thanks!

## 2015-01-30 ENCOUNTER — Ambulatory Visit: Payer: BLUE CROSS/BLUE SHIELD | Admitting: Nurse Practitioner

## 2015-01-30 ENCOUNTER — Telehealth: Payer: Self-pay | Admitting: Nurse Practitioner

## 2015-01-30 DIAGNOSIS — Z0289 Encounter for other administrative examinations: Secondary | ICD-10-CM

## 2015-01-30 NOTE — Telephone Encounter (Signed)
No charge. She has financial concerns and since she let us know early this morning I am okay with not charging the cancellation fee. Thanks for letting me know!

## 2015-01-30 NOTE — Telephone Encounter (Signed)
FYI, Pt called stating that she is feeling better and does not need to come in today. Pt appt is still on the schedule. I did inform pt of the $50 cancellation fee. Thank You!

## 2015-02-02 ENCOUNTER — Emergency Department: Payer: BLUE CROSS/BLUE SHIELD

## 2015-02-02 ENCOUNTER — Emergency Department
Admission: EM | Admit: 2015-02-02 | Discharge: 2015-02-02 | Disposition: A | Discharge status: Home / Self Care | Payer: BLUE CROSS/BLUE SHIELD | Attending: Emergency Medicine | Admitting: Emergency Medicine

## 2015-02-02 DIAGNOSIS — Z87891 Personal history of nicotine dependence: Secondary | ICD-10-CM | POA: Diagnosis not present

## 2015-02-02 DIAGNOSIS — J40 Bronchitis, not specified as acute or chronic: Secondary | ICD-10-CM

## 2015-02-02 DIAGNOSIS — J209 Acute bronchitis, unspecified: Secondary | ICD-10-CM | POA: Diagnosis not present

## 2015-02-02 DIAGNOSIS — Z79899 Other long term (current) drug therapy: Secondary | ICD-10-CM | POA: Insufficient documentation

## 2015-02-02 DIAGNOSIS — R05 Cough: Secondary | ICD-10-CM | POA: Diagnosis present

## 2015-02-02 IMAGING — CR DG CHEST 2V
2 series · 2 of 2 positions shown · non-contrast
Comparison: [DATE]

CLINICAL DATA: Nine day history of cough

EXAM:
CHEST  2 VIEW

[chest pa]
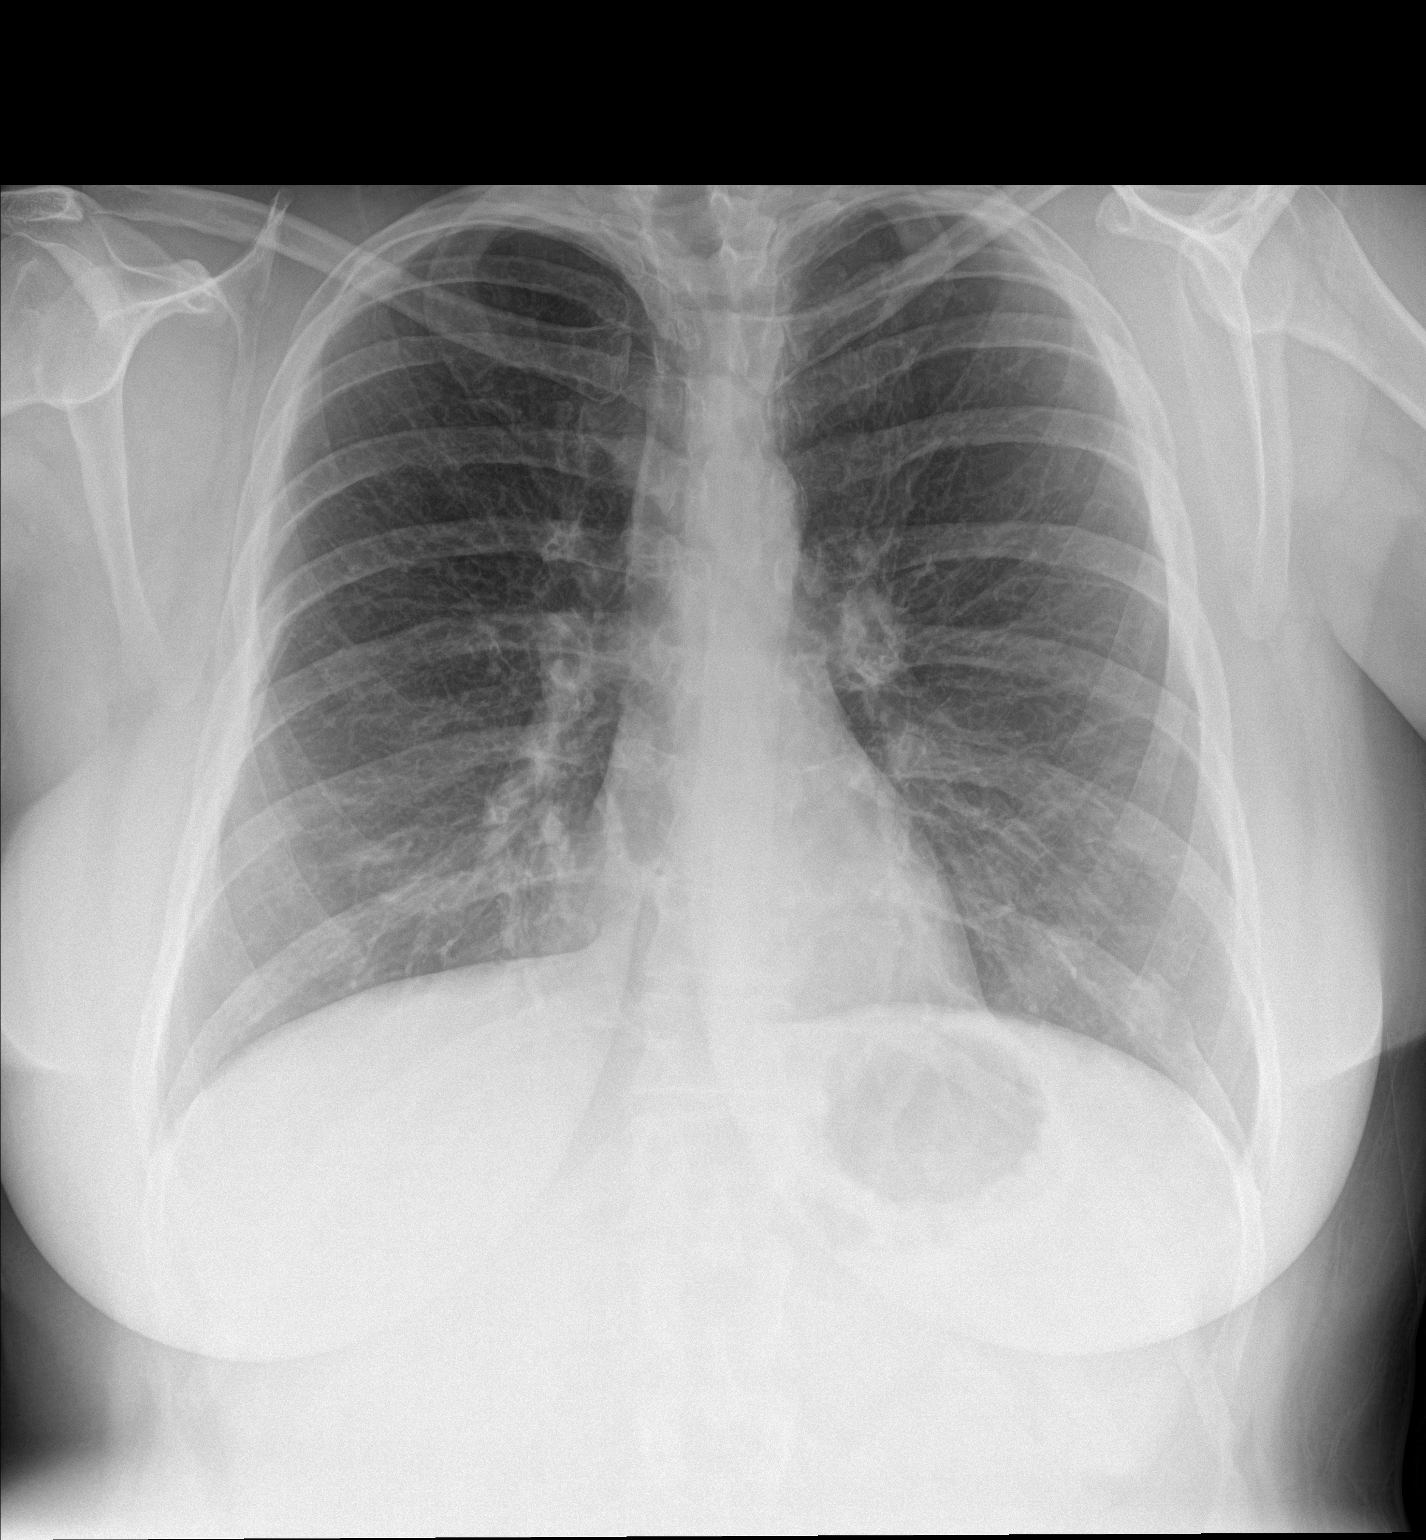

[chest lat]
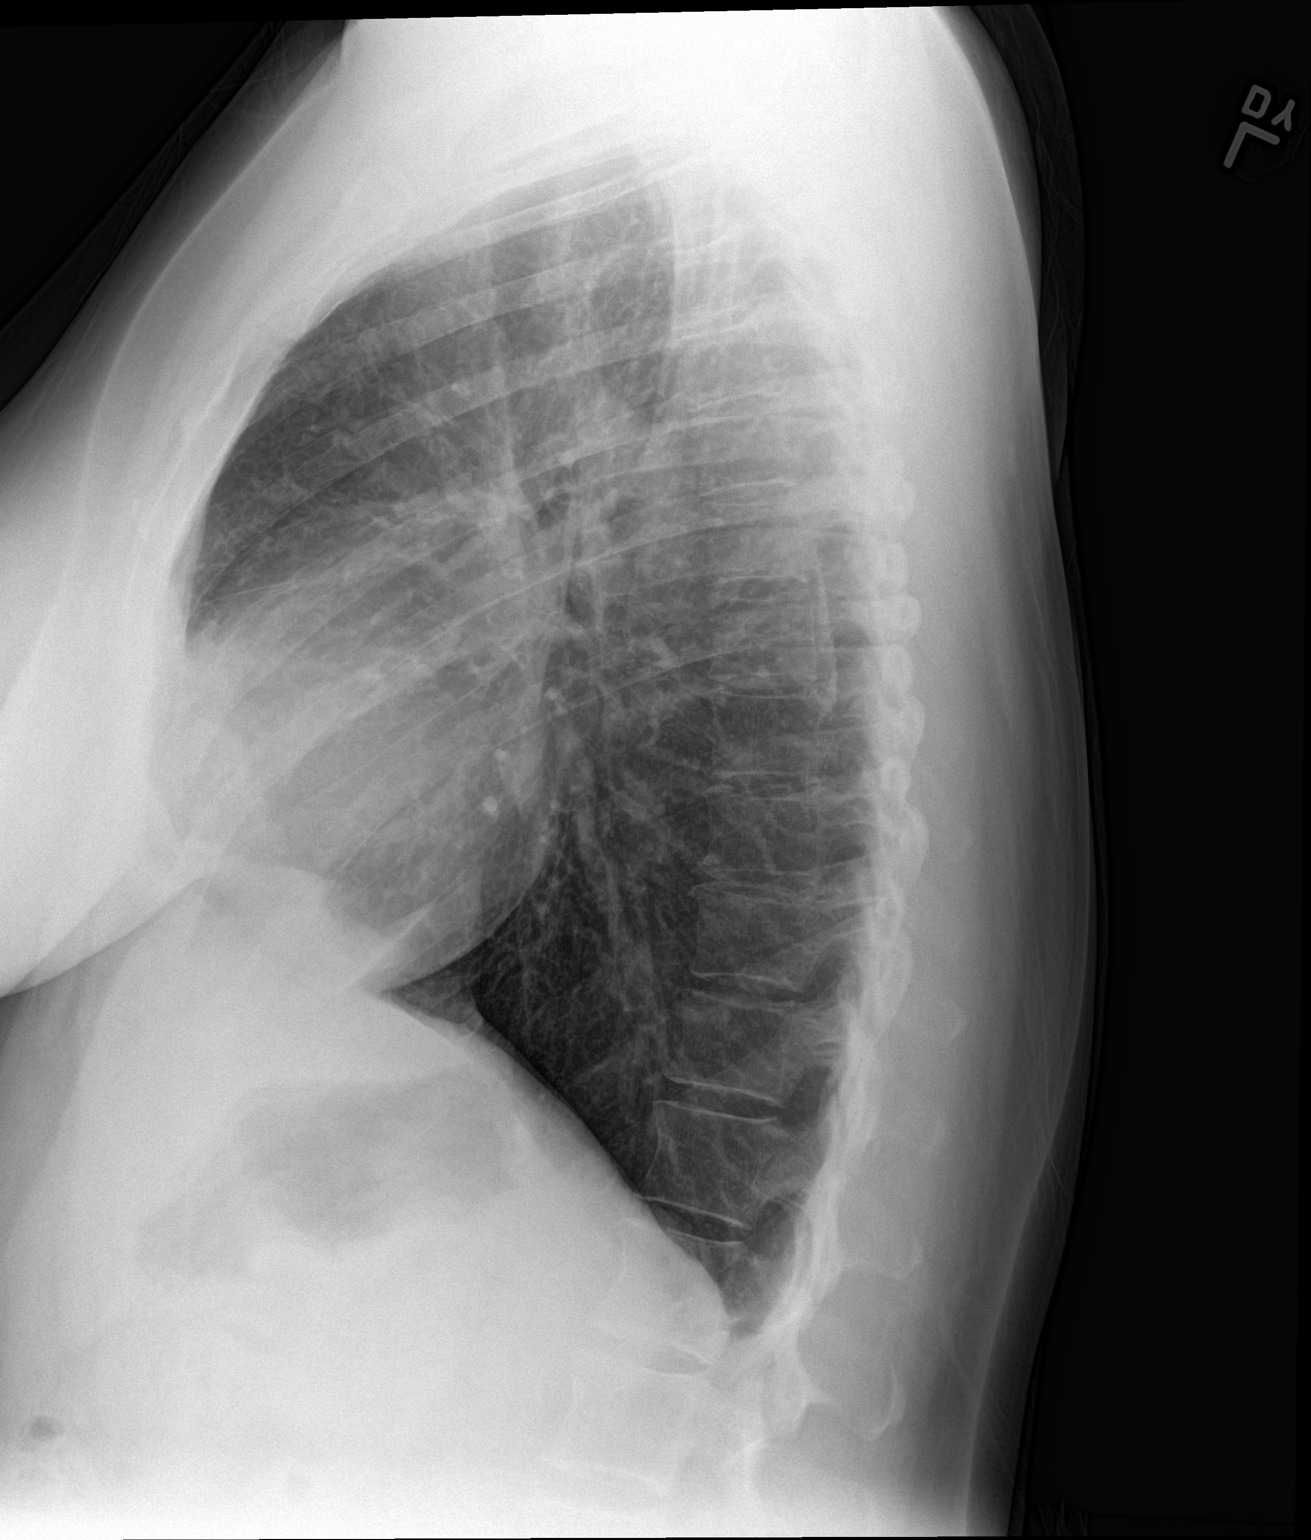

[2 of 2 positions shown; findings below may reference images not displayed]

FINDINGS: There is no edema or consolidation. The heart size and pulmonary
vascularity are normal. No adenopathy. There is evidence of a prior
fracture of the posterior left tenth rib, healed.
IMPRESSION: No edema or consolidation.

## 2015-02-02 MED ORDER — AZITHROMYCIN 250 MG PO TABS: ORAL_TABLET | ORAL | Status: AC

## 2015-02-02 MED ORDER — IPRATROPIUM-ALBUTEROL 0.5-2.5 (3) MG/3ML IN SOLN
3.0000 mL | Freq: Once | RESPIRATORY_TRACT | Status: AC
  Administered 2015-02-02: 3 mL via RESPIRATORY_TRACT
  Filled 2015-02-02: qty 3

## 2015-02-02 MED ORDER — ALBUTEROL SULFATE HFA 108 (90 BASE) MCG/ACT IN AERS: 2.0000 | INHALATION_SPRAY | Freq: Four times a day (QID) | RESPIRATORY_TRACT | Status: DC | PRN

## 2015-02-02 MED ORDER — PREDNISONE 50 MG PO TABS: 50.0000 mg | ORAL_TABLET | Freq: Every day | ORAL | Status: DC

## 2015-02-02 MED ORDER — ACETAMINOPHEN 325 MG PO TABS
650.0000 mg | ORAL_TABLET | Freq: Once | ORAL | Status: AC
  Administered 2015-02-02: 650 mg via ORAL
  Filled 2015-02-02: qty 2

## 2015-02-02 MED ORDER — PREDNISONE 20 MG PO TABS
60.0000 mg | ORAL_TABLET | Freq: Once | ORAL | Status: AC
  Administered 2015-02-02: 60 mg via ORAL
  Filled 2015-02-02: qty 3

## 2015-02-02 NOTE — Discharge Instructions (Signed)

## 2015-02-02 NOTE — ED Notes (Signed)
Pt verbalized understanding of discharge instructions. NAD at this time. 

## 2015-02-02 NOTE — ED Notes (Signed)
Pt in with cough since last week, hx of bronchitis.  Denies fever but has had chills.

## 2015-02-02 NOTE — ED Provider Notes (Signed)
[facility] Emergency Department Provider Note  ____________________________________________  Time seen: On arrival  I have reviewed the triage vital signs and the nursing notes.   HISTORY  Chief Complaint URI    HPI Nicole Escobar is a 39 y.o. female who presents with complaints of a cough. She reports she had a cold last week but felt like she was getting better 3 days ago. She now feels like she has relapsed. She does have a history of smoking but has not smoked in 4 years. She feels like she is wheezing and used her mother's nebulizer machine with little improvement. She denies fevers chills. She does feel weak. She denies shortness of breath or chest pain. She has a productive cough yellow mucus    Past Medical History  Diagnosis Date  . Frequent headaches     unknown onset  . Emphysema of lung     Patient Active Problem List   Diagnosis Date Noted  . Benign paroxysmal positional vertigo 10/17/2014  . Hand joint pain 05/16/2014  . Bilateral carpal tunnel syndrome 05/16/2014  . Stress headaches 03/16/2014    Past Surgical History  Procedure Laterality Date  . Tubal ligation  2013    Current Outpatient Rx  Name  Route  Sig  Dispense  Refill  . albuterol (PROVENTIL HFA;VENTOLIN HFA) 108 (90 BASE) MCG/ACT inhaler   Inhalation   Inhale 2 puffs into the lungs every 6 (six) hours as needed for wheezing or shortness of breath.   1 Inhaler   2   . cyclobenzaprine (FLEXERIL) 10 MG tablet      TAKE ONE TABLET BY MOUTH THREE TIMES DAILY AS NEEDED FOR MUSCLE SPASM   30 tablet   0   . fluticasone (FLONASE) 50 MCG/ACT nasal spray   Each Nare   Place 2 sprays into both nostrils daily.   16 g   6   . meclizine (ANTIVERT) 25 MG tablet   Oral   Take 1 tablet (25 mg total) by mouth 3 (three) times daily as needed for dizziness or nausea.   30 tablet   2     Allergies Sulfa antibiotics  Family History  Problem Relation Age of Onset  .  Alcohol abuse Mother   . Arthritis Mother   . Cancer Mother     ovary  . Hyperlipidemia Mother   . Hypertension Mother   . Diabetes Mother   . Arthritis Maternal Grandmother   . Hypertension Maternal Grandmother   . Cancer Maternal Grandmother     colon    Social History Social History  Substance Use Topics  . Smoking status: Former Smoker    Quit date: 03/25/2003  . Smokeless tobacco: Never Used  . Alcohol Use: No    Review of Systems  Constitutional: Negative for fever. Eyes: Negative for discharge ENT: Negative for sore throat Respiratory: Positive for cough Musculoskeletal: Negative for body aches Skin: Negative for rash. Neurological: Negative for headaches or focal weakness   ____________________________________________   PHYSICAL EXAM:  VITAL SIGNS: ED Triage Vitals  Enc Vitals Group     BP 02/02/15 0650 109/59 mmHg     Pulse Rate 02/02/15 0650 89     Resp 02/02/15 0650 18     Temp 02/02/15 0650 98.2 F (36.8 C)     Temp Source 02/02/15 0650 Oral     SpO2 02/02/15 0650 97 %     Weight 02/02/15 0650 200 lb (90.719 kg)  Height 02/02/15 0650 5\' 4"  (1.626 m)     Head Cir --      Peak Flow --      Pain Score 02/02/15 0650 9     Pain Loc --      Pain Edu? --      Excl. in [place]? --      Constitutional: Alert and oriented. Well appearing and in no distress. Eyes: Conjunctivae are normal.  ENT   Head: Normocephalic and atraumatic.   Mouth/Throat: Mucous membranes are moist. Cardiovascular: Normal rate, regular rhythm.  Respiratory: Normal respiratory effort without tachypnea nor retractions. Mild wheezes bilaterally, no rales Gastrointestinal: Soft and non-tender in all quadrants. No distention. There is no CVA tenderness. Musculoskeletal: Nontender with normal range of motion in all extremities. Neurologic:  Normal speech and language. No gross focal neurologic deficits are appreciated. Skin:  Skin is warm, dry and intact. No rash  noted. Psychiatric: Mood and affect are normal. Patient exhibits appropriate insight and judgment.  ____________________________________________    LABS (pertinent positives/negatives)  Labs Reviewed - No data to display  ____________________________________________     ____________________________________________    RADIOLOGY I have personally reviewed any xrays that were ordered on this patient: Chest x-ray unremarkable  ____________________________________________   PROCEDURES  Procedure(s) performed: none   ____________________________________________   INITIAL IMPRESSION / ASSESSMENT AND PLAN / ED COURSE  Pertinent labs & imaging results that were available during my care of the patient were reviewed by me and considered in my medical decision making (see chart for details).  Patient with cough, mild wheezing after a viral syndrome. Suspect bronchitis we will check x-ray, give DuoNeb and consider steroids.  ____________________________________________   FINAL CLINICAL IMPRESSION(S) / ED DIAGNOSES  Final diagnoses:  Bronchitis     Lavonia Drafts, MD 02/02/15 1316

## 2015-05-14 ENCOUNTER — Ambulatory Visit (INDEPENDENT_AMBULATORY_CARE_PROVIDER_SITE_OTHER): Payer: BLUE CROSS/BLUE SHIELD | Admitting: Family Medicine

## 2015-05-14 ENCOUNTER — Encounter: Payer: Self-pay | Admitting: Family Medicine

## 2015-05-14 DIAGNOSIS — J111 Influenza due to unidentified influenza virus with other respiratory manifestations: Secondary | ICD-10-CM

## 2015-05-14 DIAGNOSIS — R69 Illness, unspecified: Principal | ICD-10-CM

## 2015-05-14 LAB — POCT INFLUENZA A/B
INFLUENZA A, POC: NEGATIVE
Influenza B, POC: NEGATIVE

## 2015-05-14 NOTE — Patient Instructions (Addendum)
Flu test was negative and it is greater than 48 hours out.  Go home and take it easy.  Increase your fluid intake.  Tylenol and/or motrin for fever.  Take care  Dr. Lacinda Axon

## 2015-05-14 NOTE — Progress Notes (Signed)
Pre visit review using our clinic review tool, if applicable. No additional management support is needed unless otherwise documented below in the visit note. 

## 2015-05-14 NOTE — Assessment & Plan Note (Signed)
New problem. With reported exposure to influenza. Rapid flu negative today. Patient afebrile today with a normal exam. Will not treat with Tamiflu given lack of risk factors and the fact that she presented greater than 48 hours from initial symptoms. Advised supportive care and tylenol/motrin for fever/body aches. Aggressive hydration.

## 2015-05-14 NOTE — Progress Notes (Signed)
   Subjective:  Patient ID: Nicole Escobar, female    DOB: 1976/02/08  Age: 40 y.o. MRN: [id number]  CC: Concern for flu  HPI:  40 year old female presents to clinic today with concerns that she may have the flu.  Patient states she's been experiencing fever, bodyaches, chills, runny nose, and sore throat. Patient states that her symptoms are severe.She states that this began on Friday.  She does report recent sick contact as one of her coworkers has had the flu. She's been taking ibuprofen, and NyQuil with no improvement. No known exacerbating factors. No other reported symptoms.   Social Hx   Social History   Social History  . Marital Status: Single    Spouse Name: N/A  . Number of Children: N/A  . Years of Education: N/A   Social History Main Topics  . Smoking status: Former Smoker    Quit date: 03/25/2003  . Smokeless tobacco: Never Used  . Alcohol Use: No  . Drug Use: 0.40 per week    Special: Marijuana  . Sexual Activity:    Partners: Male   Other Topics Concern  . None   Social History Narrative   3rd shift at [employer]- [employer]    76 year old daughter at [employer] [place]   46 year old son   Long-term relationship    GED degree       Review of Systems  Constitutional: Positive for fever and chills.  HENT: Positive for rhinorrhea and sore throat.   Musculoskeletal:       Body aches.    Objective:  BP 126/86 mmHg  Pulse 72  Temp(Src) 98 F (36.7 C) (Oral)  Ht 5\' 4"  (1.626 m)  Wt 207 lb 8 oz (94.121 kg)  BMI 35.60 kg/m2  SpO2 97%  BP/Weight 05/14/2015 02/02/2015 [id number]  Systolic BP [id number]  Diastolic BP 86 59 86  Wt. (Lbs) 207.5 200 207.4  BMI 35.6 34.31 35.58   Physical Exam  Constitutional: She is oriented to person, place, and time. She appears well-developed. No distress.  HENT:  Head: Normocephalic and atraumatic.  Mouth/Throat: Oropharynx is clear and moist.  Poor dentition.  Cardiovascular: Normal rate and regular rhythm.   Pulmonary/Chest:  Effort normal and breath sounds normal.  Neurological: She is alert and oriented to person, place, and time.  Psychiatric:  Anxious.  Vitals reviewed.  Lab Results  Component Value Date   WBC 10.2 10/13/2014   HGB 13.9 10/13/2014   HCT 41.4 10/13/2014   PLT 248 10/13/2014   GLUCOSE 106* 10/13/2014   NA 136 10/13/2014   K 3.8 10/13/2014   CL 103 10/13/2014   CREATININE 0.87 10/13/2014   BUN 12 10/13/2014   CO2 24 10/13/2014    Assessment & Plan:   Problem List Items Addressed This Visit    Influenza-like illness    New problem. With reported exposure to influenza. Rapid flu negative today. Patient afebrile today with a normal exam. Will not treat with Tamiflu given lack of risk factors and the fact that she presented greater than 48 hours from initial symptoms. Advised supportive care and tylenol/motrin for fever/body aches. Aggressive hydration.      Relevant Orders   POCT Influenza A/B (Completed)     Follow-up: PRN  [place]

## 2015-05-16 ENCOUNTER — Telehealth: Payer: Self-pay | Admitting: Nurse Practitioner

## 2015-05-16 NOTE — Telephone Encounter (Signed)
Spoke with patient and advised her to go to Urgent care or [facility] walkin due to having new symptoms. We have no appointments available today in the clinic.

## 2015-05-16 NOTE — Telephone Encounter (Signed)
Pt called about still having symptoms not feeling any better. Pt states she's sweating and chest feels raw and congestion. Pt suggested if she can get an antibiotic? Pharmacy is [facility], [place]. Call pt @ [phone]. Thank you!

## 2015-05-16 NOTE — Telephone Encounter (Signed)
Patient agreed to be seen at Urgent Care or Walk in

## 2015-05-16 NOTE — Telephone Encounter (Signed)
Patient would like to follow up on this request Pt. [phone]

## 2015-05-16 NOTE — Telephone Encounter (Signed)
Spoke with the patient, she states that she has been having fevers, taking ibuprofen for them, has increased cough, congestion, clear drainage today and yellow drainage yesterday.  Saw Dr. Lacinda Axon on Monday, flu was negative and OTC suggestions were given.  Spoke with Dr. Lacinda Axon, advised her to go to urgent care as the fever is new.  Urgent care visit advised.

## 2015-05-17 ENCOUNTER — Encounter: Payer: Self-pay | Admitting: Nurse Practitioner

## 2015-05-17 ENCOUNTER — Ambulatory Visit (INDEPENDENT_AMBULATORY_CARE_PROVIDER_SITE_OTHER): Payer: BLUE CROSS/BLUE SHIELD | Admitting: Nurse Practitioner

## 2015-05-17 VITALS — BP 120/72 | HR 89 | Temp 98.3°F | Ht 64.0 in | Wt 207.0 lb

## 2015-05-17 DIAGNOSIS — J111 Influenza due to unidentified influenza virus with other respiratory manifestations: Secondary | ICD-10-CM | POA: Diagnosis not present

## 2015-05-17 DIAGNOSIS — R69 Illness, unspecified: Principal | ICD-10-CM

## 2015-05-17 MED ORDER — ALBUTEROL SULFATE HFA 108 (90 BASE) MCG/ACT IN AERS: 2.0000 | INHALATION_SPRAY | Freq: Four times a day (QID) | RESPIRATORY_TRACT | Status: DC | PRN

## 2015-05-17 MED ORDER — AMOXICILLIN-POT CLAVULANATE 875-125 MG PO TABS: 1.0000 | ORAL_TABLET | Freq: Two times a day (BID) | ORAL | Status: DC

## 2015-05-17 NOTE — Progress Notes (Signed)
Pre visit review using our clinic review tool, if applicable. No additional management support is needed unless otherwise documented below in the visit note. 

## 2015-05-17 NOTE — Patient Instructions (Signed)
Augmentin as prescribed.  Please take a probiotic ( Align, Floraque or Culturelle) Generic is okay while you are on the antibiotic to prevent a serious antibiotic associated diarrhea  Called clostirudium dificile colitis and a vaginal yeast infection.   NyQuil at night.

## 2015-05-17 NOTE — Progress Notes (Signed)
Patient ID: Nicole Escobar, female    DOB: 1976/02/03  Age: 40 y.o. MRN: [id number]  CC: Cough; Nasal Congestion; Sore Throat; and Headache   HPI Nicole Escobar presents for CC of cough, congestion, ST, and HA x 7 days.   1) Chest congestion  HA-mild and not intractable Hot/cold feelings, but no fever Frontal pressure  Rhinorrhea-clear to yellow ST-unresolved  Treatment to date:  NyQuil Ibuprofen   Sick contacts- co-workers  Patient was seen on 05/14/2015 by Dr. Lacinda Axon for influenza-like illness She had a rapid flu that was negative and was afebrile and was told to complete supportive care and follow-up as needed.   History Nicole Escobar has a past medical history of Frequent headaches and Emphysema of lung ([place]).   She has past surgical history that includes Tubal ligation (2013).   Her family history includes Alcohol abuse in her mother; Arthritis in her maternal grandmother and mother; Cancer in her maternal grandmother and mother; Diabetes in her mother; Hyperlipidemia in her mother; Hypertension in her maternal grandmother and mother.She reports that she quit smoking about 12 years ago. She has never used smokeless tobacco. She reports that she uses illicit drugs (Marijuana). She reports that she does not drink alcohol.  No outpatient prescriptions prior to visit.   No facility-administered medications prior to visit.    ROS Review of Systems  Constitutional: Positive for chills, diaphoresis and fatigue. Negative for fever.  HENT: Positive for congestion, postnasal drip, rhinorrhea, sinus pressure and sore throat. Negative for ear pain, sneezing, trouble swallowing and voice change.   Respiratory: Positive for cough. Negative for chest tightness, shortness of breath and wheezing.   Cardiovascular: Negative for chest pain, palpitations and leg swelling.  Gastrointestinal: Negative for nausea, vomiting and diarrhea.  Skin: Negative for rash.  Neurological: Positive for headaches.  Negative for dizziness.    Objective:  BP 120/72 mmHg  Pulse 89  Temp(Src) 98.3 F (36.8 C) (Oral)  Ht 5\' 4"  (1.626 m)  Wt 207 lb (93.895 kg)  BMI 35.51 kg/m2  SpO2 97%  LMP 05/14/2015  Physical Exam  Constitutional: She is oriented to person, place, and time. She appears well-developed and well-nourished. No distress.  HENT:  Head: Normocephalic and atraumatic.  Right Ear: External ear normal.  Left Ear: External ear normal.  Mouth/Throat: Oropharynx is clear and moist. No oropharyngeal exudate.  Poor dentition TMs clear bilaterally no retraction or bulging  Eyes: EOM are normal. Pupils are equal, round, and reactive to light. Right eye exhibits no discharge. Left eye exhibits no discharge. No scleral icterus.  Neck: Normal range of motion. Neck supple.  Cardiovascular: Normal rate, regular rhythm and normal heart sounds.  Exam reveals no gallop and no friction rub.   No murmur heard. Pulmonary/Chest: Effort normal and breath sounds normal. No respiratory distress. She has no wheezes. She has no rales. She exhibits no tenderness.  Lymphadenopathy:    She has no cervical adenopathy.  Neurological: She is alert and oriented to person, place, and time. No cranial nerve deficit. She exhibits normal muscle tone. Coordination normal.  Skin: Skin is warm and dry. No rash noted. She is not diaphoretic.  Psychiatric: She has a normal mood and affect. Her behavior is normal. Judgment and thought content normal.      Assessment & Plan:   Tonia was seen today for cough, nasal congestion, sore throat and headache.  Diagnoses and all orders for this visit:  Influenza-like illness  Other orders -  amoxicillin-clavulanate (AUGMENTIN) 875-125 MG tablet; Take 1 tablet by mouth 2 (two) times daily. -     albuterol (PROVENTIL HFA;VENTOLIN HFA) 108 (90 Base) MCG/ACT inhaler; Inhale 2 puffs into the lungs every 6 (six) hours as needed for wheezing or shortness of breath.   I am  having Ms. Volpe start on amoxicillin-clavulanate and albuterol.  Meds ordered this encounter  Medications  . amoxicillin-clavulanate (AUGMENTIN) 875-125 MG tablet    Sig: Take 1 tablet by mouth 2 (two) times daily.    Dispense:  14 tablet    Refill:  0    Order Specific Question:  Supervising Provider    Answer:  Deborra Medina L [[id number]]  . albuterol (PROVENTIL HFA;VENTOLIN HFA) 108 (90 Base) MCG/ACT inhaler    Sig: Inhale 2 puffs into the lungs every 6 (six) hours as needed for wheezing or shortness of breath.    Dispense:  1 Inhaler    Refill:  0    Product selection for cheapest is permitted    Order Specific Question:  Supervising Provider    Answer:  Crecencio Mc [[id number]]     Follow-up: Return if symptoms worsen or fail to improve.

## 2015-05-25 NOTE — Assessment & Plan Note (Signed)
Established problem worsening Augmentin and albuterol inhaler sent to pharmacy Advised her to take a probiotic Follow-up if worsening or failure to improve

## 2015-05-28 ENCOUNTER — Ambulatory Visit (INDEPENDENT_AMBULATORY_CARE_PROVIDER_SITE_OTHER): Payer: BLUE CROSS/BLUE SHIELD | Admitting: Nurse Practitioner

## 2015-05-28 ENCOUNTER — Encounter: Payer: Self-pay | Admitting: Nurse Practitioner

## 2015-05-28 VITALS — BP 114/72 | HR 70 | Temp 97.8°F | Resp 16 | Ht 64.0 in | Wt 211.6 lb

## 2015-05-28 DIAGNOSIS — J209 Acute bronchitis, unspecified: Secondary | ICD-10-CM | POA: Diagnosis not present

## 2015-05-28 MED ORDER — LEVOFLOXACIN 500 MG PO TABS: 500.0000 mg | ORAL_TABLET | Freq: Every day | ORAL | Status: DC

## 2015-05-28 MED ORDER — PREDNISONE 10 MG PO TABS: ORAL_TABLET | ORAL | Status: DC

## 2015-05-28 NOTE — Patient Instructions (Signed)
Prednisone with breakfast or lunch at the latest.  6 tablets on day 1, 5 tablets on day 2, 4 tablets on day 3, 3 tablets on day 4, 2 tablets day 5, 1 tablet on day 6...done! Take tablets all together not spaced out Don't take with NSAIDs (Ibuprofen, Aleve, Naproxen, Meloxicam ect...)  Please take a probiotic ( Align, Floraque or Culturelle) while you are on the antibiotic to prevent a serious antibiotic associated diarrhea  Called clostirudium dificile colitis and a vaginal yeast infection.

## 2015-05-28 NOTE — Progress Notes (Signed)
Patient ID: Nicole Escobar, female    DOB: 08/27/1975  Age: 40 y.o. MRN: [id number]  CC: chest congestion and Back Pain   HPI Nicole Escobar presents for follow up of 3 weeks of cough without improvement.   1) Pt was seen in ED on 11/11/6 for bronchitis  Then seen in Feb 20th, 2017 by Dr. Lacinda Axon for influenza-like illness and was told to do supportive therapy.   05/17/15 she saw me again and was given Augmentin and OTC measures.   Today: Still coughing, having pain at base of lungs posteriorly with coughing.  Not coughing up anything  No more treatments OTC and finished Augmentin  History Nicole Escobar has a past medical history of Frequent headaches and Emphysema of lung ([place]).   She has past surgical history that includes Tubal ligation (2013).   Her family history includes Alcohol abuse in her mother; Arthritis in her maternal grandmother and mother; Cancer in her maternal grandmother and mother; Diabetes in her mother; Hyperlipidemia in her mother; Hypertension in her maternal grandmother and mother.She reports that she quit smoking about 12 years ago. She has never used smokeless tobacco. She reports that she uses illicit drugs (Marijuana). She reports that she does not drink alcohol.  Outpatient Prescriptions Prior to Visit  Medication Sig Dispense Refill  . albuterol (PROVENTIL HFA;VENTOLIN HFA) 108 (90 Base) MCG/ACT inhaler Inhale 2 puffs into the lungs every 6 (six) hours as needed for wheezing or shortness of breath. 1 Inhaler 0  . amoxicillin-clavulanate (AUGMENTIN) 875-125 MG tablet Take 1 tablet by mouth 2 (two) times daily. (Patient not taking: Reported on 05/28/2015) 14 tablet 0   No facility-administered medications prior to visit.    ROS Review of Systems  Constitutional: Positive for fatigue. Negative for fever, chills and diaphoresis.  HENT: Positive for congestion.   Respiratory: Positive for cough. Negative for chest tightness, shortness of breath and wheezing.    Cardiovascular: Negative for chest pain, palpitations and leg swelling.  Gastrointestinal: Negative for nausea, vomiting and diarrhea.  Musculoskeletal: Positive for myalgias.  Skin: Negative for rash.  Neurological: Negative for dizziness and headaches.    Objective:  BP 114/72 mmHg  Pulse 70  Temp(Src) 97.8 F (36.6 C) (Oral)  Resp 16  Ht 5\' 4"  (1.626 m)  Wt 211 lb 9.6 oz (95.981 kg)  BMI 36.30 kg/m2  SpO2 96%  LMP 05/14/2015  Physical Exam  Constitutional: She is oriented to person, place, and time. She appears well-developed and well-nourished. No distress.  HENT:  Head: Normocephalic and atraumatic.  Right Ear: External ear normal.  Left Ear: External ear normal.  Mouth/Throat: No oropharyngeal exudate.  Tms clear bilaterally  Eyes: EOM are normal. Pupils are equal, round, and reactive to light. Right eye exhibits no discharge. Left eye exhibits no discharge. No scleral icterus.  Neck: Normal range of motion. Neck supple.  Cardiovascular: Normal rate, regular rhythm and normal heart sounds.  Exam reveals no gallop and no friction rub.   No murmur heard. Pulmonary/Chest: Effort normal. No respiratory distress. She has wheezes. She has no rales. She exhibits no tenderness.  Wheezes and occasional rhonchi  Musculoskeletal: Normal range of motion. She exhibits tenderness. She exhibits no edema.  Posterior thoracic paraspinal muscle tenderness esp on left side to palpation  Lymphadenopathy:    She has no cervical adenopathy.  Neurological: She is alert and oriented to person, place, and time. No cranial nerve deficit. She exhibits normal muscle tone. Coordination normal.  Skin: Skin is warm  and dry. No rash noted. She is not diaphoretic.  Psychiatric: She has a normal mood and affect. Her behavior is normal. Judgment and thought content normal.      Assessment & Plan:   Nicole Escobar was seen today for chest congestion and back pain.  Diagnoses and all orders for this  visit:  Acute bronchitis, unspecified organism  Other orders -     levofloxacin (LEVAQUIN) 500 MG tablet; Take 1 tablet (500 mg total) by mouth daily. -     predniSONE (DELTASONE) 10 MG tablet; Take 6 tablets by mouth on day 1 then decrease by 1 tablet each day until gone.  I have discontinued [name]'s amoxicillin-clavulanate. I am also having her start on levofloxacin and predniSONE. Additionally, I am having her maintain her albuterol.  Meds ordered this encounter  Medications  . levofloxacin (LEVAQUIN) 500 MG tablet    Sig: Take 1 tablet (500 mg total) by mouth daily.    Dispense:  7 tablet    Refill:  0    Order Specific Question:  Supervising Provider    Answer:  Deborra Medina L [[id number]]  . predniSONE (DELTASONE) 10 MG tablet    Sig: Take 6 tablets by mouth on day 1 then decrease by 1 tablet each day until gone.    Dispense:  21 tablet    Refill:  0    Order Specific Question:  Supervising Provider    Answer:  Crecencio Mc [[id number]]     Follow-up: Return if symptoms worsen or fail to improve.

## 2015-06-05 DIAGNOSIS — J209 Acute bronchitis, unspecified: Secondary | ICD-10-CM | POA: Insufficient documentation

## 2015-06-05 DIAGNOSIS — J40 Bronchitis, not specified as acute or chronic: Secondary | ICD-10-CM | POA: Insufficient documentation

## 2015-06-05 NOTE — Assessment & Plan Note (Signed)
Treating for bronchitis with inhaler and prednisone. If no improvement in 48 hrs with prednisone can take Levaquin. FU prn worsening/failure to improve.

## 2015-06-12 ENCOUNTER — Encounter: Payer: Self-pay | Admitting: Emergency Medicine

## 2015-06-12 ENCOUNTER — Emergency Department
Admission: EM | Admit: 2015-06-12 | Discharge: 2015-06-12 | Disposition: A | Discharge status: Home / Self Care | Payer: BLUE CROSS/BLUE SHIELD | Attending: Emergency Medicine | Admitting: Emergency Medicine

## 2015-06-12 ENCOUNTER — Ambulatory Visit: Payer: BLUE CROSS/BLUE SHIELD | Admitting: Internal Medicine

## 2015-06-12 DIAGNOSIS — J029 Acute pharyngitis, unspecified: Secondary | ICD-10-CM | POA: Insufficient documentation

## 2015-06-12 DIAGNOSIS — H8110 Benign paroxysmal vertigo, unspecified ear: Secondary | ICD-10-CM | POA: Diagnosis not present

## 2015-06-12 DIAGNOSIS — Z87891 Personal history of nicotine dependence: Secondary | ICD-10-CM | POA: Diagnosis not present

## 2015-06-12 LAB — POCT RAPID STREP A: Streptococcus, Group A Screen (Direct): NEGATIVE

## 2015-06-12 MED ORDER — AMOXICILLIN 500 MG PO TABS: 500.0000 mg | ORAL_TABLET | Freq: Two times a day (BID) | ORAL | Status: DC

## 2015-06-12 MED ORDER — LIDOCAINE VISCOUS 2 % MT SOLN: 20.0000 mL | OROMUCOSAL | Status: DC | PRN

## 2015-06-12 NOTE — ED Notes (Signed)
Pt to ed with c/o sore throat today, denies fever.

## 2015-06-12 NOTE — ED Notes (Signed)
Sore throat since last pm  Noticed some swelling to back of throat this am  Area red and swollen

## 2015-06-12 NOTE — ED Provider Notes (Signed)
[facility] Emergency Department Provider Note  ____________________________________________  Time seen: Approximately 9:45 AM  I have reviewed the triage vital signs and the nursing notes.   HISTORY  Chief Complaint Sore Throat    HPI Nicole Escobar is a 40 y.o. female presents for sudden onset of sore throat since last night. Patient noticed that her uvula was red and swollen. Denies any alcohol recent intake or change in medications or foods. The patient denies any fever has not taken any medication over-the-counter. Symptoms worse with swallowing   Past Medical History  Diagnosis Date  . Frequent headaches     unknown onset  . Emphysema of lung [facility])     Patient Active Problem List   Diagnosis Date Noted  . Acute bronchitis 06/05/2015  . Influenza-like illness 05/14/2015  . Benign paroxysmal positional vertigo 10/17/2014  . Hand joint pain 05/16/2014  . Bilateral carpal tunnel syndrome 05/16/2014  . Stress headaches 03/16/2014    Past Surgical History  Procedure Laterality Date  . Tubal ligation  2013    Current Outpatient Rx  Name  Route  Sig  Dispense  Refill  . amoxicillin (AMOXIL) 500 MG tablet   Oral   Take 1 tablet (500 mg total) by mouth 2 (two) times daily.   20 tablet   0   . lidocaine (XYLOCAINE) 2 % solution   Mouth/Throat   Use as directed 20 mLs in the mouth or throat as needed for mouth pain.   100 mL   0     Allergies Sulfa antibiotics  Family History  Problem Relation Age of Onset  . Alcohol abuse Mother   . Arthritis Mother   . Cancer Mother     ovary  . Hyperlipidemia Mother   . Hypertension Mother   . Diabetes Mother   . Arthritis Maternal Grandmother   . Hypertension Maternal Grandmother   . Cancer Maternal Grandmother     colon    Social History Social History  Substance Use Topics  . Smoking status: Former Smoker    Quit date: 03/25/2003  . Smokeless tobacco: Never Used  . Alcohol Use: No     Review of Systems Constitutional: No fever/chills Eyes: No visual changes. ENT: Positive sore throat Cardiovascular: Denies chest pain. Respiratory: Denies shortness of breath. Musculoskeletal: Negative for back pain. Skin: Negative for rash. Neurological: Negative for headaches, focal weakness or numbness.  10-point ROS otherwise negative.  ____________________________________________   PHYSICAL EXAM: BP 129/79 mmHg  Pulse 71  Temp(Src) 97.6 F (36.4 C) (Oral)  Resp 18  Ht 5\' 4"  (1.626 m)  Wt 95.709 kg  BMI 36.20 kg/m2  SpO2 97%  LMP 06/12/2015  VITAL SIGNS: ED Triage Vitals  Enc Vitals Group     BP --      Pulse --      Resp --      Temp --      Temp src --      SpO2 --      Weight --      Height --      Head Cir --      Peak Flow --      Pain Score 06/12/15 0857 5     Pain Loc --      Pain Edu? --      Excl. in [place]? --     Constitutional: Alert and oriented. Well appearing and in no acute distress. Head: Atraumatic. Nose: No congestion/rhinnorhea. Mouth/Throat: Mucous membranes  are moist.  Oropharynx erythematous with mild uvular edema. Neck: No stridor. No cervical adenopathy   Cardiovascular: Normal rate, regular rhythm. Grossly normal heart sounds.  Good peripheral circulation. Respiratory: Normal respiratory effort.  No retractions. Lungs CTAB. Neurologic:  Normal speech and language. No gross focal neurologic deficits are appreciated. No gait instability. Skin:  Skin is warm, dry and intact. No rash noted. Psychiatric: Mood and affect are normal. Speech and behavior are normal.  ____________________________________________   LABS (all labs ordered are listed, but only abnormal results are displayed)  Labs Reviewed  CULTURE, GROUP A STREP [facility])  POCT RAPID STREP A   ____________________________________________    PROCEDURES  Procedure(s) performed: None  Critical Care performed:  No  ____________________________________________   INITIAL IMPRESSION / ASSESSMENT AND PLAN / ED COURSE  Pertinent labs & imaging results that were available during my care of the patient were reviewed by me and considered in my medical decision making (see chart for details).  Acute pharyngitis with rapid strep negative. Rx given for amoxicillin 500 mg 3 times a day viscous lidocaine gargle and sore spit. Patient follow-up with PCP or return to the ER with any worsening symptomology. ____________________________________________   FINAL CLINICAL IMPRESSION(S) / ED DIAGNOSES  Final diagnoses:  Acute pharyngitis, unspecified pharyngitis type     This chart was dictated using voice recognition software/Dragon. Despite best efforts to proofread, errors can occur which can change the meaning. Any change was purely unintentional.   Arlyss Repress, PA-C 06/12/15 [place], MD 06/12/15 [phone]

## 2015-06-12 NOTE — Discharge Instructions (Signed)

## 2015-06-14 LAB — CULTURE, GROUP A STREP (THRC)

## 2016-03-10 ENCOUNTER — Emergency Department
Admission: EM | Admit: 2016-03-10 | Discharge: 2016-03-10 | Disposition: A | Discharge status: Home / Self Care | Payer: BLUE CROSS/BLUE SHIELD | Attending: Emergency Medicine | Admitting: Emergency Medicine

## 2016-03-10 ENCOUNTER — Encounter: Payer: Self-pay | Admitting: Emergency Medicine

## 2016-03-10 DIAGNOSIS — Z87891 Personal history of nicotine dependence: Secondary | ICD-10-CM | POA: Diagnosis not present

## 2016-03-10 DIAGNOSIS — K122 Cellulitis and abscess of mouth: Secondary | ICD-10-CM | POA: Diagnosis not present

## 2016-03-10 DIAGNOSIS — J029 Acute pharyngitis, unspecified: Secondary | ICD-10-CM | POA: Diagnosis present

## 2016-03-10 LAB — POCT RAPID STREP A: Streptococcus, Group A Screen (Direct): NEGATIVE

## 2016-03-10 MED ORDER — CYPROHEPTADINE HCL 4 MG PO TABS: 4.0000 mg | ORAL_TABLET | Freq: Three times a day (TID) | ORAL | 0 refills | Status: DC | PRN

## 2016-03-10 MED ORDER — DEXAMETHASONE SODIUM PHOSPHATE 10 MG/ML IJ SOLN
20.0000 mg | Freq: Once | INTRAMUSCULAR | Status: AC
  Administered 2016-03-10: 20 mg via INTRAVENOUS
  Filled 2016-03-10: qty 2

## 2016-03-10 MED ORDER — AMOXICILLIN 500 MG PO CAPS: 500.0000 mg | ORAL_CAPSULE | Freq: Three times a day (TID) | ORAL | 0 refills | Status: DC

## 2016-03-10 MED ORDER — FAMOTIDINE IN NACL 20-0.9 MG/50ML-% IV SOLN
20.0000 mg | Freq: Once | INTRAVENOUS | Status: AC
  Administered 2016-03-10: 20 mg via INTRAVENOUS
  Filled 2016-03-10: qty 50

## 2016-03-10 MED ORDER — PREDNISONE 10 MG PO TABS: 10.0000 mg | ORAL_TABLET | Freq: Two times a day (BID) | ORAL | 0 refills | Status: DC

## 2016-03-10 MED ORDER — RANITIDINE HCL 150 MG PO TABS: 150.0000 mg | ORAL_TABLET | Freq: Two times a day (BID) | ORAL | 0 refills | Status: DC

## 2016-03-10 NOTE — ED Triage Notes (Signed)
Sore throat x 2 days

## 2016-03-10 NOTE — ED Notes (Signed)
Iv placed in right bicep

## 2016-03-10 NOTE — ED Notes (Signed)
See triage note  Sore throat for couple of days   Increased pain this am  Hard to swallow  Throat red and uvula is swollen

## 2016-03-10 NOTE — Discharge Instructions (Signed)
Take the prescription meds as directed. Follow-up with Dr. Flonnie Overman as needed.

## 2016-03-12 LAB — CULTURE, GROUP A STREP (THRC)

## 2016-03-12 NOTE — ED Provider Notes (Signed)
[facility] Emergency Department Provider Note ____________________________________________  Time seen: 0830  I have reviewed the triage vital signs and the nursing notes.  HISTORY  Chief Complaint  Sore Throat  HPI Nicole Escobar is a 40 y.o. female presents to the ED for 2 day complaint of sore throat. Patient describes throat "feels swollen." She also describes some postnasal drainage, runny nose, and sneezing in the interim. She has not taken any medication for symptom relief. She denies any sick contacts, or other exposures.Denies any difficulty swallowing, breathing, or controlling secretions. She has had no previous episodes of anaphylaxis or angioedema.  Past Medical History:  Diagnosis Date  . Emphysema of lung ([place])   . Frequent headaches    unknown onset    Patient Active Problem List   Diagnosis Date Noted  . Acute bronchitis 06/05/2015  . Influenza-like illness 05/14/2015  . Benign paroxysmal positional vertigo 10/17/2014  . Hand joint pain 05/16/2014  . Bilateral carpal tunnel syndrome 05/16/2014  . Stress headaches 03/16/2014    Past Surgical History:  Procedure Laterality Date  . TUBAL LIGATION  2013    Prior to Admission medications   Medication Sig Start Date End Date Taking? Authorizing Provider  amoxicillin (AMOXIL) 500 MG capsule Take 1 capsule (500 mg total) by mouth 3 (three) times daily. 03/10/16   Quinzell Malcomb V Bacon Camren Henthorn, PA-C  cyproheptadine (PERIACTIN) 4 MG tablet Take 1 tablet (4 mg total) by mouth 3 (three) times daily as needed for allergies. 03/10/16   Loren Sawaya V Bacon Jesica Goheen, PA-C  lidocaine (XYLOCAINE) 2 % solution Use as directed 20 mLs in the mouth or throat as needed for mouth pain. 06/12/15   Arlyss Repress, PA-C  predniSONE (DELTASONE) 10 MG tablet Take 1 tablet (10 mg total) by mouth 2 (two) times daily with a meal. 03/10/16   Yanessa Hocevar V Bacon Dodie Parisi, PA-C  ranitidine (ZANTAC) 150 MG tablet Take 1 tablet (150 mg  total) by mouth 2 (two) times daily. 03/10/16   Ihan Pat V Bacon Tijana Walder, PA-C   Allergies Sulfa antibiotics  Family History  Problem Relation Age of Onset  . Alcohol abuse Mother   . Arthritis Mother   . Cancer Mother     ovary  . Hyperlipidemia Mother   . Hypertension Mother   . Diabetes Mother   . Arthritis Maternal Grandmother   . Hypertension Maternal Grandmother   . Cancer Maternal Grandmother     colon    Social History Social History  Substance Use Topics  . Smoking status: Former Smoker    Quit date: 03/25/2003  . Smokeless tobacco: Never Used  . Alcohol use No    Review of Systems  Constitutional: Negative for fever. Eyes: Negative for visual changes. ENT: Positive for sore throat. Cardiovascular: Negative for chest pain. Respiratory: Negative for shortness of breath. Gastrointestinal: Negative for abdominal pain, vomiting and diarrhea. Skin: Negative for rash. ____________________________________________  PHYSICAL EXAM:  VITAL SIGNS: ED Triage Vitals [03/10/16 0704]  Enc Vitals Group     BP (!) 152/100     Pulse Rate 80     Resp 18     Temp 97.7 F (36.5 C)     Temp Source Oral     SpO2 98 %     Weight 210 lb (95.3 kg)     Height 5\' 4"  (1.626 m)     Head Circumference      Peak Flow      Pain Score 10  Pain Loc      Pain Edu?      Excl. in [place]?     Constitutional: Alert and oriented. Well appearing and in no distress. Head: Normocephalic and atraumatic. Eyes: Conjunctivae are normal. PERRL. Normal extraocular movements Ears: Canals clear. TMs intact bilaterally. Nose: No congestion/rhinorrhea/epistaxis. Mouth/Throat: Mucous membranes are moist. Patient's uvula is midline however does appear slightly edematous and translucent on gross inspection. Patient's tonsils are absent in no oropharyngeal erythema is otherwise appreciated at the tonsillar pillars. Patient with generally poor dentition, noting multiple cavities and tooth decay. Neck:  Supple. No thyromegaly. Hematological/Lymphatic/Immunological: No cervical lymphadenopathy. Cardiovascular: Normal rate, regular rhythm. Normal distal pulses. Respiratory: Normal respiratory effort. No wheezes/rales/rhonchi. Skin:  Skin is warm, dry and intact. No rash noted. ____________________________________________   LABS (pertinent positives/negatives) Labs Reviewed  CULTURE, GROUP A STREP [facility])  POCT RAPID STREP A  ____________________________________________  PROCEDURES  Decadron 20 mg IV Famotidine 20 mg  IVPB ____________________________________________  INITIAL IMPRESSION / ASSESSMENT AND PLAN / ED COURSE  Patient with a clinical presentation consistent with an acute uvulitis. She reports improvement of her symptoms following IV administration of steroids and H2 blockers. The patient will be discharged with prescriptions for ranitidine, Periactin, prednisone, and amoxicillin. She is further referred to ENT for evaluation and admission symptoms not improve as expected. She should return to the ED immediately as discussed for any difficulty with airway control.  Clinical Course    ____________________________________________  FINAL CLINICAL IMPRESSION(S) / ED DIAGNOSES  Final diagnoses:  Uvulitis      Melvenia Needles, PA-C 03/12/16 1828    Drenda Freeze, MD 03/13/16 1422

## 2016-04-03 ENCOUNTER — Telehealth: Payer: Self-pay | Admitting: Family

## 2016-04-03 ENCOUNTER — Encounter: Payer: Self-pay | Admitting: Family

## 2016-04-03 ENCOUNTER — Other Ambulatory Visit: Payer: Self-pay | Admitting: Family

## 2016-04-03 ENCOUNTER — Ambulatory Visit (INDEPENDENT_AMBULATORY_CARE_PROVIDER_SITE_OTHER): Payer: BLUE CROSS/BLUE SHIELD | Admitting: Family

## 2016-04-03 ENCOUNTER — Ambulatory Visit
Admission: RE | Admit: 2016-04-03 | Discharge: 2016-04-03 | Disposition: A | Discharge status: Home / Self Care | Payer: BLUE CROSS/BLUE SHIELD | Source: Ambulatory Visit | Attending: Family | Admitting: Family

## 2016-04-03 ENCOUNTER — Ambulatory Visit (INDEPENDENT_AMBULATORY_CARE_PROVIDER_SITE_OTHER): Payer: BLUE CROSS/BLUE SHIELD

## 2016-04-03 VITALS — BP 148/88 | HR 73 | Temp 98.1°F | Ht 64.0 in | Wt 209.6 lb

## 2016-04-03 DIAGNOSIS — R1030 Lower abdominal pain, unspecified: Secondary | ICD-10-CM | POA: Diagnosis not present

## 2016-04-03 DIAGNOSIS — M25552 Pain in left hip: Secondary | ICD-10-CM | POA: Diagnosis not present

## 2016-04-03 DIAGNOSIS — Z87898 Personal history of other specified conditions: Secondary | ICD-10-CM | POA: Diagnosis not present

## 2016-04-03 DIAGNOSIS — F1911 Other psychoactive substance abuse, in remission: Secondary | ICD-10-CM

## 2016-04-03 DIAGNOSIS — F1411 Cocaine abuse, in remission: Secondary | ICD-10-CM

## 2016-04-03 DIAGNOSIS — R1032 Left lower quadrant pain: Secondary | ICD-10-CM | POA: Diagnosis not present

## 2016-04-03 LAB — COMPREHENSIVE METABOLIC PANEL
ALT: 14 U/L (ref 0–35)
AST: 13 U/L (ref 0–37)
Albumin: 3.9 g/dL (ref 3.5–5.2)
Alkaline Phosphatase: 54 U/L (ref 39–117)
BUN: 9 mg/dL (ref 6–23)
CHLORIDE: 107 meq/L (ref 96–112)
CO2: 24 meq/L (ref 19–32)
CREATININE: 0.74 mg/dL (ref 0.40–1.20)
Calcium: 9 mg/dL (ref 8.4–10.5)
GFR: 92.09 mL/min (ref >60.00)
Glucose, Bld: 140 mg/dL — ABNORMAL HIGH (ref 70–99)
Potassium: 3.6 mEq/L (ref 3.5–5.1)
SODIUM: 138 meq/L (ref 135–145)
Total Bilirubin: 0.5 mg/dL (ref 0.2–1.2)
Total Protein: 6.5 g/dL (ref 6.0–8.3)

## 2016-04-03 LAB — POCT URINALYSIS DIPSTICK
Bilirubin, UA: NEGATIVE
GLUCOSE UA: NEGATIVE
Ketones, UA: NEGATIVE
Nitrite, UA: NEGATIVE
PH UA: 5.5
PROTEIN UA: NEGATIVE
RBC UA: NEGATIVE
Spec Grav, UA: 1.025
Urobilinogen, UA: 0.2

## 2016-04-03 LAB — CBC WITH DIFFERENTIAL/PLATELET
BASOS PCT: 0.3 % (ref 0.0–3.0)
Basophils Absolute: 0 10*3/uL (ref 0.0–0.1)
EOS ABS: 0.2 10*3/uL (ref 0.0–0.7)
Eosinophils Relative: 1.9 % (ref 0.0–5.0)
HCT: 39.6 % (ref 36.0–46.0)
Hemoglobin: 13.4 g/dL (ref 12.0–15.0)
LYMPHS ABS: 2.6 10*3/uL (ref 0.7–4.0)
Lymphocytes Relative: 29.6 % (ref 12.0–46.0)
MCHC: 33.9 g/dL (ref 30.0–36.0)
MCV: 84.9 fl (ref 78.0–100.0)
MONO ABS: 0.4 10*3/uL (ref 0.1–1.0)
Monocytes Relative: 4.9 % (ref 3.0–12.0)
NEUTROS ABS: 5.5 10*3/uL (ref 1.4–7.7)
NEUTROS PCT: 63.3 % (ref 43.0–77.0)
Platelets: 279 10*3/uL (ref 150.0–400.0)
RBC: 4.66 Mil/uL (ref 3.87–5.11)
RDW: 13.2 % (ref 11.5–15.5)
WBC: 8.7 10*3/uL (ref 4.0–10.5)

## 2016-04-03 LAB — LIPASE: Lipase: 31 U/L (ref 11.0–59.0)

## 2016-04-03 LAB — URINALYSIS, MICROSCOPIC ONLY: RBC / HPF: NONE SEEN (ref 0–?)

## 2016-04-03 IMAGING — CT CT ABD-PELV W/ CM
2 of 5 series · 16 of 46 positions shown, 18 images · IV contrast (iopamidol)
Comparison: None.

CLINICAL DATA: Left lower quadrant pain for 1 week.

EXAM:
CT ABDOMEN AND PELVIS WITH CONTRAST
TECHNIQUE: Multidetector CT imaging of the abdomen and pelvis was performed
using the standard protocol following bolus administration of
intravenous contrast.
CONTRAST:  100mL [FL] IOPAMIDOL ([FL]) INJECTION 61%

[Series 2: routine abd/pel with · axial · 0.72mm/px · z∈[-537,-87]mm · 13 of 102 slices shown, 15 images]
[im 6/102  soft-tissue]
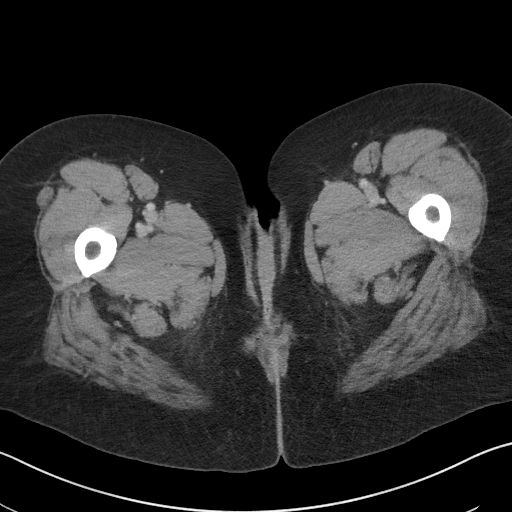
[im 6/102  bone]
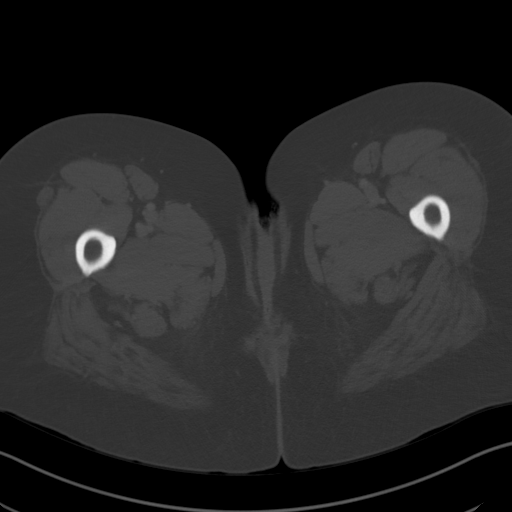
[im 16/102  soft-tissue]
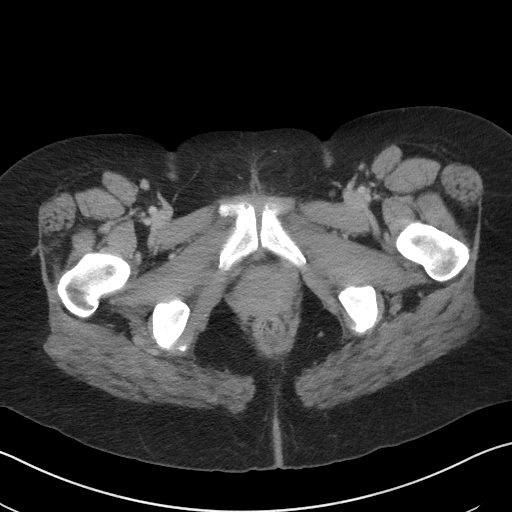
[im 22/102  soft-tissue]
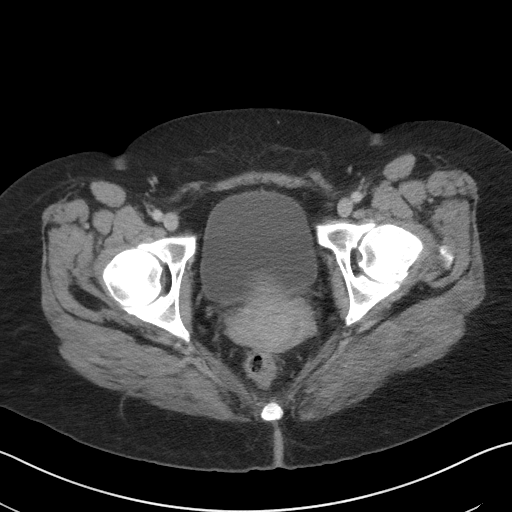
[im 27/102  soft-tissue]
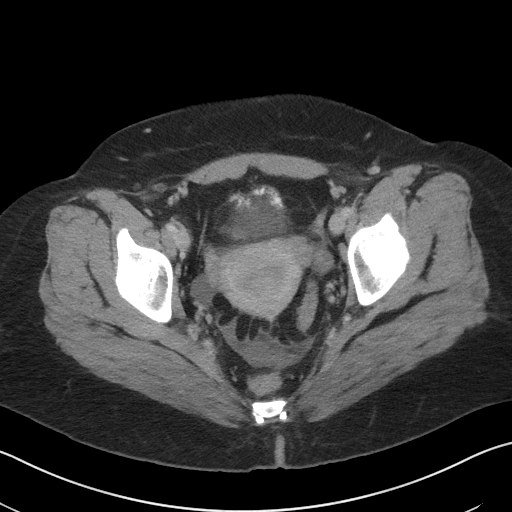
[im 38/102  soft-tissue]
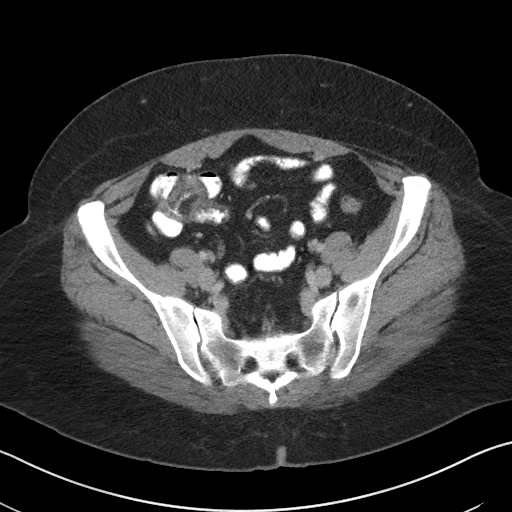
[im 43/102  soft-tissue]
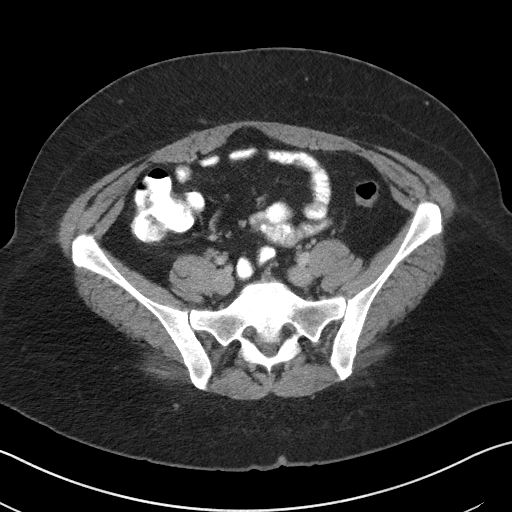
[im 54/102  soft-tissue]
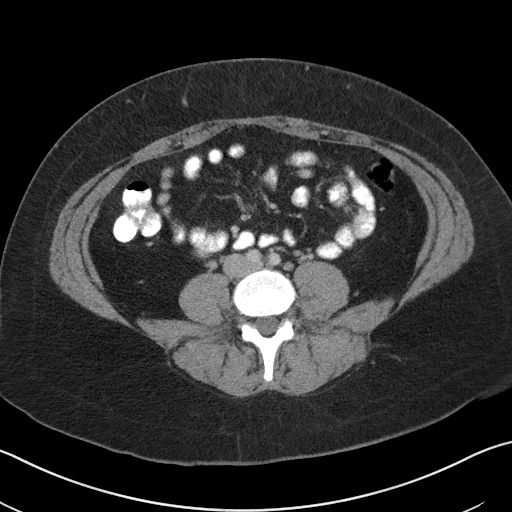
[im 59/102  soft-tissue]
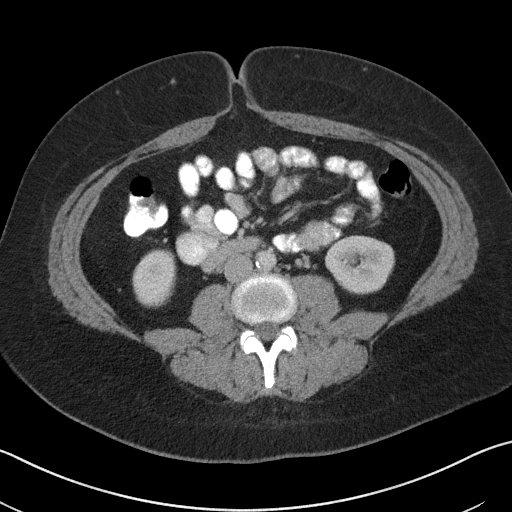
[im 64/102  soft-tissue]
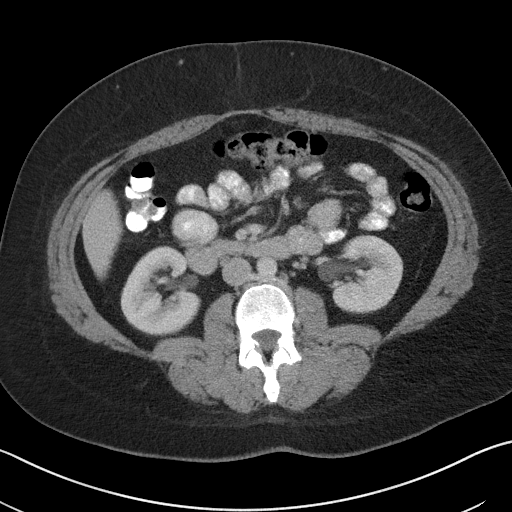
[im 64/102  bone]
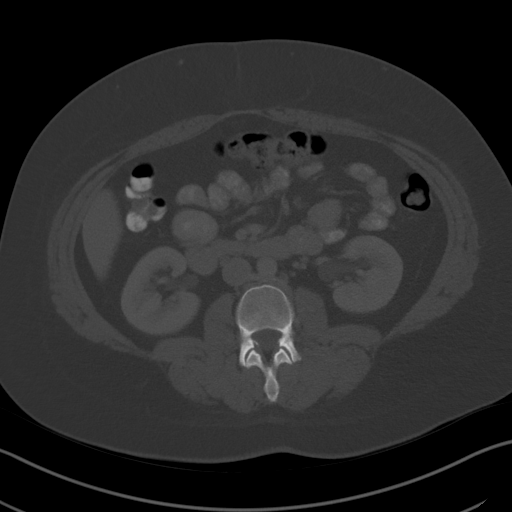
[im 75/102  soft-tissue]
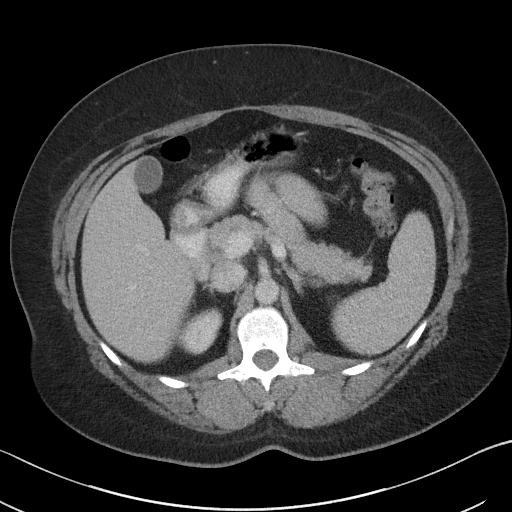
[im 80/102  soft-tissue]
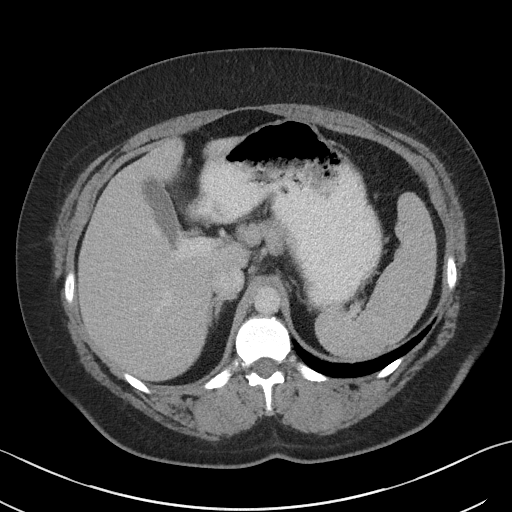
[im 86/102  soft-tissue]
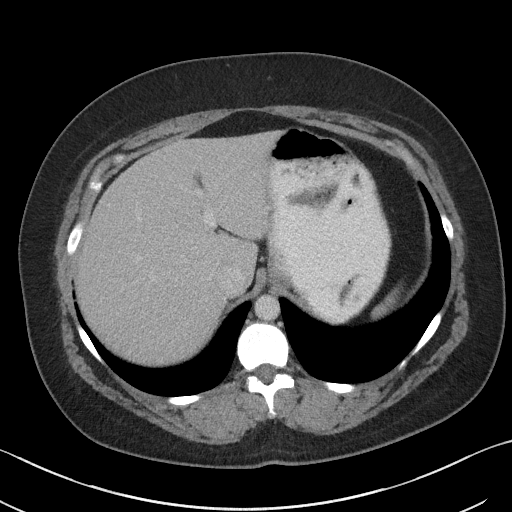
[im 96/102  soft-tissue]
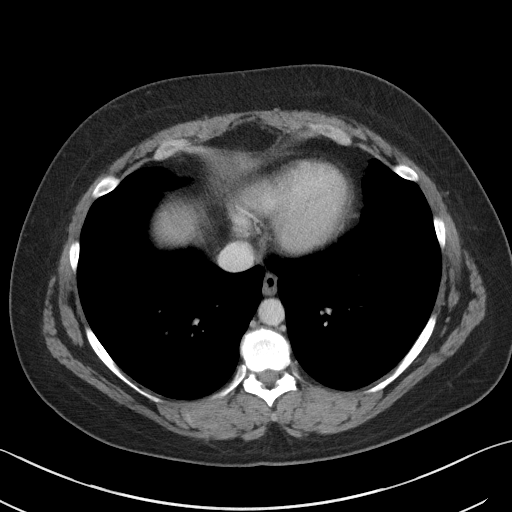

[Series 6: coronal st · coronal · 0.71mm/px · 3 of 101 slices shown]
[im 34/101  soft-tissue]
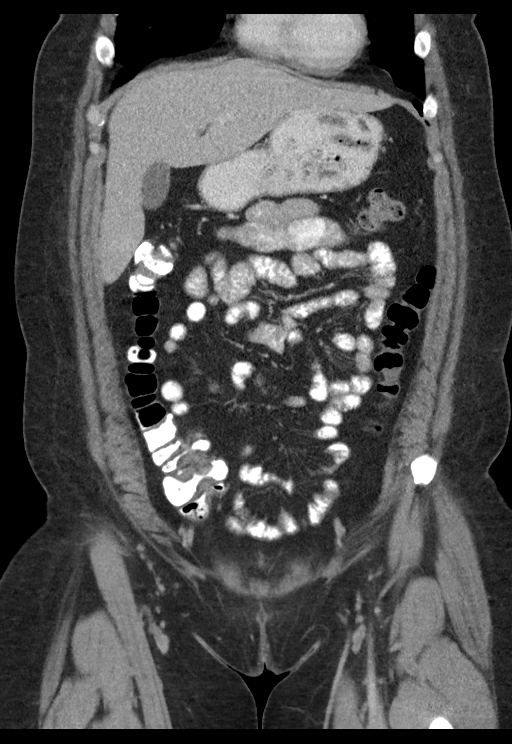
[im 45/101  soft-tissue]
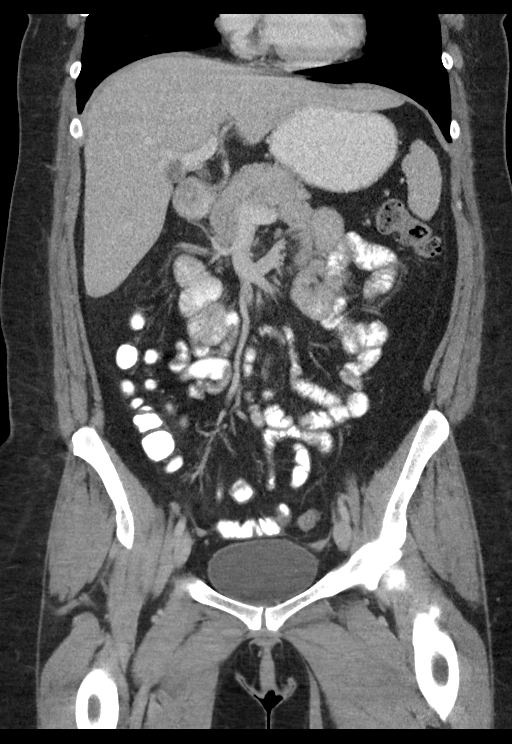
[im 56/101  soft-tissue]
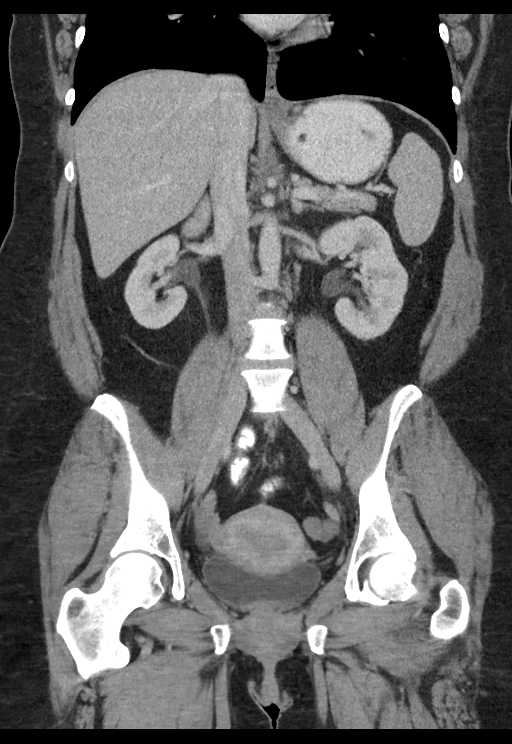

[16 of 46 positions shown; findings below may reference images not displayed]

FINDINGS: Lower Chest: No acute findings.

Hepatobiliary: No masses identified. Prior cholecystectomy noted. No
evidence of biliary dilatation.

Pancreas:  No mass or inflammatory changes.

Spleen: Within normal limits in size and appearance.

Adrenals/Urinary Tract: No masses identified. No evidence of
hydronephrosis.

Stomach/Bowel: No evidence of obstruction, inflammatory process or
abnormal fluid collections. Normal appendix visualized.

Vascular/Lymphatic: No pathologically enlarged lymph nodes. No
abdominal aortic aneurysm.

Reproductive: Normal appearance of uterus and ovaries. Small amount
of free fluid in pelvic cul-de-sac, most likely physiologic in a
reproductive age female.

Other:  None.

Musculoskeletal:  No suspicious bone lesions identified.
IMPRESSION: Small amount of free fluid in pelvis, most likely physiologic in a
reproductive age female. No evidence of mass, inflammatory process,
or other acute findings.

## 2016-04-03 IMAGING — DX DG HIP (WITH OR WITHOUT PELVIS) 4+V*L*
3 series · 3 of 3 positions shown · non-contrast
Comparison: None.

CLINICAL DATA: Lower abdominal pain, left hip pain

EXAM:
DG HIP (WITH OR WITHOUT PELVIS) 4+V LEFT

[pelvis ap]
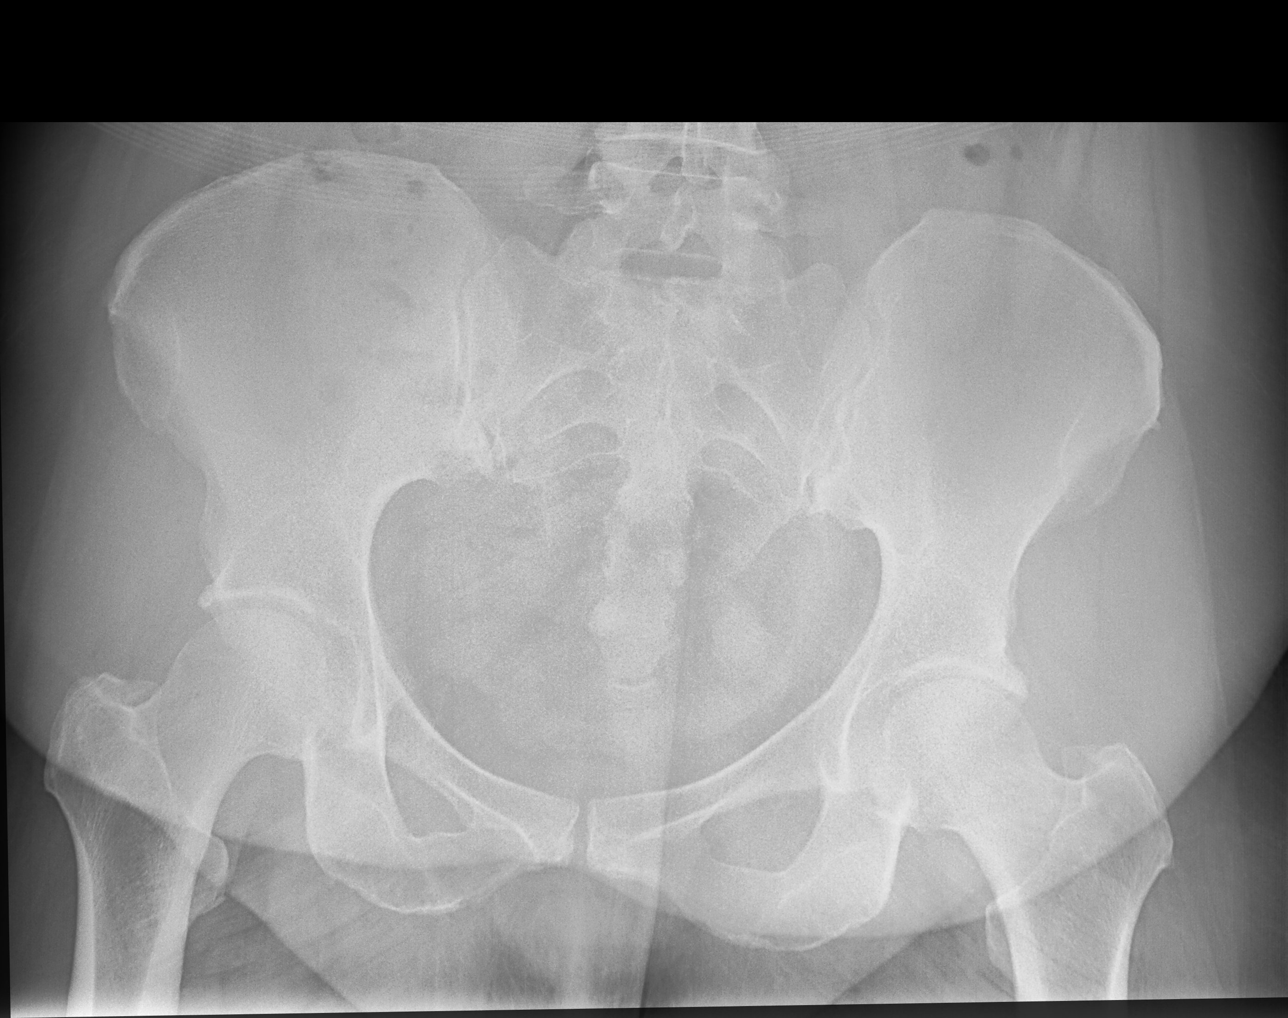

[hip joint ap]
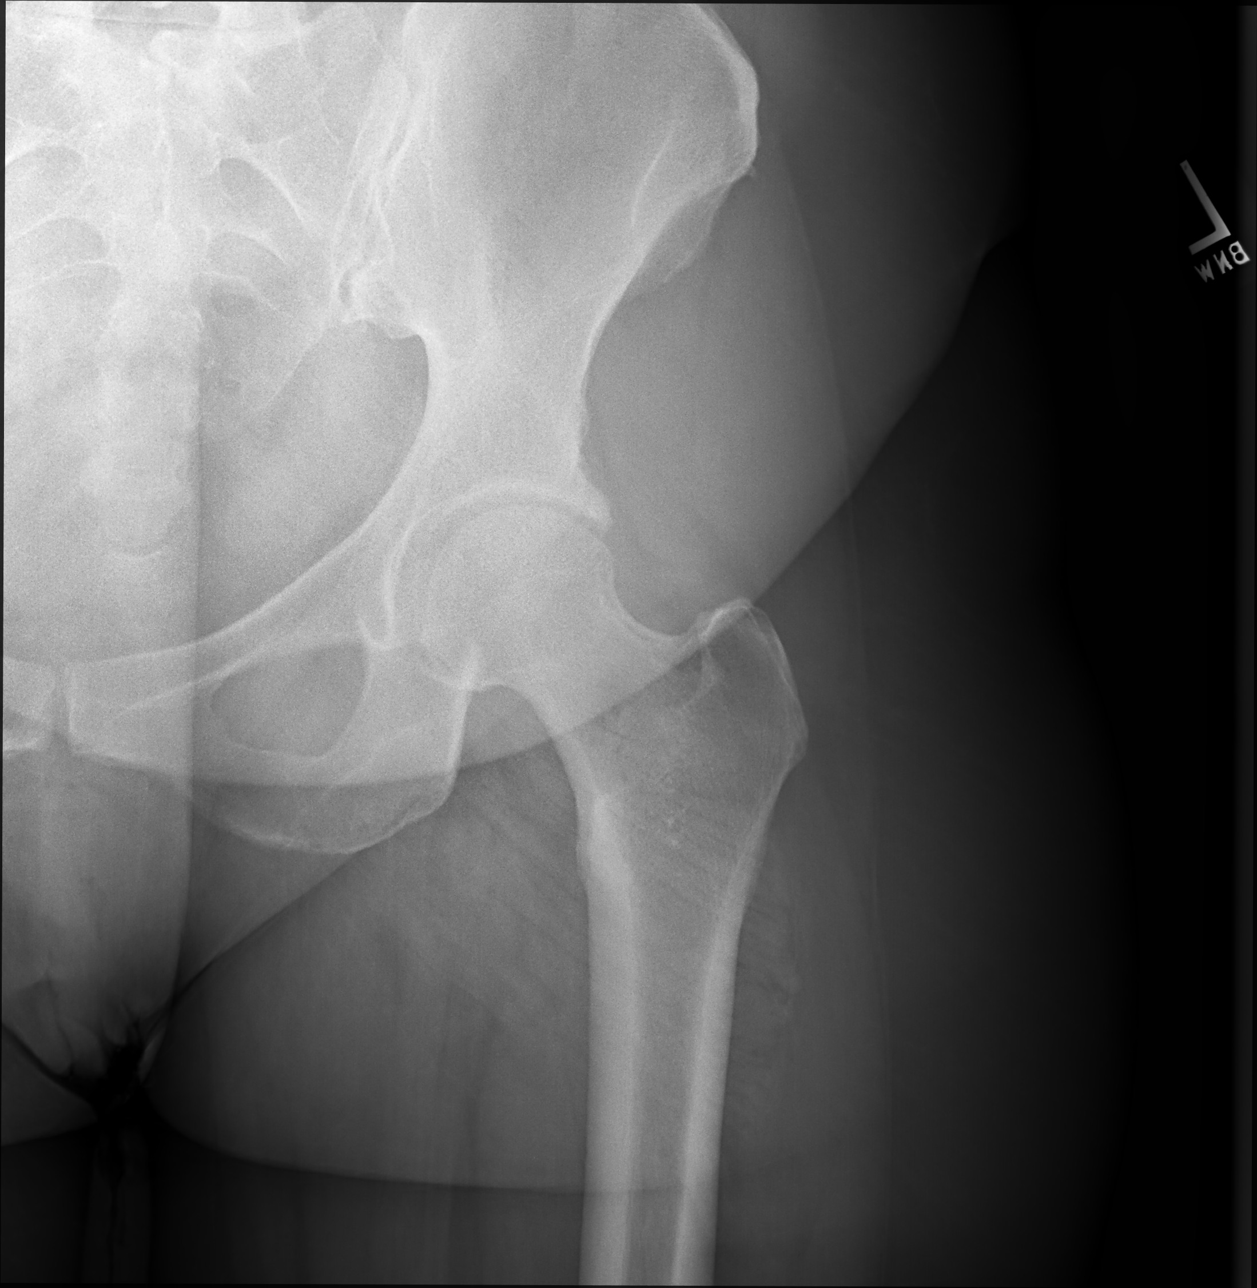

[ap frog obl (oblique)]
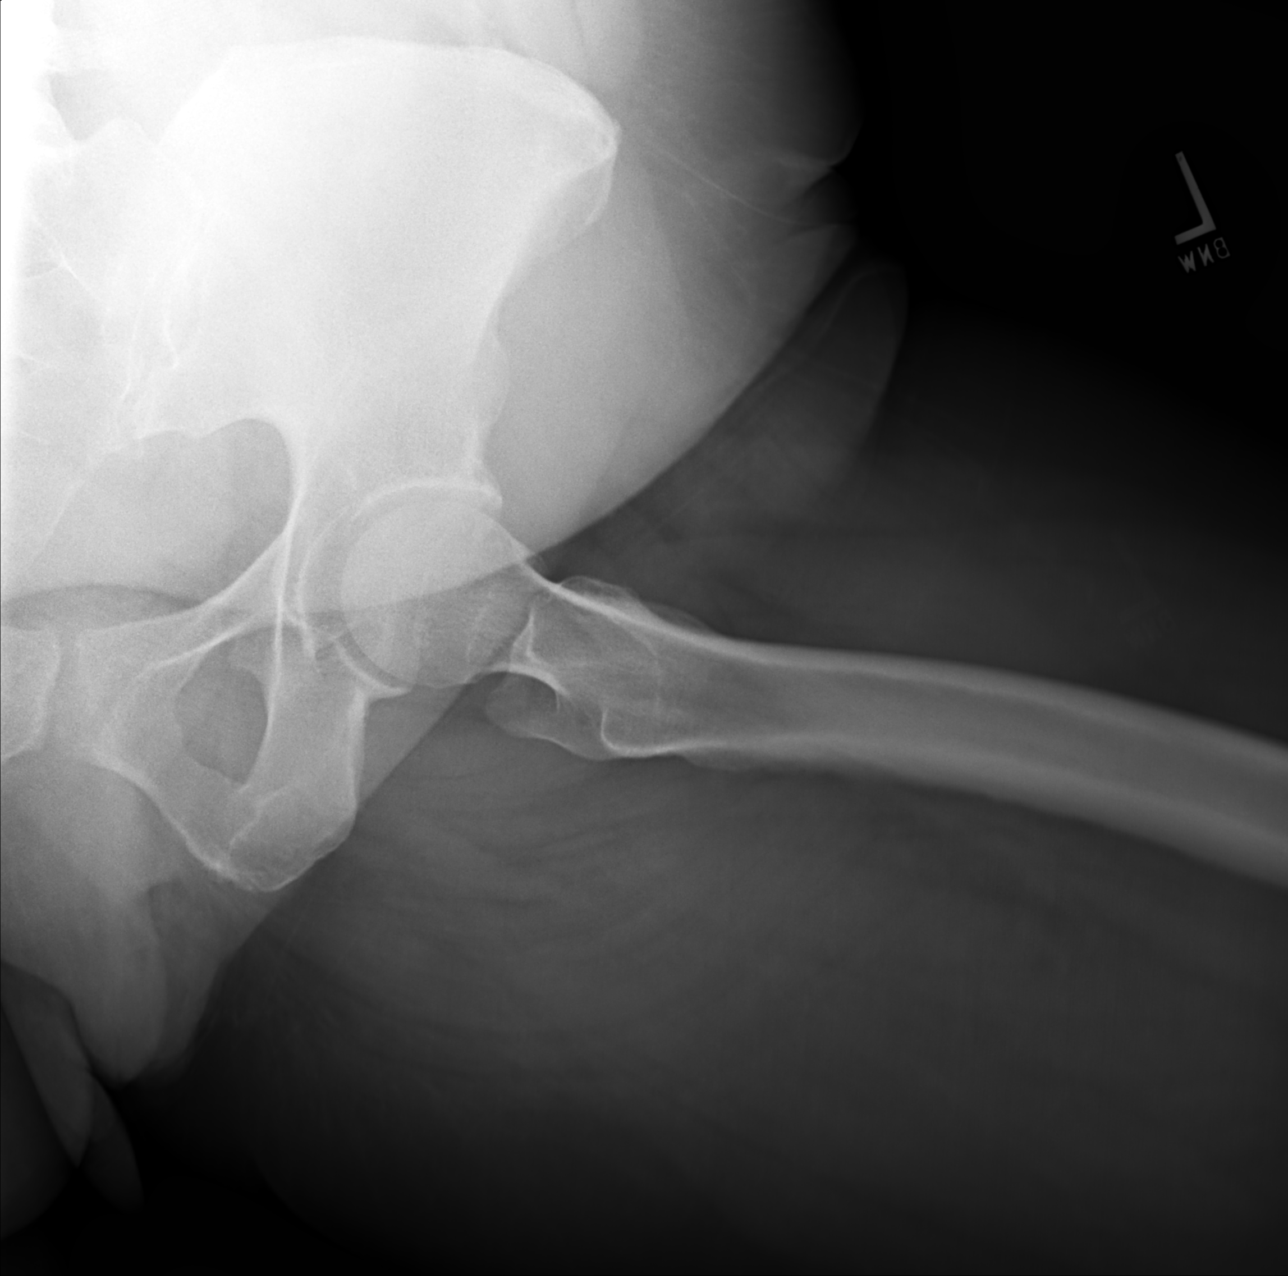

[3 of 3 positions shown; findings below may reference images not displayed]

FINDINGS: Three views of the left hip submitted. No acute fracture or
subluxation. Minimal superior acetabular spurring. Mild degenerative
changes pubic symphysis.
IMPRESSION: No acute fracture or subluxation. Minimal left superior acetabular
spurring. Mild degenerative changes pubic symphysis.

## 2016-04-03 MED ORDER — IOPAMIDOL (ISOVUE-300) INJECTION 61%
100.0000 mL | Freq: Once | INTRAVENOUS | Status: AC | PRN
  Administered 2016-04-03: 100 mL via INTRAVENOUS

## 2016-04-03 NOTE — Telephone Encounter (Signed)
Pt not answering- called to discuss insurance not covering CT

## 2016-04-03 NOTE — Telephone Encounter (Signed)
Please advise 

## 2016-04-03 NOTE — Progress Notes (Signed)
Pre visit review using our clinic review tool, if applicable. No additional management support is needed unless otherwise documented below in the visit note. 

## 2016-04-03 NOTE — Addendum Note (Signed)
Addended by: Leeanne Rio on: 04/03/2016 04:45 PM   Modules accepted: Orders

## 2016-04-03 NOTE — Patient Instructions (Signed)
Keep cell phone with you.   After this appt, please make follow up appt to follow blood pressure.   If there is no improvement in your symptoms, or if there is any worsening of symptoms, or if you have any additional concerns, please return for re-evaluation; or, if we are closed, consider going to the Emergency Room for evaluation if symptoms urgent.

## 2016-04-03 NOTE — Telephone Encounter (Signed)
Pt called back. Dr. Derrel Nip told me that I could tell her that her CT was normal and that Joycelyn Schmid would give her a call to discuss. Pt states that she would like Joycelyn Schmid to call her since she is still hurting.

## 2016-04-03 NOTE — Progress Notes (Signed)
Subjective:    Patient ID: Nicole Escobar, female    DOB: 1975/08/28, 41 y.o.   MRN: [id number]  CC: MAHSA BEGNOCHE is a 41 y.o. female who presents today for an acute visit.    HPI: CC: LLQ pain 7 days, worsening. Intermittent.10/10 pain. Tearful. Throb that radiates towards vagina and left leg. Pain worse when 'pushing on it.' Pain isn't worse with meals. Regular BMs. 800 ibuprofen 'helped a lot.' Feel 'like it's over my hip.' No fever, nausea, vomiting, chills, dysuria. No injury.   Works as Training and development officer at [employer]. Pain started before work last week.     HISTORY:  Past Medical History:  Diagnosis Date  . Emphysema of lung ([place])   . Frequent headaches    unknown onset   Past Surgical History:  Procedure Laterality Date  . TUBAL LIGATION  2013   Family History  Problem Relation Age of Onset  . Alcohol abuse Mother   . Arthritis Mother   . Cancer Mother     ovary  . Hyperlipidemia Mother   . Hypertension Mother   . Diabetes Mother   . Arthritis Maternal Grandmother   . Hypertension Maternal Grandmother   . Cancer Maternal Grandmother     colon    Allergies: Sulfa antibiotics Current Outpatient Prescriptions on File Prior to Visit  Medication Sig Dispense Refill  . amoxicillin (AMOXIL) 500 MG capsule Take 1 capsule (500 mg total) by mouth 3 (three) times daily. 30 capsule 0  . cyproheptadine (PERIACTIN) 4 MG tablet Take 1 tablet (4 mg total) by mouth 3 (three) times daily as needed for allergies. 30 tablet 0  . lidocaine (XYLOCAINE) 2 % solution Use as directed 20 mLs in the mouth or throat as needed for mouth pain. 100 mL 0  . predniSONE (DELTASONE) 10 MG tablet Take 1 tablet (10 mg total) by mouth 2 (two) times daily with a meal. 10 tablet 0  . ranitidine (ZANTAC) 150 MG tablet Take 1 tablet (150 mg total) by mouth 2 (two) times daily. 20 tablet 0  . [DISCONTINUED] albuterol (PROVENTIL HFA;VENTOLIN HFA) 108 (90 Base) MCG/ACT inhaler Inhale 2 puffs into the lungs every 6  (six) hours as needed for wheezing or shortness of breath. 1 Inhaler 0   No current facility-administered medications on file prior to visit.     Social History  Substance Use Topics  . Smoking status: Former Smoker    Quit date: 03/25/2003  . Smokeless tobacco: Never Used  . Alcohol use No    Review of Systems  Constitutional: Negative for chills and fever.  Respiratory: Negative for cough.   Cardiovascular: Negative for chest pain and palpitations.  Gastrointestinal: Positive for abdominal distention and abdominal pain. Negative for blood in stool, constipation, diarrhea, nausea and vomiting.  Genitourinary: Negative for dysuria and hematuria.  Musculoskeletal: Negative for back pain.      Objective:    BP (!) 148/88   Pulse 73   Temp 98.1 F (36.7 C) (Oral)   Ht 5\' 4"  (1.626 m)   Wt 209 lb 9.6 oz (95.1 kg)   SpO2 98%   BMI 35.98 kg/m    Physical Exam  Constitutional: She appears well-developed and well-nourished.  Eyes: Conjunctivae are normal.  Cardiovascular: Normal rate, regular rhythm, normal heart sounds and normal pulses.   Pulmonary/Chest: Effort normal and breath sounds normal. She has no wheezes. She has no rhonchi. She has no rales.  Abdominal: Soft. Normal appearance and bowel sounds are  normal. She exhibits no distension, no fluid wave, no ascites and no mass. There is tenderness in the left lower quadrant. There is no rigidity, no rebound, no guarding, no CVA tenderness, no tenderness at McBurney's point and negative Murphy's sign.    Musculoskeletal:       Left hip: She exhibits bony tenderness. She exhibits normal range of motion, normal strength and no tenderness.       Legs: LeftHip: No limp or waddling gait. Full ROM with flexion and hip rotation in flexion.  Anterior hip pain radiating to left thigh with  (flexion-abduction-external rotation) test. No pain with deep palpation of greater trochanter.    Neurological: She is alert.  Skin: Skin is warm  and dry.  Psychiatric: She has a normal mood and affect. Her speech is normal and behavior is normal. Thought content normal.  Vitals reviewed.      Assessment & Plan:   1. Lower abdominal pain Etiology of pain is not specific at this point. Considering a hip pathology versus abdominal including diverticulitis. Patient is very tearful in the room and with pain being 10 out of 10, we jointly agreed to pursue CT abdomen pelvis as well as x-ray of left hip. Advised patient to keep cell phone with her today as results are coming in. Note, suspect blood pressures elevated due to intense pain. Advised patient to make follow-up appointment to monitor blood pressure. - POCT Urinalysis Dipstick - Urine culture - Urine Microscopic - DG HIP UNILAT W OR W/O PELVIS MIN 4 VIEWS LEFT - CT ABDOMEN PELVIS W CONTRAST - CBC with Differential/Platelet - Comprehensive metabolic panel - Lipase    I am having Ms. Shevlin maintain her lidocaine, ranitidine, predniSONE, cyproheptadine, and amoxicillin.   No orders of the defined types were placed in this encounter.   Return precautions given.   Risks, benefits, and alternatives of the medications and treatment plan prescribed today were discussed, and patient expressed understanding.   Education regarding symptom management and diagnosis given to patient on AVS.  Continue to follow with Mable Paris, FNP for routine health maintenance.   Nicole Escobar and I agreed with plan.   Mable Paris, FNP

## 2016-04-03 NOTE — Telephone Encounter (Signed)
Nicole Bound, do you not need to call patient regarding results.  Called pt from my cell phone this afternoon and discussed results of X-ray and CT. Explained no acute findings.  We jointly agreed suspect muscle strain or underlying arthritis as probable causes.  Lab work unrevealing and normal as well.   Pt is going to stay home from work tomorrow and rest. She will let us know next week how she is doing.

## 2016-04-03 NOTE — Telephone Encounter (Signed)
Called her. Notes in chart

## 2016-04-03 NOTE — Telephone Encounter (Signed)
fyi when pt calls back  Tried to call pt and unable to leave message- also tried [phone].  I wanted to discuss results of CT abdomen which so no acute findings.  Her XR of left hip shows no fracture  Pending lab work, urine however I suspect a  Muscle strain over left hip.   As ibuprofen has worked for patient in past, I advise to continue as no indication for stronger medication at this time.   Alternating heat and ice.

## 2016-04-03 NOTE — Telephone Encounter (Signed)
Tried calling pt at 12:10 pm and again at 1:10 pm. No vm or answer.

## 2016-04-03 NOTE — Telephone Encounter (Signed)
Pt called looking for test results. PT has to run to go pick up her son, will be back in 30 mins. Please advise, thank you!  Call pt @ [phone]

## 2016-04-04 ENCOUNTER — Telehealth: Payer: Self-pay

## 2016-04-04 DIAGNOSIS — M25552 Pain in left hip: Secondary | ICD-10-CM

## 2016-04-04 LAB — URINE CULTURE

## 2016-04-04 MED ORDER — IBUPROFEN 800 MG PO TABS: 800.0000 mg | ORAL_TABLET | Freq: Three times a day (TID) | ORAL | 0 refills | Status: DC | PRN

## 2016-04-04 NOTE — Addendum Note (Signed)
Addended by: Burnard Hawthorne on: 04/04/2016 03:01 PM   Modules accepted: Orders

## 2016-04-04 NOTE — Telephone Encounter (Signed)
Call pt   Let her know I sent in high dose ibuprofen for hip pain.   TAKE WITH FOOD due to risk of GI ulcer,upset, bleed.  Take it easy as she can at work this weekend.

## 2016-04-04 NOTE — Telephone Encounter (Signed)
Patient has been notified

## 2016-04-04 NOTE — Telephone Encounter (Signed)
Patient stated she has nothing for the pain and she is working this weekend. Patient is wondering if you could place a pain medication in.  Patient stated that both you and her spoke about it but nothing came about it. Please advise.

## 2016-04-05 LAB — PAIN MGMT PROF 3 W/O CONF, U
AMPHETAMINES: NEGATIVE ng/mL (ref <500)
Benzodiazepines: NEGATIVE ng/mL (ref <100)
CREATININE: 201.5 mg/dL (ref >=20.0)
Cocaine Metabolite: NEGATIVE ng/mL (ref <150)
Marijuana Metabolite: POSITIVE ng/mL — AB (ref <20)
OPIATES: NEGATIVE ng/mL (ref <100)
OXYCODONE: NEGATIVE ng/mL (ref <100)
Oxidant: NEGATIVE ug/mL (ref <200)
pH: 6.07 (ref 4.5–9.0)

## 2016-04-08 ENCOUNTER — Telehealth: Payer: Self-pay | Admitting: Family

## 2016-04-08 NOTE — Telephone Encounter (Signed)
Pt called back in regards to results. Please advise, thank you!  Call pt @ [phone]

## 2016-04-11 NOTE — Telephone Encounter (Signed)
Patient was informed of results.  Patient understood and no questions, comments, or concerns at this time.  

## 2016-04-24 ENCOUNTER — Ambulatory Visit (INDEPENDENT_AMBULATORY_CARE_PROVIDER_SITE_OTHER): Payer: BLUE CROSS/BLUE SHIELD

## 2016-04-24 DIAGNOSIS — Z23 Encounter for immunization: Secondary | ICD-10-CM

## 2016-04-24 NOTE — Progress Notes (Signed)
Patient came in today for a flu shot. Injected into right deltoid. Patient tolerated injection well.

## 2016-07-01 ENCOUNTER — Encounter: Payer: Self-pay | Admitting: Family

## 2016-07-01 ENCOUNTER — Ambulatory Visit (INDEPENDENT_AMBULATORY_CARE_PROVIDER_SITE_OTHER): Payer: BLUE CROSS/BLUE SHIELD | Admitting: Family

## 2016-07-01 VITALS — BP 126/60 | HR 86 | Temp 98.3°F | Ht 64.0 in | Wt 204.6 lb

## 2016-07-01 DIAGNOSIS — J029 Acute pharyngitis, unspecified: Secondary | ICD-10-CM | POA: Diagnosis not present

## 2016-07-01 DIAGNOSIS — R21 Rash and other nonspecific skin eruption: Secondary | ICD-10-CM | POA: Diagnosis not present

## 2016-07-01 LAB — POCT RAPID STREP A (OFFICE): Rapid Strep A Screen: NEGATIVE

## 2016-07-01 MED ORDER — TRIAMCINOLONE 0.1 % CREAM:EUCERIN CREAM 1:1: 1.0000 "application " | TOPICAL_CREAM | Freq: Two times a day (BID) | CUTANEOUS | 1 refills | Status: DC

## 2016-07-01 NOTE — Patient Instructions (Signed)
Trial eucerin with steriod for hands Avoid clorox as much as you can If doesn't improve, let me know and I will consult dermatology  Start claritin for nasal congestion- suspect viral or allergies  Also let me know if not better

## 2016-07-01 NOTE — Progress Notes (Signed)
Subjective:    Patient ID: Nicole Escobar, female    DOB: 1975/12/22, 41 y.o.   MRN: [id number]  CC: [name] is a 41 y.o. female who presents today for an acute visit.    HPI: CC:  runny nose x 2 days ago,  improving. Endorses sore throat. Little cough. Tried nyquil with relief. NO fever, ear pain, sinus pain.    Rash on hands x one week, waxing and waning.  Gets rash from clorox towel and 'hands stay in hot water. '  Notes on 3rd and 4th and 5th finger. Comes and goes. Improves when away from clorox.  Very itchy. Describes clear blisters. No fever, joint pain. Rash isn't anywhere else.   Was seen by dermatology in past and told she had eczema - approx 20 years ago. Hasnt tried any medication OTC.     HISTORY:  Past Medical History:  Diagnosis Date  . Emphysema of lung ([place])   . Frequent headaches    unknown onset   Past Surgical History:  Procedure Laterality Date  . TONSILECTOMY/ADENOIDECTOMY WITH MYRINGOTOMY Bilateral   . TUBAL LIGATION  2013   Family History  Problem Relation Age of Onset  . Alcohol abuse Mother   . Arthritis Mother   . Cancer Mother     ovary  . Hyperlipidemia Mother   . Hypertension Mother   . Diabetes Mother   . Arthritis Maternal Grandmother   . Hypertension Maternal Grandmother   . Cancer Maternal Grandmother     colon    Allergies: Sulfa antibiotics Current Outpatient Prescriptions on File Prior to Visit  Medication Sig Dispense Refill  . amoxicillin (AMOXIL) 500 MG capsule Take 1 capsule (500 mg total) by mouth 3 (three) times daily. 30 capsule 0  . cyproheptadine (PERIACTIN) 4 MG tablet Take 1 tablet (4 mg total) by mouth 3 (three) times daily as needed for allergies. 30 tablet 0  . ibuprofen (ADVIL,MOTRIN) 800 MG tablet Take 1 tablet (800 mg total) by mouth every 8 (eight) hours as needed. 30 tablet 0  . lidocaine (XYLOCAINE) 2 % solution Use as directed 20 mLs in the mouth or throat as needed for mouth pain. 100 mL 0  . predniSONE  (DELTASONE) 10 MG tablet Take 1 tablet (10 mg total) by mouth 2 (two) times daily with a meal. 10 tablet 0  . ranitidine (ZANTAC) 150 MG tablet Take 1 tablet (150 mg total) by mouth 2 (two) times daily. 20 tablet 0  . [DISCONTINUED] albuterol (PROVENTIL HFA;VENTOLIN HFA) 108 (90 Base) MCG/ACT inhaler Inhale 2 puffs into the lungs every 6 (six) hours as needed for wheezing or shortness of breath. 1 Inhaler 0   No current facility-administered medications on file prior to visit.     Social History  Substance Use Topics  . Smoking status: Former Smoker    Quit date: 03/25/2003  . Smokeless tobacco: Never Used  . Alcohol use No    Review of Systems  Constitutional: Negative for chills and fever.  HENT: Positive for rhinorrhea and sore throat. Negative for congestion.   Respiratory: Positive for cough. Negative for shortness of breath and wheezing.   Cardiovascular: Negative for chest pain and palpitations.  Gastrointestinal: Negative for nausea and vomiting.  Musculoskeletal: Negative for arthralgias.  Skin: Positive for rash.      Objective:    BP 126/60   Pulse 86   Temp 98.3 F (36.8 C)   Ht 5\' 4"  (1.626 m)   Wt  204 lb 9.6 oz (92.8 kg)   SpO2 97%   BMI 35.12 kg/m    Physical Exam  Constitutional: She appears well-developed and well-nourished.  HENT:  Head: Normocephalic and atraumatic.  Right Ear: Hearing, tympanic membrane, external ear and ear canal normal. No drainage, swelling or tenderness. No foreign bodies. Tympanic membrane is not erythematous and not bulging. No middle ear effusion. No decreased hearing is noted.  Left Ear: Hearing, tympanic membrane, external ear and ear canal normal. No drainage, swelling or tenderness. No foreign bodies. Tympanic membrane is not erythematous and not bulging.  No middle ear effusion. No decreased hearing is noted.  Nose: Nose normal. No rhinorrhea. Right sinus exhibits no maxillary sinus tenderness and no frontal sinus tenderness.  Left sinus exhibits no maxillary sinus tenderness and no frontal sinus tenderness.  Mouth/Throat: Uvula is midline, oropharynx is clear and moist and mucous membranes are normal. No oropharyngeal exudate, posterior oropharyngeal edema, posterior oropharyngeal erythema or tonsillar abscesses.  Eyes: Conjunctivae are normal.  Cardiovascular: Regular rhythm, normal heart sounds and normal pulses.   Pulmonary/Chest: Effort normal and breath sounds normal. She has no wheezes. She has no rhonchi. She has no rales.  Lymphadenopathy:       Head (right side): No submental, no submandibular, no tonsillar, no preauricular, no posterior auricular and no occipital adenopathy present.       Head (left side): No submental, no submandibular, no tonsillar, no preauricular, no posterior auricular and no occipital adenopathy present.    She has no cervical adenopathy.  Neurological: She is alert.  Skin: Skin is warm and dry. Rash noted. Rash is maculopapular and vesicular.  Maculopapular lesions predominant, few vesicles noted right hand over 3rd, 4th, and 5th metacarpal. Scattered, however predominant on lateral sides of metacarpal.   Psychiatric: She has a normal mood and affect. Her speech is normal and behavior is normal. Thought content normal.  Vitals reviewed.      Assessment & Plan:  1. Sorethroat X 2 days. Negative strep. Suspect viral etiology. Patient and I jointly agreed on conservative therapy at this time. Return precautions given. - POCT rapid strep A  2. Rash New. 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 first and foremost discussed wearing gloves, using a clean, dry rash to pick up other rags with Clorox. Generally avoiding Clorox. Gave her a short course of topical steroid with Eucerin and discussed using an emollient at home and avoiding long hot showers. If this is not resolve symptoms, advised patient that I would like  for her to go to dermatology for another consult. She agreed with plan.   - Triamcinolone Acetonide (TRIAMCINOLONE 0.1 % CREAM : EUCERIN) CREA; Apply 1 application topically 2 (two) times daily.  Dispense: 1 each; Refill: 1     I am having Ms. Westerhoff start on triamcinolone 0.1 % cream : eucerin. I am also having her maintain her lidocaine, ranitidine, predniSONE, cyproheptadine, amoxicillin, and ibuprofen.   Meds ordered this encounter  Medications  . Triamcinolone Acetonide (TRIAMCINOLONE 0.1 % CREAM : EUCERIN) CREA    Sig: Apply 1 application topically 2 (two) times daily.    Dispense:  1 each    Refill:  1    Order Specific Question:   Supervising Provider    Answer:   Crecencio Mc [[id number]]    Return precautions given.   Risks, benefits, and alternatives of the medications and treatment plan prescribed today were discussed, and  patient expressed understanding.   Education regarding symptom management and diagnosis given to patient on AVS.  Continue to follow with Mable Paris, FNP for routine health maintenance.   Nicole Escobar and I agreed with plan.   Mable Paris, FNP

## 2016-07-01 NOTE — Progress Notes (Signed)
Pre visit review using our clinic review tool, if applicable. No additional management support is needed unless otherwise documented below in the visit note. 

## 2016-07-02 ENCOUNTER — Encounter: Payer: Self-pay | Admitting: Family

## 2016-07-03 ENCOUNTER — Encounter: Payer: Self-pay | Admitting: Family

## 2016-07-03 ENCOUNTER — Telehealth: Payer: Self-pay | Admitting: Family

## 2016-07-03 MED ORDER — AMOXICILLIN 500 MG PO CAPS: 500.0000 mg | ORAL_CAPSULE | Freq: Two times a day (BID) | ORAL | 0 refills | Status: DC

## 2016-07-03 NOTE — Telephone Encounter (Signed)
Since symptoms are 3 days day along, and congestion better, I would advise conservative therapy. Salt water gargles,. Cough lozenges.   I called in lidocaine swish and swallow for pain  If symptoms persists, then I would start amoxicillin however likely viral   If patient uncomfortable with this, I will order amoxicillin and please advise probiotics

## 2016-07-03 NOTE — Telephone Encounter (Signed)
Patient advised of below and verbalized understanding she would like to try amoxicillin since having so much tonsils are swollen on right side.   Please call in amoxicillin. Thanks.

## 2016-07-03 NOTE — Telephone Encounter (Signed)
Pt called and stated that her cold symptoms are gettgin better but her thraot feels worse. Please advise, thank you!  Call pt [phone]  [place], [place]

## 2016-07-03 NOTE — Telephone Encounter (Signed)
Reason for call: sore throat Symptoms: right side of throat red , nasal congestion, nasal drainage head congestion , no fever  No cough Medications: Flonase used once  , Nyquil X 3 nights  Last seen for this problem: Seen by: Joycelyn Schmid  Please advise

## 2016-07-08 ENCOUNTER — Encounter: Payer: Self-pay | Admitting: Family

## 2016-07-08 ENCOUNTER — Telehealth: Payer: Self-pay | Admitting: Family

## 2016-07-08 DIAGNOSIS — L209 Atopic dermatitis, unspecified: Secondary | ICD-10-CM

## 2016-07-08 NOTE — Telephone Encounter (Signed)
Pt called and stated that her eczema has gotten worse, and is on her other hand. She wants to know if she needs a referral or can she make the appt her self. Please advise, thank you@  Call [phone]

## 2016-07-08 NOTE — Telephone Encounter (Signed)
Please advise 

## 2016-07-17 ENCOUNTER — Telehealth: Payer: Self-pay | Admitting: Family

## 2016-07-17 NOTE — Telephone Encounter (Signed)
Patient has taken and has finished abx.  Patient does not have anymore sx.

## 2016-07-17 NOTE — Telephone Encounter (Signed)
Call pt  Ensure she got mychart  Amoxicillin has been sent to [facility] for you.   Let us know if not better   margaret

## 2016-11-18 ENCOUNTER — Encounter: Payer: Self-pay | Admitting: Family Medicine

## 2016-11-18 ENCOUNTER — Ambulatory Visit (INDEPENDENT_AMBULATORY_CARE_PROVIDER_SITE_OTHER): Payer: BLUE CROSS/BLUE SHIELD | Admitting: Family Medicine

## 2016-11-18 DIAGNOSIS — H9202 Otalgia, left ear: Secondary | ICD-10-CM

## 2016-11-18 NOTE — Progress Notes (Signed)
  Tommi Rumps, MD Phone: [phone]  Nicole Escobar is a 41 y.o. female who presents today for same-day visit.  Left ear pain: Patient notes for last 4-5 days her left ear has been bothering her. It is the anterior external portion just anterior to the ear canal at the tragus. She does note intermittently feeling that her hearing is slightly decreased in that ear. Notes no ringing. No fevers. No drainage. No upper respiratory congestion. No tooth pain. No drainage from her gums. She thinks she may have slept on her pillow wrong. She does note she has been digging in her left ear and has gotten quite a bit of wax out over the last several days.  ROS see history of present illness  Objective  Physical Exam Vitals:   11/18/16 1103  BP: (!) 130/94  Pulse: 71  Temp: 97.8 F (36.6 C)  SpO2: 97%    BP Readings from Last 3 Encounters:  11/18/16 (!) 130/94  07/01/16 126/60  04/03/16 (!) 148/88   Wt Readings from Last 3 Encounters:  11/18/16 209 lb 6.4 oz (95 kg)  07/01/16 204 lb 9.6 oz (92.8 kg)  04/03/16 209 lb 9.6 oz (95.1 kg)    Physical Exam  Constitutional: No distress.  HENT:  Head: Normocephalic and atraumatic.  Ears:  Mouth/Throat: Oropharynx is clear and moist. No oropharyngeal exudate.  Normal TMs bilaterally, normal ear canals bilaterally, no visual or palpable abnormalities of the external ear, slight tenderness just posterior to the left TMJ, no tenderness of the right TMJ, no clicking of TMJ bilaterally, patient with poor dentition throughout her mouth though no gingival swelling or drainage, no gingival tenderness in the left upper portion of her mouth  Eyes: Pupils are equal, round, and reactive to light. Conjunctivae are normal.  Neck: Neck supple.  Cardiovascular: Normal rate, regular rhythm and normal heart sounds.   Pulmonary/Chest: Effort normal and breath sounds normal.  Lymphadenopathy:    She has no cervical adenopathy.  Skin: Skin is warm and dry.  She is not diaphoretic.     Assessment/Plan: Please see individual problem list.  Left ear pain No signs of infection. Slight tenderness at the tragus on the left though no discomfort on tugging of the auricle. Potentially could be TMJ versus other inflammation. No palpable lymph nodes in this area. She does have poor dentition though there are no signs of dental infection and it would be odd to cause ear pain. Could be eustachian tube dysfunction. I advised that she see a dentist. Discussed ibuprofen for discomfort possibly related to TMJ. Discussed Flonase to help with potential eustachian tube dysfunction. If worsens or develops new symptoms she'll be reevaluated.    I delivered the patient's AVS to her and she was crying. I asked her what was wrong and she noted she was upset that I told her that nothing was wrong with her. I advised that I did not tell her there is nothing wrong and that we discussed several potential options for causes of her discomfort. she states she was upset that she has pain and I was not able to tell her that there was an infection. She stated she wanted an antibiotic and I advised there was no indication for an antibiotic at this time given that there were no signs of infection. I offered her a referral to ENT. She declined this and said fine and left the room.   Tommi Rumps, MD [place]

## 2016-11-18 NOTE — Patient Instructions (Signed)
Nice to see you. Your discomfort could be related to TMJ or inflammation. You do not appear to have an ear infection. You can try ibuprofen over-the-counter for this. You could also try adding Flonase in case it is related to eustachian tube dysfunction. You need to see a dentist as well.

## 2016-11-18 NOTE — Assessment & Plan Note (Addendum)
No signs of infection. Slight tenderness at the tragus on the left though no discomfort on tugging of the auricle. Potentially could be TMJ versus other inflammation. No palpable lymph nodes in this area. She does have poor dentition though there are no signs of dental infection and it would be odd to cause ear pain. Could be eustachian tube dysfunction. I advised that she see a dentist. Discussed ibuprofen for discomfort possibly related to TMJ. Discussed Flonase to help with potential eustachian tube dysfunction. If worsens or develops new symptoms she'll be reevaluated.

## 2017-01-12 ENCOUNTER — Ambulatory Visit (INDEPENDENT_AMBULATORY_CARE_PROVIDER_SITE_OTHER): Payer: BLUE CROSS/BLUE SHIELD

## 2017-01-12 DIAGNOSIS — Z23 Encounter for immunization: Secondary | ICD-10-CM | POA: Diagnosis not present

## 2017-02-19 DIAGNOSIS — J029 Acute pharyngitis, unspecified: Secondary | ICD-10-CM | POA: Diagnosis not present

## 2018-01-26 ENCOUNTER — Ambulatory Visit (INDEPENDENT_AMBULATORY_CARE_PROVIDER_SITE_OTHER): Payer: BLUE CROSS/BLUE SHIELD

## 2018-01-26 DIAGNOSIS — Z23 Encounter for immunization: Secondary | ICD-10-CM | POA: Diagnosis not present

## 2018-02-08 ENCOUNTER — Other Ambulatory Visit: Payer: Self-pay | Admitting: Family

## 2018-02-08 ENCOUNTER — Ambulatory Visit (INDEPENDENT_AMBULATORY_CARE_PROVIDER_SITE_OTHER): Payer: BLUE CROSS/BLUE SHIELD | Admitting: Family

## 2018-02-08 ENCOUNTER — Encounter: Payer: Self-pay | Admitting: Family

## 2018-02-08 VITALS — BP 130/86 | HR 76 | Temp 98.0°F | Resp 15 | Ht 64.0 in | Wt 213.4 lb

## 2018-02-08 DIAGNOSIS — R1030 Lower abdominal pain, unspecified: Secondary | ICD-10-CM | POA: Insufficient documentation

## 2018-02-08 DIAGNOSIS — L0292 Furuncle, unspecified: Secondary | ICD-10-CM

## 2018-02-08 DIAGNOSIS — F4320 Adjustment disorder, unspecified: Secondary | ICD-10-CM | POA: Insufficient documentation

## 2018-02-08 DIAGNOSIS — F4321 Adjustment disorder with depressed mood: Secondary | ICD-10-CM | POA: Insufficient documentation

## 2018-02-08 DIAGNOSIS — F411 Generalized anxiety disorder: Secondary | ICD-10-CM | POA: Diagnosis not present

## 2018-02-08 DIAGNOSIS — R079 Chest pain, unspecified: Secondary | ICD-10-CM | POA: Insufficient documentation

## 2018-02-08 DIAGNOSIS — R030 Elevated blood-pressure reading, without diagnosis of hypertension: Secondary | ICD-10-CM

## 2018-02-08 MED ORDER — SERTRALINE HCL 50 MG PO TABS: 50.0000 mg | ORAL_TABLET | Freq: Every day | ORAL | 3 refills | Status: DC

## 2018-02-08 MED ORDER — CEPHALEXIN 500 MG PO CAPS: 500.0000 mg | ORAL_CAPSULE | Freq: Four times a day (QID) | ORAL | 0 refills | Status: DC

## 2018-02-08 MED ORDER — LISINOPRIL 5 MG PO TABS: 5.0000 mg | ORAL_TABLET | Freq: Every day | ORAL | 3 refills | Status: DC

## 2018-02-08 NOTE — Patient Instructions (Addendum)
Start kelfex  Ensure to take probiotics while on antibiotics and also for 2 weeks after completion. It is important to re-colonize the gut with good bacteria and also to prevent any diarrheal infections associated with antibiotic use.   Start zoloft at bedtime.   Let me know how you are doing  Start lisinopril for blood pressure. Return in 5 days for fasting lab appointment to monitor kidney function on new medication  Today we discussed referrals, orders. CARDIOLOGY   I have placed these orders in the system for you.  Please be sure to give [place] a call if you have not heard from our office regarding this. We should hear from [place] within ONE week with information regarding your appointment. If not, please let me know immediately.    If you have ANY Chest pain prior to your cardiology appointment, please go to nearest emergency room.    Monitor blood pressure,  Goal is less than 120/80, based on newest guidelines; if persistently higher, please make sooner follow up appointment so we can recheck you blood pressure and manage medications   Return for physical exam - please come fasting so we can do labs    Heart Attack A heart attack (myocardial infarction, MI) causes damage to the heart that cannot be fixed. A heart attack often happens when a blood clot or other blockage cuts blood flow to the heart. When this happens, certain areas of the heart begin to die. This causes the pain you feel during a heart attack. Follow these instructions at home:  Take medicine as told by your doctor. You may need medicine to: ? Keep your blood from clotting too easily. ? Control your blood pressure. ? Lower your cholesterol. ? Control abnormal heart rhythms.  Change certain behaviors as told by your doctor. This may include: ? Quitting smoking. ? Being active. ? Eating a heart-healthy diet. Ask your doctor for help with this diet. ? Keeping a healthy weight. ? Keeping your diabetes under  control. ? Lessening stress. ? Limiting how much alcohol you drink. Do not take these medicines unless your doctor says that you can:  Nonsteroidal anti-inflammatory drugs (NSAIDs). These include: ? Ibuprofen. ? Naproxen. ? Celecoxib.  Vitamin supplements that have vitamin A, vitamin E, or both.  Hormone therapy that contains estrogen with or without progestin.  Get help right away if:  You have sudden chest discomfort.  You have sudden discomfort in your: ? Arms. ? Back. ? Neck. ? Jaw.  You have shortness of breath at any time.  You have sudden sweating or clammy skin.  You feel sick to your stomach (nauseous) or throw up (vomit).  You suddenly get light-headed or dizzy.  You feel your heart beating fast or skipping beats. These symptoms may be an emergency. Do not wait to see if the symptoms will go away. Get medical help right away. Call your local emergency services (911 in the [place] Do not drive yourself to the hospital. This information is not intended to replace advice given to you by your health care provider. Make sure you discuss any questions you have with your health care provider. Document Released: 09/09/2011 Document Revised: 08/16/2015 Document Reviewed: 05/13/2013 Elsevier Interactive Patient Education  2017 Elsevier Inc.    Generalized Anxiety Disorder, Adult Generalized anxiety disorder (GAD) is a mental health disorder. People with this condition constantly worry about everyday events. Unlike normal anxiety, worry related to GAD is not triggered by a specific event. These worries also do  not fade or get better with time. GAD interferes with life functions, including relationships, work, and school. GAD can vary from mild to severe. People with severe GAD can have intense waves of anxiety with physical symptoms (panic attacks). What are the causes? The exact cause of GAD is not known. What increases the risk? This condition is more likely to develop  in:  Women.  People who have a family history of anxiety disorders.  People who are very shy.  People who experience very stressful life events, such as the death of a loved one.  People who have a very stressful family environment.  What are the signs or symptoms? People with GAD often worry excessively about many things in their lives, such as their health and family. They may also be overly concerned about:  Doing well at work.  Being on time.  Natural disasters.  Friendships.  Physical symptoms of GAD include:  Fatigue.  Muscle tension or having muscle twitches.  Trembling or feeling shaky.  Being easily startled.  Feeling like your heart is pounding or racing.  Feeling out of breath or like you cannot take a deep breath.  Having trouble falling asleep or staying asleep.  Sweating.  Nausea, diarrhea, or irritable bowel syndrome (IBS).  Headaches.  Trouble concentrating or remembering facts.  Restlessness.  Irritability.  How is this diagnosed? Your health care provider can diagnose GAD based on your symptoms and medical history. You will also have a physical exam. The health care provider will ask specific questions about your symptoms, including how severe they are, when they started, and if they come and go. Your health care provider may ask you about your use of alcohol or drugs, including prescription medicines. Your health care provider may refer you to a mental health specialist for further evaluation. Your health care provider will do a thorough examination and may perform additional tests to rule out other possible causes of your symptoms. To be diagnosed with GAD, a person must have anxiety that:  Is out of his or her control.  Affects several different aspects of his or her life, such as work and relationships.  Causes distress that makes him or her unable to take part in normal activities.  Includes at least three physical symptoms of GAD,  such as restlessness, fatigue, trouble concentrating, irritability, muscle tension, or sleep problems.  Before your health care provider can confirm a diagnosis of GAD, these symptoms must be present more days than they are not, and they must last for six months or longer. How is this treated? The following therapies are usually used to treat GAD:  Medicine. Antidepressant medicine is usually prescribed for long-term daily control. Antianxiety medicines may be added in severe cases, especially when panic attacks occur.  Talk therapy (psychotherapy). Certain types of talk therapy can be helpful in treating GAD by providing support, education, and guidance. Options include: ? Cognitive behavioral therapy (CBT). People learn coping skills and techniques to ease their anxiety. They learn to identify unrealistic or negative thoughts and behaviors and to replace them with positive ones. ? Acceptance and commitment therapy (ACT). This treatment teaches people how to be mindful as a way to cope with unwanted thoughts and feelings. ? Biofeedback. This process trains you to manage your body's response (physiological response) through breathing techniques and relaxation methods. You will work with a therapist while machines are used to monitor your physical symptoms.  Stress management techniques. These include yoga, meditation, and exercise.  A  mental health specialist can help determine which treatment is best for you. Some people see improvement with one type of therapy. However, other people require a combination of therapies. Follow these instructions at home:  Take over-the-counter and prescription medicines only as told by your health care provider.  Try to maintain a normal routine.  Try to anticipate stressful situations and allow extra time to manage them.  Practice any stress management or self-calming techniques as taught by your health care provider.  Do not punish yourself for setbacks  or for not making progress.  Try to recognize your accomplishments, even if they are small.  Keep all follow-up visits as told by your health care provider. This is important. Contact a health care provider if:  Your symptoms do not get better.  Your symptoms get worse.  You have signs of depression, such as: ? A persistently sad, cranky, or irritable mood. ? Loss of enjoyment in activities that used to bring you joy. ? Change in weight or eating. ? Changes in sleeping habits. ? Avoiding friends or family members. ? Loss of energy for normal tasks. ? Feelings of guilt or worthlessness. Get help right away if:  You have serious thoughts about hurting yourself or others. If you ever feel like you may hurt yourself or others, or have thoughts about taking your own life, get help right away. You can go to your nearest emergency department or call:  Your local emergency services (911 in the [place]  A suicide crisis helpline, such as the [place] at 336-358-5124. This is open 24 hours a day.  Summary  Generalized anxiety disorder (GAD) is a mental health disorder that involves worry that is not triggered by a specific event.  People with GAD often worry excessively about many things in their lives, such as their health and family.  GAD may cause physical symptoms such as restlessness, trouble concentrating, sleep problems, frequent sweating, nausea, diarrhea, headaches, and trembling or muscle twitching.  A mental health specialist can help determine which treatment is best for you. Some people see improvement with one type of therapy. However, other people require a combination of therapies. This information is not intended to replace advice given to you by your health care provider. Make sure you discuss any questions you have with your health care provider. Document Released: 07/05/2012 Document Revised: 01/29/2016 Document Reviewed:  01/29/2016 Elsevier Interactive Patient Education  [employer].

## 2018-02-08 NOTE — Assessment & Plan Note (Addendum)
No pain today. Suspect anxiety contributory.  EKG shows abnormal rate. NO acute ischemia. No significant changes when compared to prior EKG, 2016. Pending labs in 5 days . Advised if recurs to go to ER until she can be seen by cardiology, patient verbalized understanding of the importance of this. Literature on MI given to patient as well.

## 2018-02-08 NOTE — Assessment & Plan Note (Addendum)
Suspect GAD and panic attacks. Trial of zoloft. Close follow up and patient will return for CPE and labs in 6 weeks.

## 2018-02-08 NOTE — Assessment & Plan Note (Signed)
No hernia appreciated. Benign exam. Pending [place], labs. Close follow up.

## 2018-02-08 NOTE — Assessment & Plan Note (Signed)
Elevated. Start lisinopril. She will monitor BP at home. Close follow up

## 2018-02-08 NOTE — Progress Notes (Signed)
Subjective:    Patient ID: Nicole Escobar, female    DOB: 04-14-1975, 42 y.o.   MRN: [id number]  CC: Nicole Escobar is a 42 y.o. female who presents today for an acute visit.    HPI: Multiple complaints.   Red spot on left side of abdomen x 1-2 months, unchanged.  Drained yellow pus. Slightly tender Applied neosporin with some relief. Itching now. No h/o mrsa. H/o boils.   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   Abdominal hernia x 7 years when son was born.  Feels increased in size, feels a 'pressure'.  Notices it when she coughs, sneezes. No abdominal pain, constipation, urinary incontinence, dysparenia   Never been on blood pressure medication. Doesn't check blood pressure.    Feels like very anxious. Every one in a while has 'pain in chest' which lasts for a couple of seconds. No SOB, left arm pain. Occurs when moving around. Occurred 3-4 times in last month. Hasn't lifted anything heavy. Feels like anxiety. No epigastric burning, belching. Never relaxed. No depression. Sleeps well. 'I love life.' 74 year son. Having anxiety attacks and heart starts to beat fast. Deep breathing helps. No si/hi.  Drink 2 cups of coffee per day.  Former smoker.  No drug use. Sober 10-12 years.      HISTORY:  Past Medical History:  Diagnosis Date  . Emphysema of lung ([place])   . Frequent headaches    unknown onset   Past Surgical History:  Procedure Laterality Date  . TONSILECTOMY/ADENOIDECTOMY WITH MYRINGOTOMY Bilateral   . TUBAL LIGATION  2013   Family History  Problem Relation Age of Onset  . Alcohol abuse Mother   . Arthritis Mother   . Cancer Mother        ovary  . Hyperlipidemia Mother   . Hypertension Mother   . Diabetes Mother   . Arthritis Maternal Grandmother   . Hypertension Maternal Grandmother   . Cancer Maternal Grandmother        colon    Allergies: Sulfa antibiotics Current Outpatient Medications on File Prior to Visit  Medication Sig Dispense Refill  . [DISCONTINUED]  albuterol (PROVENTIL HFA;VENTOLIN HFA) 108 (90 Base) MCG/ACT inhaler Inhale 2 puffs into the lungs every 6 (six) hours as needed for wheezing or shortness of breath. 1 Inhaler 0   No current facility-administered medications on file prior to visit.     Social History   Tobacco Use  . Smoking status: Former Smoker    Last attempt to quit: 03/25/2003    Years since quitting: 14.8  . Smokeless tobacco: Never Used  Substance Use Topics  . Alcohol use: No    Alcohol/week: 0.0 standard drinks  . 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     Review of Systems  Constitutional: Negative for chills and fever.  Respiratory: Negative for cough and shortness of breath.   Cardiovascular: Positive for chest pain and palpitations.  Gastrointestinal: Positive for abdominal pain. Negative for abdominal distention, constipation, diarrhea, nausea and vomiting.  Skin: Positive for wound.  Psychiatric/Behavioral: Negative for sleep disturbance and suicidal ideas. The patient is nervous/anxious.       Objective:    BP 130/86 (BP Location: Left Arm, Patient Position: Sitting, Cuff Size: Large)   Pulse 76   Temp 98 F (36.7 C) (Oral)   Resp 15   Ht 5\' 4"  (1.626 m)  Wt 213 lb 6 oz (96.8 kg)   SpO2 98%   BMI 36.63 kg/m  BP Readings from Last 3 Encounters:  02/08/18 130/86  11/18/16 (!) 130/94  07/01/16 126/60     Physical Exam  Constitutional: She appears well-developed and well-nourished.  Eyes: Conjunctivae are normal.  Cardiovascular: Normal rate, regular rhythm, normal heart sounds and normal pulses.  Pulmonary/Chest: Effort normal and breath sounds normal. She has no wheezes. She has no rhonchi. She has no rales. She exhibits no tenderness.  NO cp on exam  Abdominal: Soft. Normal appearance and bowel sounds are normal. She exhibits no distension, no fluid wave, no ascites and no mass. There is no tenderness. There is no  rigidity, no rebound, no guarding and no CVA tenderness.    Mid - lower abdomen were patient notes 'pressure.' No tenderness elicited on exam. No bulge seen with coughing.  Neurological: She is alert.  Skin: Skin is warm and dry.     Slightly tender papule noted as on diagram. No purulent discharge, increased warmth. Non fluctuant.   Psychiatric: She has a normal mood and affect. Her speech is normal and behavior is normal. Thought content normal.  Vitals reviewed.      Assessment & Plan:   Problem List Items Addressed This Visit      Musculoskeletal and Integument   Boil    Start keflex. She will let me know if not better        Other   GAD (generalized anxiety disorder) - Primary    Suspect GAD and panic attacks. Trial of zoloft. Close follow up and patient will return for CPE and labs in 6 weeks.         Relevant Medications   sertraline (ZOLOFT) 50 MG tablet   Other Relevant Orders   EKG 12-Lead (Completed)   Lower abdominal pain    No hernia appreciated. Benign exam. Pending [place], labs. Close follow up.       Relevant Orders   US Abdomen Complete   Elevated blood pressure reading    Elevated. Start lisinopril. She will monitor BP at home. Close follow up      Relevant Medications   lisinopril (PRINIVIL,ZESTRIL) 5 MG tablet   Other Relevant Orders   CBC with Differential/Platelet   Comprehensive metabolic panel   Hemoglobin A1c   Hepatitis C antibody   HIV Antibody (routine testing w rflx)   Lipid panel   TSH   VITAMIN D 25 Hydroxy (Vit-D Deficiency, Fractures)   Chest pain    No pain today. Suspect anxiety contributory.  EKG shows abnormal rate. NO acute ischemia. No significant changes when compared to prior EKG, 2016. Pending labs in 5 days . Advised if recurs to go to ER until she can be seen by cardiology, patient verbalized understanding of the importance of this. Literature on MI given to patient as well.       Relevant Orders   Ambulatory  referral to Cardiology        I am having Dewayne Hatch. [place] start on sertraline and lisinopril.   Meds ordered this encounter  Medications  . DISCONTD: cephALEXin (KEFLEX) 500 MG capsule    Sig: Take 1 capsule (500 mg total) by mouth every 6 (six) hours.    Dispense:  28 capsule    Refill:  0    Order Specific Question:   Supervising Provider    Answer:   Deborra Medina L [[id number]]  . sertraline (ZOLOFT) 50 MG tablet  Sig: Take 1 tablet (50 mg total) by mouth at bedtime.    Dispense:  90 tablet    Refill:  3    Order Specific Question:   Supervising Provider    Answer:   Deborra Medina L [[id number]]  . lisinopril (PRINIVIL,ZESTRIL) 5 MG tablet    Sig: Take 1 tablet (5 mg total) by mouth daily.    Dispense:  90 tablet    Refill:  3    Order Specific Question:   Supervising Provider    Answer:   Crecencio Mc [[id number]]    Return precautions given.   Risks, benefits, and alternatives of the medications and treatment plan prescribed today were discussed, and patient expressed understanding.   Education regarding symptom management and diagnosis given to patient on AVS.  Continue to follow with Burnard Hawthorne, FNP for routine health maintenance.   Albertha Ghee and I agreed with plan.   Mable Paris, FNP

## 2018-02-08 NOTE — Assessment & Plan Note (Signed)
Start keflex. She will let me know if not better

## 2018-02-16 ENCOUNTER — Other Ambulatory Visit: Payer: BLUE CROSS/BLUE SHIELD

## 2018-02-17 ENCOUNTER — Ambulatory Visit (INDEPENDENT_AMBULATORY_CARE_PROVIDER_SITE_OTHER): Payer: BLUE CROSS/BLUE SHIELD | Admitting: Family

## 2018-02-17 ENCOUNTER — Encounter: Payer: Self-pay | Admitting: Family

## 2018-02-17 VITALS — BP 132/88 | HR 85 | Wt 210.8 lb

## 2018-02-17 DIAGNOSIS — F411 Generalized anxiety disorder: Secondary | ICD-10-CM | POA: Diagnosis not present

## 2018-02-17 DIAGNOSIS — R030 Elevated blood-pressure reading, without diagnosis of hypertension: Secondary | ICD-10-CM | POA: Diagnosis not present

## 2018-02-17 DIAGNOSIS — L0292 Furuncle, unspecified: Secondary | ICD-10-CM | POA: Diagnosis not present

## 2018-02-17 MED ORDER — MUPIROCIN 2 % EX OINT: 1.0000 "application " | TOPICAL_OINTMENT | Freq: Two times a day (BID) | CUTANEOUS | 0 refills | Status: DC

## 2018-02-17 MED ORDER — CEPHALEXIN 500 MG PO CAPS: 500.0000 mg | ORAL_CAPSULE | Freq: Four times a day (QID) | ORAL | 0 refills | Status: DC

## 2018-02-17 NOTE — Progress Notes (Signed)
Subjective:    Patient ID: Nicole Escobar, female    DOB: September 18, 1975, 42 y.o.   MRN: [id number]  CC: Nicole Escobar is a 42 y.o. female who presents today for follow up.   HPI: Boil has improved however slightly tender. No fever.   HTN- not taking lisinopril as thinks blood pressure is better at home. Not checking at home.  Awaiting Cardiology appt. NO cp, sob, left arm pain.   GAD- stopped zoloft as made legs 'feel funny'.  'may have been my anxiety.' Took for 7 days. Took it in the morning. Took paxil with similar side effects. Struggles with taking medication and doesn't want to be on medication. No si/hi.    [place] of hernia scheduled.        HISTORY:  Past Medical History:  Diagnosis Date  . Emphysema of lung ([place])   . Frequent headaches    unknown onset   Past Surgical History:  Procedure Laterality Date  . TONSILECTOMY/ADENOIDECTOMY WITH MYRINGOTOMY Bilateral   . TUBAL LIGATION  2013   Family History  Problem Relation Age of Onset  . Alcohol abuse Mother   . Arthritis Mother   . Cancer Mother        ovary  . Hyperlipidemia Mother   . Hypertension Mother   . Diabetes Mother   . Arthritis Maternal Grandmother   . Hypertension Maternal Grandmother   . Cancer Maternal Grandmother        colon    Allergies: Sulfa antibiotics Current Outpatient Medications on File Prior to Visit  Medication Sig Dispense Refill  . lisinopril (PRINIVIL,ZESTRIL) 5 MG tablet Take 1 tablet (5 mg total) by mouth daily. (Patient not taking: Reported on 02/17/2018) 90 tablet 3  . [DISCONTINUED] albuterol (PROVENTIL HFA;VENTOLIN HFA) 108 (90 Base) MCG/ACT inhaler Inhale 2 puffs into the lungs every 6 (six) hours as needed for wheezing or shortness of breath. 1 Inhaler 0   No current facility-administered medications on file prior to visit.     Social History   Tobacco Use  . Smoking status: Former Smoker    Last attempt to quit: 03/25/2003    Years since quitting: 14.9  . Smokeless  tobacco: Never Used  Substance Use Topics  . Alcohol use: No    Alcohol/week: 0.0 standard drinks  . 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     Review of Systems  Constitutional: Negative for chills and fever.  Respiratory: Negative for cough.   Cardiovascular: Negative for chest pain (no recurrent episodes) and palpitations.  Gastrointestinal: Negative for nausea and vomiting.  Skin: Positive for wound (improving).  Psychiatric/Behavioral: Positive for sleep disturbance. Negative for suicidal ideas. The patient is nervous/anxious.       Objective:    BP 132/88 (BP Location: Left Arm, Patient Position: Sitting, Cuff Size: Large)   Pulse 85   Wt 210 lb 12.8 oz (95.6 kg)   SpO2 97%   BMI 36.18 kg/m  BP Readings from Last 3 Encounters:  02/17/18 132/88  02/08/18 130/86  11/18/16 (!) 130/94   Wt Readings from Last 3 Encounters:  02/17/18 210 lb 12.8 oz (95.6 kg)  02/08/18 213 lb 6 oz (96.8 kg)  11/18/16 209 lb 6.4 oz (95 kg)    Physical Exam  Constitutional: She appears well-developed and well-nourished.  Eyes: Conjunctivae are normal.  Cardiovascular: Normal rate, regular rhythm, normal heart sounds and  normal pulses.  Pulmonary/Chest: Effort normal and breath sounds normal. She has no wheezes. She has no rhonchi. She has no rales.  Neurological: She is alert.  Skin: Skin is warm and dry. There is erythema.     Focal area of erythema, slight tenderness with palpation.  No purulent discharge, increased warmth. Lesion approximately 2 cm in width.  Nonfluctuant.  Psychiatric: She has a normal mood and affect. Her speech is normal and behavior is normal. Thought content normal.  Vitals reviewed.      Assessment & Plan:   Problem List Items Addressed This Visit      Musculoskeletal and Integument   Boil - Primary    Improved, however not resolved.  Another week of Keflex given.  Bactroban given as well.   Patient will  let me know if not resolved.      Relevant Medications   mupirocin ointment (BACTROBAN) 2 %   cephALEXin (KEFLEX) 500 MG capsule     Other   GAD (generalized anxiety disorder)    Unchanged.  She will trial zoloft again as concerned suspected side effects may have been from anxiety. Close f/u one month.       Elevated blood pressure reading    Continues to be slightly elevated based on newest blood pressure guidelines.  No CP. Emphasized the importance of patient to monitor blood pressure at home as she thinks it is only elevate the office.  She will do this at home and bring blood pressure readings to next office visit.  We will decide at that point if we should start lisinopril.  Messaged to [facility] in regards to cardiology referral, reemphasized the importance of staying vigilant and in regards to any chest pain, patient understands to go to nearest emergency room.          I have discontinued Dewayne Hatch. [place]'s sertraline. I am also having her start on mupirocin ointment. Additionally, I am having her maintain her lisinopril and cephALEXin.   Meds ordered this encounter  Medications  . mupirocin ointment (BACTROBAN) 2 %    Sig: Place 1 application into the nose 2 (two) times daily.    Dispense:  22 g    Refill:  0    Order Specific Question:   Supervising Provider    Answer:   Derrel Nip, TERESA L [[id number]]  . cephALEXin (KEFLEX) 500 MG capsule    Sig: Take 1 capsule (500 mg total) by mouth every 6 (six) hours.    Dispense:  28 capsule    Refill:  0    Order Specific Question:   Supervising Provider    Answer:   Crecencio Mc [[id number]]    Return precautions given.   Risks, benefits, and alternatives of the medications and treatment plan prescribed today were discussed, and patient expressed understanding.   Education regarding symptom management and diagnosis given to patient on AVS.  Continue to follow with Burnard Hawthorne, FNP for routine health maintenance.    Albertha Ghee and I agreed with plan.   Mable Paris, FNP

## 2018-02-17 NOTE — Assessment & Plan Note (Signed)
Improved, however not resolved.  Another week of Keflex given.  Bactroban given as well.  Patient will  let me know if not resolved.

## 2018-02-17 NOTE — Assessment & Plan Note (Signed)
Continues to be slightly elevated based on newest blood pressure guidelines.  No CP. Emphasized the importance of patient to monitor blood pressure at home as she thinks it is only elevate the office.  She will do this at home and bring blood pressure readings to next office visit.  We will decide at that point if we should start lisinopril.  Messaged to [facility] in regards to cardiology referral, reemphasized the importance of staying vigilant and in regards to any chest pain, patient understands to go to nearest emergency room.

## 2018-02-17 NOTE — Patient Instructions (Addendum)
Re try zoloft- take 2 hours prior to going to sleep.  Monitor blood pressure at home; you can hold lisnipril for now and we can see if readings look.   Stay vigilant regarding symptoms particular chest pain , pressure - if you have this, please go to emergency room   Let me know if boil doesn't resolve

## 2018-02-17 NOTE — Assessment & Plan Note (Addendum)
Unchanged.  She will trial zoloft again as concerned suspected side effects may have been from anxiety. Close f/u one month.

## 2018-02-22 ENCOUNTER — Telehealth: Payer: Self-pay | Admitting: Family

## 2018-02-22 NOTE — Telephone Encounter (Signed)
Cardiology tried to call pt 1x already to schedule. Please advise? Thank you!

## 2018-02-22 NOTE — Telephone Encounter (Signed)
FYI

## 2018-02-24 ENCOUNTER — Telehealth: Payer: Self-pay | Admitting: Family

## 2018-02-24 ENCOUNTER — Ambulatory Visit: Payer: BLUE CROSS/BLUE SHIELD | Attending: Family

## 2018-02-24 NOTE — Telephone Encounter (Signed)
Just a FYI

## 2018-02-24 NOTE — Telephone Encounter (Signed)
Copied from [place] [phone]. Topic: Quick Communication - See Telephone Encounter >> Feb 24, 2018 11:09 AM Rutherford Nail, NT wrote: CRM for notification. See Telephone encounter for: 02/24/18. Almyra Free with the ultrasound department at [facility] calling and states that the patient did not show up for her abdominal ultrasound. CB#: [phone]

## 2018-02-28 NOTE — Telephone Encounter (Signed)
Call pt Nicole Escobar sent her a message to regarding cardiology however doesn't appear she has read If you cannot reach her, we need to mail a letter

## 2018-03-01 NOTE — Telephone Encounter (Signed)
Letter mailed to patient.

## 2018-03-01 NOTE — Telephone Encounter (Signed)
I have tried to call patient on available numbers, but unable to reach.

## 2018-03-22 ENCOUNTER — Encounter: Payer: BLUE CROSS/BLUE SHIELD | Admitting: Family

## 2018-03-22 DIAGNOSIS — Z0289 Encounter for other administrative examinations: Secondary | ICD-10-CM

## 2018-05-11 ENCOUNTER — Ambulatory Visit (INDEPENDENT_AMBULATORY_CARE_PROVIDER_SITE_OTHER): Payer: BLUE CROSS/BLUE SHIELD | Admitting: Family Medicine

## 2018-05-11 ENCOUNTER — Encounter: Payer: Self-pay | Admitting: Family Medicine

## 2018-05-11 VITALS — BP 148/90 | HR 97 | Temp 98.0°F | Resp 18 | Ht 64.0 in | Wt 211.8 lb

## 2018-05-11 DIAGNOSIS — M545 Low back pain, unspecified: Secondary | ICD-10-CM

## 2018-05-11 MED ORDER — CYCLOBENZAPRINE HCL 5 MG PO TABS: 5.0000 mg | ORAL_TABLET | Freq: Three times a day (TID) | ORAL | 0 refills | Status: DC | PRN

## 2018-05-11 MED ORDER — METHYLPREDNISOLONE 4 MG PO TBPK: ORAL_TABLET | ORAL | 0 refills | Status: DC

## 2018-05-11 MED ORDER — KETOROLAC TROMETHAMINE 60 MG/2ML IM SOLN
60.0000 mg | Freq: Once | INTRAMUSCULAR | Status: AC
  Administered 2018-05-11: 60 mg via INTRAMUSCULAR

## 2018-05-11 MED ORDER — METHYLPREDNISOLONE ACETATE 40 MG/ML IJ SUSP
40.0000 mg | Freq: Once | INTRAMUSCULAR | Status: AC
  Administered 2018-05-11: 40 mg via INTRAMUSCULAR

## 2018-05-11 NOTE — Progress Notes (Signed)
Subjective:    Patient ID: Nicole Escobar, female    DOB: [birth year], 43 y.o.   MRN: [id number]  HPI   Patient presents to clinic complaining of right-sided low back pain.  It began about 3 days ago after using a long scrub brush to put soap all over her car at the car wash.  States that she leaned forward to scrub her car, and upon pulling back to a standing motion, she felt a pulling pain in right low back.  Denies lower extremity numbness or weakness.  Denies saddle anesthesia.  Denies loss of bowel or bladder control.  Patient Active Problem List   Diagnosis Date Noted  . GAD (generalized anxiety disorder) 02/08/2018  . Lower abdominal pain 02/08/2018  . Boil 02/08/2018  . Elevated blood pressure reading 02/08/2018  . Chest pain 02/08/2018  . Left ear pain 11/18/2016  . History of cocaine abuse ([place]) 04/03/2016  . Influenza-like illness 05/14/2015  . Benign paroxysmal positional vertigo 10/17/2014  . Hand joint pain 05/16/2014  . Bilateral carpal tunnel syndrome 05/16/2014  . Stress headaches 03/16/2014   Social History   Tobacco Use  . Smoking status: Former Smoker    Last attempt to quit: 03/25/2003    Years since quitting: 15.1  . Smokeless tobacco: Never Used  Substance Use Topics  . Alcohol use: No    Alcohol/week: 0.0 standard drinks   Review of Systems  Constitutional: Negative for chills, fatigue and fever.  HENT: Negative for congestion, ear pain, sinus pain and sore throat.   Eyes: Negative.   Respiratory: Negative for cough, shortness of breath and wheezing.   Cardiovascular: Negative for chest pain, palpitations and leg swelling.  Gastrointestinal: Negative for abdominal pain, diarrhea, nausea and vomiting.  Genitourinary: Negative for dysuria, frequency and urgency.  Musculoskeletal: +right sided low back pain  Skin: Negative for color change, pallor and rash.  Neurological: Negative for syncope, light-headedness and headaches.  Psychiatric/Behavioral:  The patient is not nervous/anxious.       Objective:   Physical Exam Vitals signs and nursing note reviewed.  Constitutional:      Appearance: She is well-developed.  HENT:     Head: Normocephalic and atraumatic.  Eyes:     General: No scleral icterus.    Extraocular Movements: Extraocular movements intact.     Conjunctiva/sclera: Conjunctivae normal.  Neck:     Musculoskeletal: Normal range of motion and neck supple. No neck rigidity.     Trachea: No tracheal deviation.  Cardiovascular:     Rate and Rhythm: Normal rate and regular rhythm.  Pulmonary:     Effort: Pulmonary effort is normal. No respiratory distress.     Breath sounds: Normal breath sounds.  Musculoskeletal:     Lumbar back: She exhibits tenderness.       Back:     Comments: Right sided low back tenderness location indicated by red mark on diagram.  Patient able to bend forward and backward, lean side to side and twist side to side, pain when twisting and bending to the right side.  Pulling pain when right leg raised.   Skin:    General: Skin is warm and dry.     Capillary Refill: Capillary refill takes less than 2 seconds.     Coloration: Skin is not jaundiced or pale.     Findings: No erythema.  Neurological:     Mental Status: She is alert and oriented to person, place, and time.     Gait:  Gait normal.  Psychiatric:        Mood and Affect: Mood normal.        Behavior: Behavior normal.        Thought Content: Thought content normal.     Vitals:   05/11/18 1136  BP: (!) 148/90  Pulse: 97  Resp: 18  Temp: 98 F (36.7 C)  SpO2: 96%      Assessment & Plan:   Acute right-sided low back pain-suspect patient has a acute right-sided low back strain related to washing her car.  She will get IM Toradol and steroid in clinic.  She will take oral steroid taper and muscle relaxer as needed at home.  Given low back stretching exercises to do at least 1-2 times a day.  Also advised she can use topical rub like  Biofreeze or BenGay on sore area and a heating pad to help soothe pain.  Out of work note given to allow patient time to rest and recover.  Administrations This Visit    ketorolac (TORADOL) injection 60 mg    Admin Date 05/11/2018 Action Given Dose 60 mg Route Intramuscular Administered By Neta Ehlers, RMA       methylPREDNISolone acetate (DEPO-MEDROL) injection 40 mg    Admin Date 05/11/2018 Action Given Dose 40 mg Route Intramuscular Administered By Neta Ehlers, RMA          Patient will return to clinic as already planned with her PCP.  She will follow-up sooner if any issues arise or current symptoms persist or worsen.

## 2018-05-11 NOTE — Patient Instructions (Signed)

## 2018-12-09 ENCOUNTER — Ambulatory Visit: Payer: BLUE CROSS/BLUE SHIELD

## 2018-12-28 ENCOUNTER — Other Ambulatory Visit: Payer: Self-pay

## 2018-12-28 ENCOUNTER — Ambulatory Visit (INDEPENDENT_AMBULATORY_CARE_PROVIDER_SITE_OTHER): Payer: BC Managed Care – PPO

## 2018-12-28 DIAGNOSIS — Z23 Encounter for immunization: Secondary | ICD-10-CM

## 2018-12-31 ENCOUNTER — Emergency Department
Admission: EM | Admit: 2018-12-31 | Discharge: 2018-12-31 | Disposition: A | Discharge status: Home / Self Care | Payer: BC Managed Care – PPO | Attending: Student | Admitting: Student

## 2018-12-31 ENCOUNTER — Encounter: Payer: Self-pay | Admitting: Emergency Medicine

## 2018-12-31 ENCOUNTER — Emergency Department: Payer: BC Managed Care – PPO

## 2018-12-31 ENCOUNTER — Telehealth: Payer: Self-pay

## 2018-12-31 ENCOUNTER — Other Ambulatory Visit: Payer: Self-pay

## 2018-12-31 DIAGNOSIS — M79604 Pain in right leg: Secondary | ICD-10-CM

## 2018-12-31 DIAGNOSIS — Z79899 Other long term (current) drug therapy: Secondary | ICD-10-CM | POA: Diagnosis not present

## 2018-12-31 DIAGNOSIS — R2242 Localized swelling, mass and lump, left lower limb: Secondary | ICD-10-CM | POA: Diagnosis not present

## 2018-12-31 DIAGNOSIS — Z87891 Personal history of nicotine dependence: Secondary | ICD-10-CM | POA: Diagnosis not present

## 2018-12-31 DIAGNOSIS — L03115 Cellulitis of right lower limb: Secondary | ICD-10-CM | POA: Insufficient documentation

## 2018-12-31 DIAGNOSIS — L0292 Furuncle, unspecified: Secondary | ICD-10-CM

## 2018-12-31 DIAGNOSIS — R600 Localized edema: Secondary | ICD-10-CM | POA: Diagnosis not present

## 2018-12-31 IMAGING — US US EXTREM LOW VENOUS*R*
1 series · 13 of 24 positions shown · non-contrast
Comparison: None.

CLINICAL DATA: Right lower extremity pain and edema pain. Family
history of blood clots. Evaluate for DVT.



[Series 1: us extrem low venous*right* · 13 of 35 slices shown]
[im 1/35]
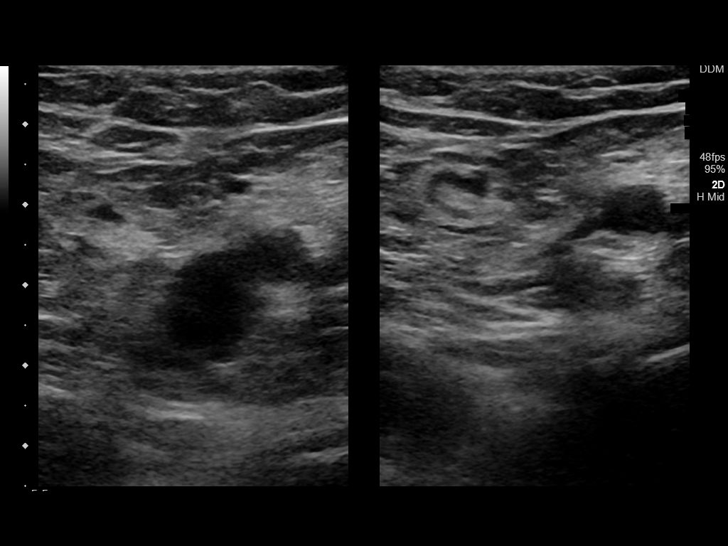
[im 3/35]
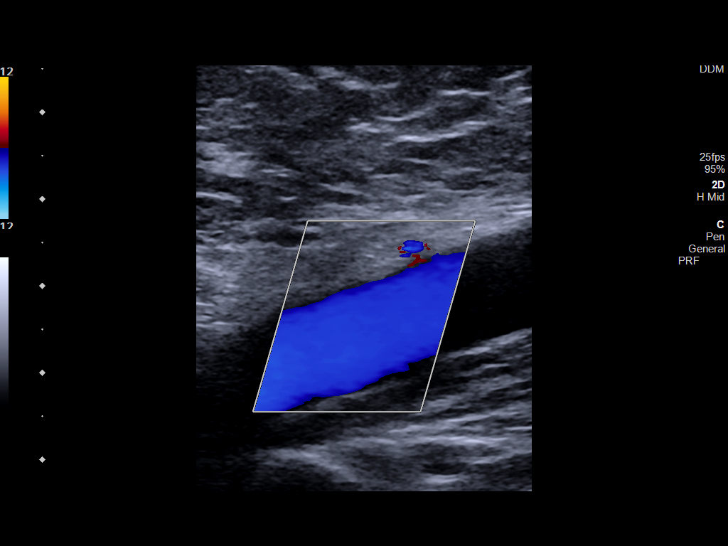
[im 6/35]
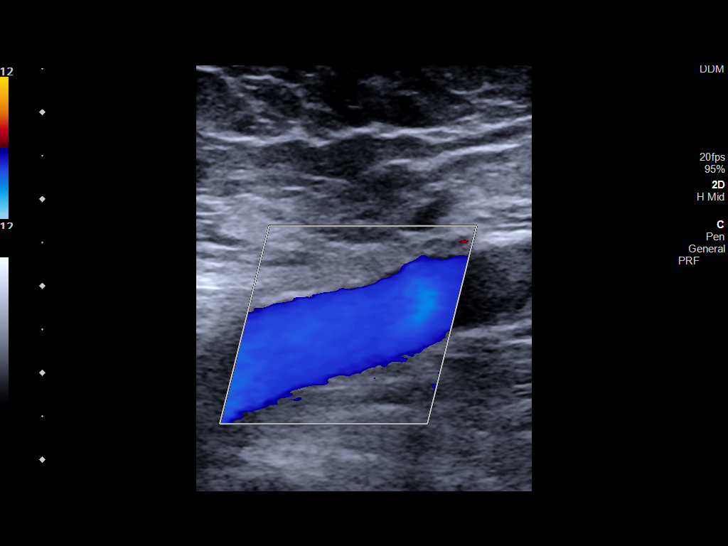
[im 9/35]
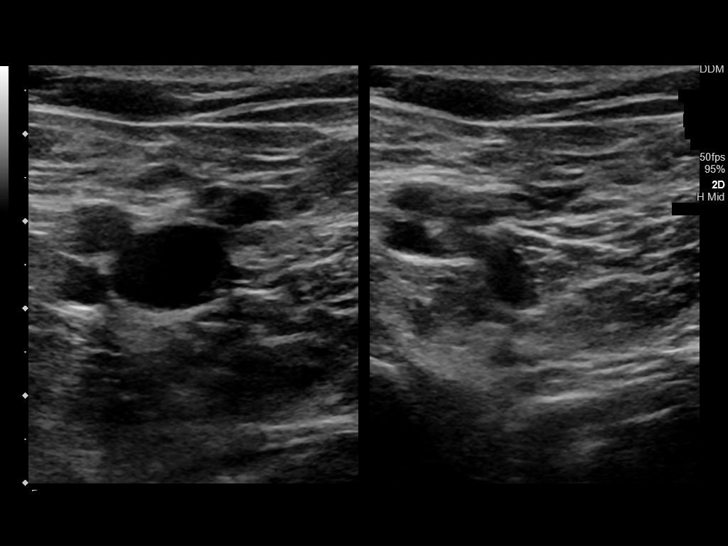
[im 12/35]
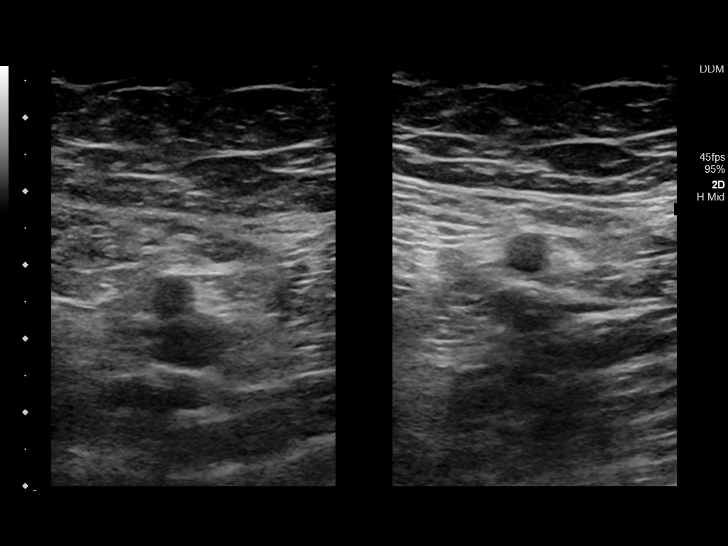
[im 15/35]
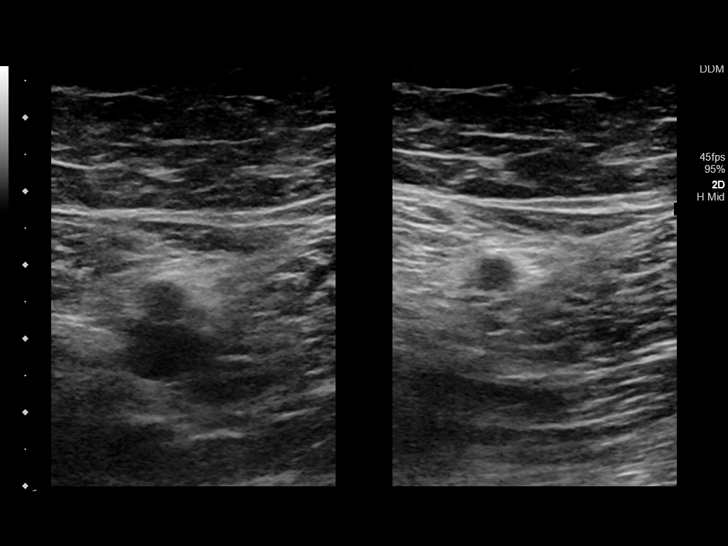
[im 18/35]
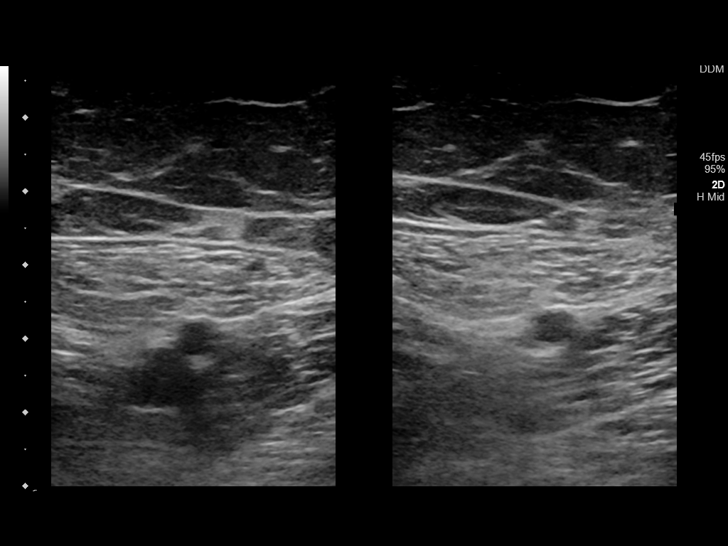
[im 20/35]
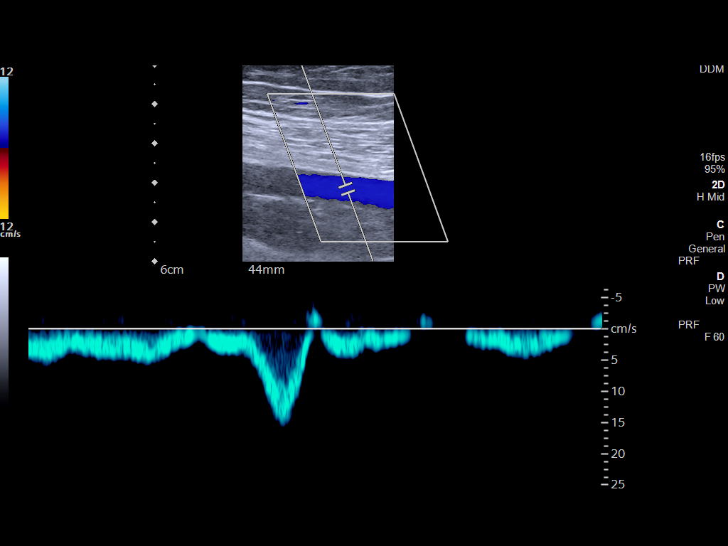
[im 23/35]
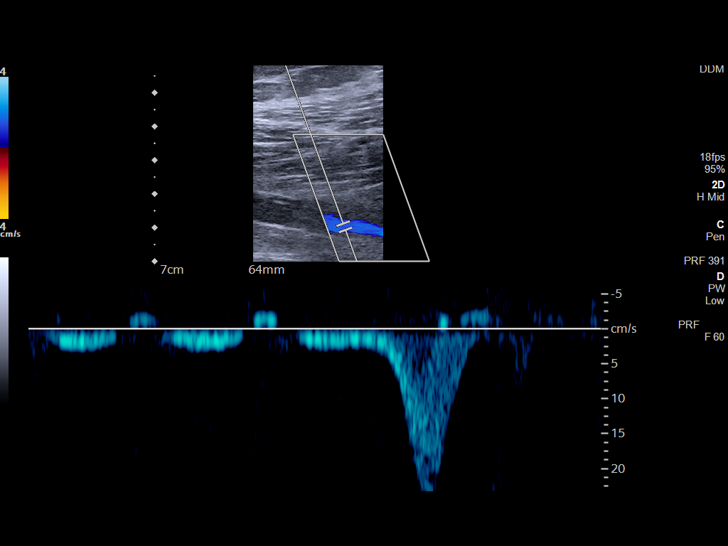
[im 26/35]
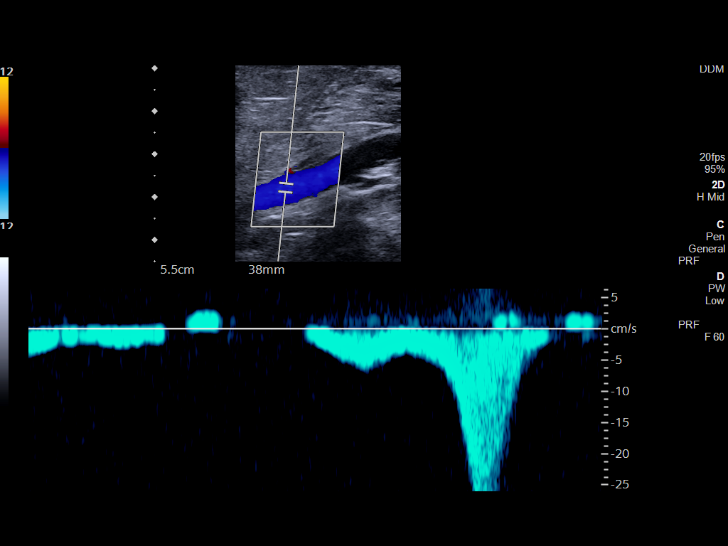
[im 29/35]
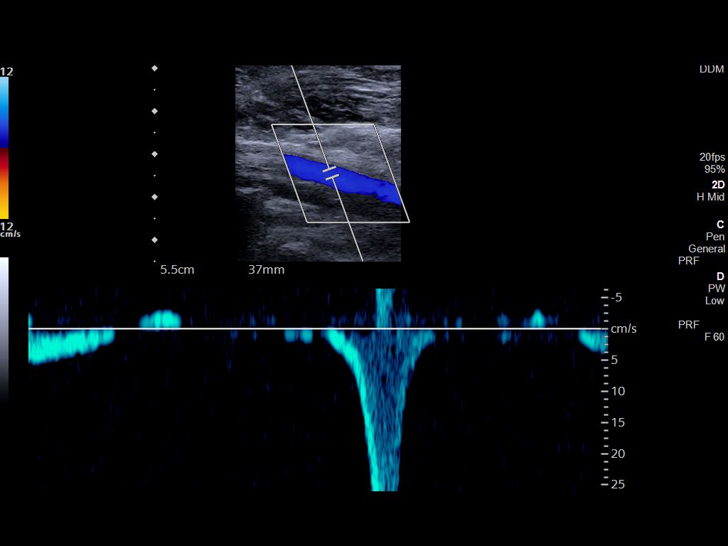
[im 32/35]
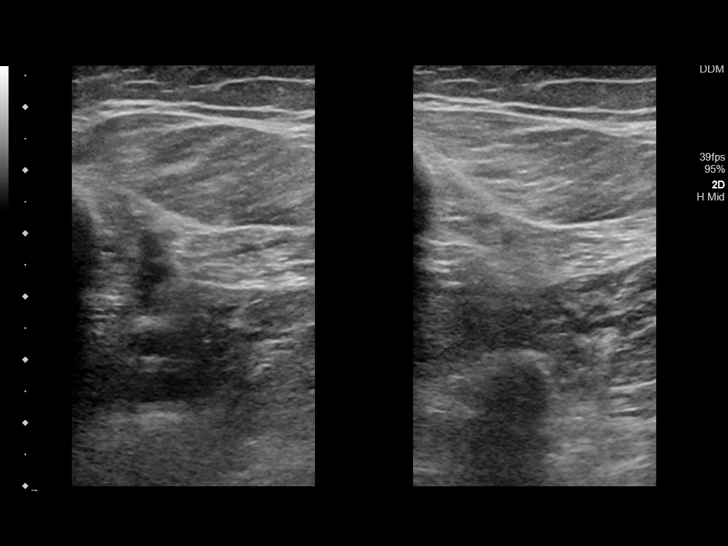
[im 35/35]
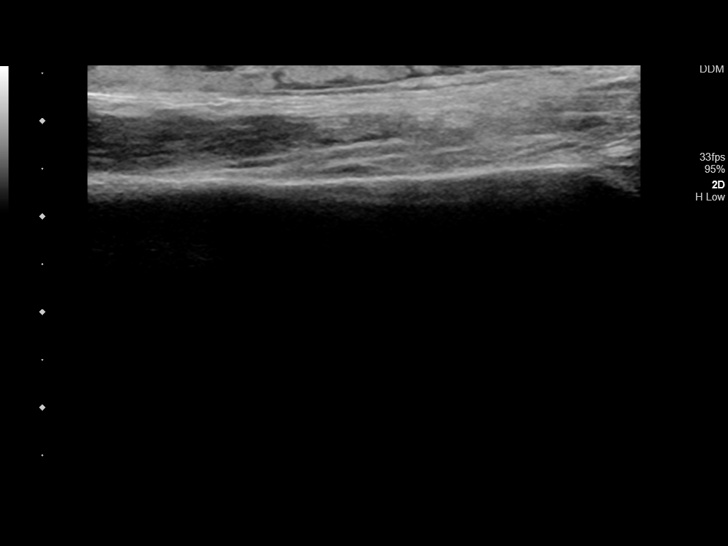

[13 of 24 positions shown; findings below may reference images not displayed]

FINDINGS: Contralateral Common Femoral Vein: Respiratory phasicity is normal
and symmetric with the symptomatic side. No evidence of thrombus.
Normal compressibility.

Common Femoral Vein: No evidence of thrombus. Normal
compressibility, respiratory phasicity and response to augmentation.

Saphenofemoral Junction: No evidence of thrombus. Normal
compressibility and flow on color Doppler imaging.

Profunda Femoral Vein: No evidence of thrombus. Normal
compressibility and flow on color Doppler imaging.

Femoral Vein: No evidence of thrombus. Normal compressibility,
respiratory phasicity and response to augmentation.

Popliteal Vein: No evidence of thrombus. Normal compressibility,
respiratory phasicity and response to augmentation.

Calf Veins: No evidence of thrombus. Normal compressibility and flow
on color Doppler imaging.

Superficial Great Saphenous Vein: No evidence of thrombus. Normal
compressibility.

Venous Reflux:  None.

Other Findings: There is a minimal amount of subcutaneous edema
correlating with the patient's area of pain and swelling involving
the dorsal aspect of the foot (images 35 and 36). No
definable/drainable fluid collections.
IMPRESSION: 1. No evidence of DVT within the right lower extremity.
2. Minimal amount of subcutaneous edema correlates with the
patient's area of pain and swelling involving the dorsal aspect of
the foot without definable/drainable fluid collection.

## 2018-12-31 MED ORDER — CEPHALEXIN 500 MG PO CAPS: 500.0000 mg | ORAL_CAPSULE | Freq: Three times a day (TID) | ORAL | 0 refills | Status: DC

## 2018-12-31 NOTE — ED Provider Notes (Signed)
[facility] Emergency Department Provider Note  ____________________________________________   First MD Initiated Contact with Patient 12/31/18 1053     (approximate)  I have reviewed the triage vital signs and the nursing notes.   HISTORY  Chief Complaint Leg Pain    HPI Nicole Escobar is a 43 y.o. female presents emergency department complaining of right leg swelling and pain.  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  However she is worried she is been off for the last 4 days and said a lot of swelling in the right lower leg.  Family history of blood clots.  Currently is not a smoker but was a heavy smoker for many years and quit 7 years again.  No hormone replacement.    Past Medical History:  Diagnosis Date  . Emphysema of lung ([place])   . Frequent headaches    unknown onset    Patient Active Problem List   Diagnosis Date Noted  . GAD (generalized anxiety disorder) 02/08/2018  . Lower abdominal pain 02/08/2018  . Boil 02/08/2018  . Elevated blood pressure reading 02/08/2018  . Chest pain 02/08/2018  . Left ear pain 11/18/2016  . History of cocaine abuse ([place]) 04/03/2016  . Influenza-like illness 05/14/2015  . Benign paroxysmal positional vertigo 10/17/2014  . Hand joint pain 05/16/2014  . Bilateral carpal tunnel syndrome 05/16/2014  . Stress headaches 03/16/2014    Past Surgical History:  Procedure Laterality Date  . TONSILECTOMY/ADENOIDECTOMY WITH MYRINGOTOMY Bilateral   . TUBAL LIGATION  2013    Prior to Admission medications   Medication Sig Start Date End Date Taking? Authorizing Provider  cephALEXin (KEFLEX) 500 MG capsule Take 1 capsule (500 mg total) by mouth 3 (three) times daily. 12/31/18   Fisher, Linden Dolin, PA-C  cyclobenzaprine (FLEXERIL) 5 MG tablet Take 1 tablet (5 mg total) by mouth 3 (three) times daily as needed for muscle spasms. 05/11/18   Guse, Jacquelynn Cree, FNP  lisinopril  (PRINIVIL,ZESTRIL) 5 MG tablet Take 1 tablet (5 mg total) by mouth daily. 02/08/18   Burnard Hawthorne, FNP  methylPREDNISolone (MEDROL DOSEPAK) 4 MG TBPK tablet Take according to pack instructions 05/11/18   Jodelle Green, FNP  mupirocin ointment (BACTROBAN) 2 % Place 1 application into the nose 2 (two) times daily. 02/17/18   Burnard Hawthorne, FNP  albuterol (PROVENTIL HFA;VENTOLIN HFA) 108 (90 Base) MCG/ACT inhaler Inhale 2 puffs into the lungs every 6 (six) hours as needed for wheezing or shortness of breath. 05/17/15 06/12/15  Rubbie Battiest, RN    Allergies Sulfa antibiotics  Family History  Problem Relation Age of Onset  . Alcohol abuse Mother   . Arthritis Mother   . Cancer Mother        ovary  . Hyperlipidemia Mother   . Hypertension Mother   . Diabetes Mother   . Arthritis Maternal Grandmother   . Hypertension Maternal Grandmother   . Cancer Maternal Grandmother        colon    Social History Social History   Tobacco Use  . Smoking status: Former Smoker    Quit date: 03/25/2003    Years since quitting: 15.7  . Smokeless tobacco: Never Used  Substance Use Topics  . Alcohol use: No    Alcohol/week: 0.0 standard drinks  . Drug use: Yes    Frequency: 0.4 times per week    Types: Marijuana    Comment: used  crack 12 years.     Review of Systems  Constitutional: No fever/chills Eyes: No visual changes. ENT: No sore throat. Respiratory: Denies cough Genitourinary: Negative for dysuria. Musculoskeletal: Negative for back pain.  Right lower leg swelling Skin: Negative for rash.    ____________________________________________   PHYSICAL EXAM:  VITAL SIGNS: ED Triage Vitals [12/31/18 1006]  Enc Vitals Group     BP (!) 163/72     Pulse Rate 92     Resp 18     Temp 97.8 F (36.6 C)     Temp Source Oral     SpO2 96 %     Weight      Height      Head Circumference      Peak Flow      Pain Score 4     Pain Loc      Pain Edu?      Excl. in [place]?      Constitutional: Alert and oriented. Well appearing and in no acute distress. Eyes: Conjunctivae are normal.  Head: Atraumatic. Ears: TMs are clear bilaterally Nose: No congestion/rhinnorhea. Mouth/Throat: Mucous membranes are moist.   Neck:  supple no lymphadenopathy noted Cardiovascular: Normal rate, regular rhythm. Heart sounds are normal Respiratory: Normal respiratory effort.  No retractions, lungs c t a  GU: deferred Musculoskeletal: FROM all extremities, warm and well perfused, right lower leg has swelling noted around the anterior of the ankle and the calf is tender to palpation Neurologic:  Normal speech and language.  Skin:  Skin is warm, dry and intact. No rash noted. Psychiatric: Mood and affect are normal. Speech and behavior are normal.  ____________________________________________   LABS (all labs ordered are listed, but only abnormal results are displayed)  Labs Reviewed - No data to display ____________________________________________   ____________________________________________  RADIOLOGY  Ultrasound venous right lower extremity is negative  ____________________________________________   PROCEDURES  Procedure(s) performed: No  Procedures    ____________________________________________   INITIAL IMPRESSION / ASSESSMENT AND PLAN / ED COURSE  Pertinent labs & imaging results that were available during my care of the patient were reviewed by me and considered in my medical decision making (see chart for details).   Patient is 43 year old female presents emergency department right lower leg pain.  See HPI  Physical exam shows patient to appear well.  Right lower leg is tender at the calf and anterior ankle.  Ultrasound of the right lower leg   Ultrasounds negative.  Does show some skin thickening which could be cellulitis.  Patient does have a small amount of redness noted at the area so we will treat her empirically with an antibiotic.  She is to  also wear support hose to help with swelling.  She was given a work note and was discharged in stable condition.   [name] was evaluated in Emergency Department on 12/31/2018 for the symptoms described in the history of present illness. She was evaluated in the context of the global COVID-19 pandemic, which necessitated consideration that the patient might be at risk for infection with the SARS-CoV-2 virus that causes COVID-19. Institutional protocols and algorithms that pertain to the evaluation of patients at risk for COVID-19 are in a state of rapid change based on information released by regulatory bodies including the CDC and federal and state organizations. These policies and algorithms were followed during the patient's care in the ED.   As part of my medical decision making, I reviewed the following data within the  electronic MEDICAL RECORD NUMBER Nursing notes reviewed and incorporated, Old chart reviewed, Notes from prior ED visits and [place] Controlled Substance Database  ____________________________________________   FINAL CLINICAL IMPRESSION(S) / ED DIAGNOSES  Final diagnoses:  Right leg pain  Cellulitis of right lower extremity      NEW MEDICATIONS STARTED DURING THIS VISIT:  Discharge Medication List as of 12/31/2018 12:36 PM       Note:  This document was prepared using Dragon voice recognition software and may include unintentional dictation errors.    Versie Starks, PA-C 12/31/18 1730    Lilia Pro., MD 01/01/19 [phone]

## 2018-12-31 NOTE — ED Triage Notes (Signed)
Pt arrives with complaints of right lower leg pain. Pt reports left ear pain as well. Pain started 3 days prior.

## 2018-12-31 NOTE — Discharge Instructions (Signed)
Follow-up with your regular doctor if not better in 3 to 4 days.  Return emergency department worsening.  Take medication as prescribed.

## 2018-12-31 NOTE — Telephone Encounter (Signed)
Pt said starting on 12/29/18 or 12/30/18 pt noticed soreness in rt lower leg and today swelling at lower leg has worsened and it is sore to touch and has redness.pt has pain in shin area. Pt is concerned she may have a blood clot in her leg; no CP,SOB,H/A or dizziness. Pt will go to [facility] ED for eval and possible imaging.

## 2018-12-31 NOTE — ED Notes (Signed)
See triage note  Presents with right lower leg discomfort and min swelling  Denies any injury  Also having pain to left ear

## 2018-12-31 NOTE — Telephone Encounter (Signed)
Agree Ed  [facility]

## 2019-01-03 ENCOUNTER — Ambulatory Visit (INDEPENDENT_AMBULATORY_CARE_PROVIDER_SITE_OTHER): Payer: BC Managed Care – PPO | Admitting: Family Medicine

## 2019-01-03 ENCOUNTER — Encounter: Payer: Self-pay | Admitting: Family Medicine

## 2019-01-03 ENCOUNTER — Other Ambulatory Visit: Payer: Self-pay

## 2019-01-03 VITALS — BP 140/80 | HR 76 | Temp 98.2°F | Ht 64.0 in | Wt 208.5 lb

## 2019-01-03 DIAGNOSIS — R600 Localized edema: Secondary | ICD-10-CM

## 2019-01-03 DIAGNOSIS — L03115 Cellulitis of right lower limb: Secondary | ICD-10-CM | POA: Diagnosis not present

## 2019-01-03 MED ORDER — DOXYCYCLINE HYCLATE 100 MG PO TABS: 100.0000 mg | ORAL_TABLET | Freq: Two times a day (BID) | ORAL | 0 refills | Status: AC

## 2019-01-03 NOTE — Progress Notes (Signed)
Nicole Oldenkamp T. Jeweliana Dudgeon, MD Primary Care and Sports Medicine [facility] at [facility] [place], [address] Phone: [phone]   FAX: [place] - 43 y.o. female   MRN [id number]   Date of Birth: 08-28-1975  Visit Date: 01/03/2019   PCP: Burnard Hawthorne, FNP   Referred by: Burnard Hawthorne, FNP  Chief Complaint  Patient presents with   Leg Pain    ?Cellulits-Seen in ED 12/31/18   Ear Pain    Left   Subjective:   Nicole Escobar is a 43 y.o. very pleasant female patient who presents with the following:  10/9 ER, started on Keflex 500 mg TID  50 hours in a aweek.  Woke up in the middle of the night and was a little bit swollen.  Workd o Tues night, went to work.  Big indent on the R side.  Went into the ER.  She is a very nice lady, and she presents with some pain that was treated as cellulitis several days ago while in the ER, date of visit December 31, 2018.  She was placed on Keflex 500 mg p.o. 3 times daily.  Per her report there was some redness and warmth at the site, and that has since resolved.  She does have 2 areas that have some minimal fluctuance without clear abscess.  Past Medical History, Surgical History, Social History, Family History, Problem List, Medications, and Allergies have been reviewed and updated if relevant.  Patient Active Problem List   Diagnosis Date Noted   GAD (generalized anxiety disorder) 02/08/2018   Lower abdominal pain 02/08/2018   Boil 02/08/2018   Elevated blood pressure reading 02/08/2018   Chest pain 02/08/2018   Left ear pain 11/18/2016   History of cocaine abuse ([place]) 04/03/2016   Influenza-like illness 05/14/2015   Benign paroxysmal positional vertigo 10/17/2014   Hand joint pain 05/16/2014   Bilateral carpal tunnel syndrome 05/16/2014   Stress headaches 03/16/2014    Past Medical History:  Diagnosis Date   Emphysema of lung ([place])    Frequent headaches    unknown onset    Past Surgical History:  Procedure Laterality Date   TONSILECTOMY/ADENOIDECTOMY WITH MYRINGOTOMY Bilateral    TUBAL LIGATION  2013    Social History   Socioeconomic History   Marital status: Single    Spouse name: Not on file   Number of children: Not on file   Years of education: Not on file   Highest education level: Not on file  Occupational History   Not on file  Social Needs   Financial resource strain: Not on file   Food insecurity    Worry: Not on file    Inability: Not on file   Transportation needs    Medical: Not on file    Non-medical: Not on file  Tobacco Use   Smoking status: Former Smoker    Quit date: 03/25/2003    Years since quitting: 15.7   Smokeless tobacco: Never Used  Substance and Sexual Activity   Alcohol use: No    Alcohol/week: 0.0 standard drinks   Drug use: Yes    Frequency: 0.4 times per week    Types: 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    Sexual activity: Yes    Partners: Male  Lifestyle   Physical activity    Days per week: Not on file    Minutes per session: Not on file  Stress: Not on file  Relationships   Social connections    Talks on phone: Not on file    Gets together: Not on file    Attends religious service: Not on file    Active member of club or organization: Not on file    Attends meetings of clubs or organizations: Not on file    Relationship status: Not on file   Intimate partner violence    Fear of current or ex partner: Not on file    Emotionally abused: Not on file    Physically abused: Not on file    Forced sexual activity: Not on file  Other Topics Concern   Not on file  Social History Narrative   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    77 year old daughter at [employer]   80 year old son   Long-term relationship    GED degree     Family History  Problem Relation Age of Onset   Alcohol abuse Mother    Arthritis Mother    Cancer Mother         ovary   Hyperlipidemia Mother    Hypertension Mother    Diabetes Mother    Arthritis Maternal Grandmother    Hypertension Maternal Grandmother    Cancer Maternal Grandmother        colon    Allergies  Allergen Reactions   Sulfa Antibiotics Rash    Medication list reviewed and updated in full in [place].  ROS: GEN: Acute illness details above GI: Tolerating PO intake GU: maintaining adequate hydration and urination Pulm: No SOB Interactive and getting along well at home.  Otherwise, ROS is as per the HPI.  Objective:   BP 140/80    Pulse 76    Temp 98.2 F (36.8 C) (Temporal)    Ht 5\' 4"  (1.626 m)    Wt 208 lb 8 oz (94.6 kg)    LMP 12/15/2018    SpO2 98%    BMI 35.79 kg/m    GEN: WDWN, NAD, Non-toxic, Alert & Oriented x 3 HEENT: Atraumatic, Normocephalic.  Ears and Nose: No external deformity. EXTR: No clubbing/cyanosis/edema NEURO: Normal gait.  PSYCH: Normally interactive. Conversant. Not depressed or anxious appearing.  Calm demeanor.    Right lower extremity, there is no tenderness along the tibia and fibula.  There is trace edema and the lower extremity, trace to 1+ edema on the dorsum of the foot.  Entirety of the foot and ankle exam is grossly nontender with no bony anatomy.  On the anterior shin there are 2 places that have some feeling as if they have some fluid, but no induration or significant fluctuance.  There is no warmth.  There is some minimal redness around the right-sided lower extremity.   Laboratory and Imaging Data: US Venous Img Lower Unilateral Right  Result Date: 12/31/2018 CLINICAL DATA:  Right lower extremity pain and edema pain. Family history of blood clots. Evaluate for DVT. EXAM: RIGHT LOWER EXTREMITY VENOUS DOPPLER ULTRASOUND TECHNIQUE: Gray-scale sonography with graded compression, as well as color Doppler and duplex ultrasound were performed to evaluate the lower extremity deep venous systems from the level of the common  femoral vein and including the common femoral, femoral, profunda femoral, popliteal and calf veins including the posterior tibial, peroneal and gastrocnemius veins when visible. The superficial great saphenous vein was also interrogated. Spectral Doppler was utilized to evaluate flow at rest and with distal augmentation maneuvers in the  common femoral, femoral and popliteal veins. COMPARISON:  None. FINDINGS: Contralateral Common Femoral Vein: Respiratory phasicity is normal and symmetric with the symptomatic side. No evidence of thrombus. Normal compressibility. Common Femoral Vein: No evidence of thrombus. Normal compressibility, respiratory phasicity and response to augmentation. Saphenofemoral Junction: No evidence of thrombus. Normal compressibility and flow on color Doppler imaging. Profunda Femoral Vein: No evidence of thrombus. Normal compressibility and flow on color Doppler imaging. Femoral Vein: No evidence of thrombus. Normal compressibility, respiratory phasicity and response to augmentation. Popliteal Vein: No evidence of thrombus. Normal compressibility, respiratory phasicity and response to augmentation. Calf Veins: No evidence of thrombus. Normal compressibility and flow on color Doppler imaging. Superficial Great Saphenous Vein: No evidence of thrombus. Normal compressibility. Venous Reflux:  None. Other Findings: There is a minimal amount of subcutaneous edema correlating with the patient's area of pain and swelling involving the dorsal aspect of the foot (images 35 and 36). No definable/drainable fluid collections. IMPRESSION: 1. No evidence of DVT within the right lower extremity. 2. Minimal amount of subcutaneous edema correlates with the patient's area of pain and swelling involving the dorsal aspect of the foot without definable/drainable fluid collection. Electronically Signed   By: Sandi Mariscal M.D.   On: 12/31/2018 12:04     Assessment and Plan:     ICD-10-CM   1. Cellulitis of right  leg  L03.115   2. Lower extremity edema  R60.0    I reassured the patient.  There is no DVT.  She does have some moderate lower extremity edema that is new for her.  I question whether this is incompletely treated cellulitis, there are 2 areas which could be early abscess.  I been to change her to doxycycline.  If symptoms worsen and she has presentation with abscess, recommended first warm compresses, and if these do not promptly resolve then follow-up in the office for potential surgical I&D.  Follow-up: No follow-ups on file.  Meds ordered this encounter  Medications   doxycycline (VIBRA-TABS) 100 MG tablet    Sig: Take 1 tablet (100 mg total) by mouth 2 (two) times daily for 10 days.    Dispense:  20 tablet    Refill:  0   No orders of the defined types were placed in this encounter.   Signed,  Maud Deed. Nerine Pulse, MD   Outpatient Encounter Medications as of 01/03/2019  Medication Sig   cephALEXin (KEFLEX) 500 MG capsule Take 1 capsule (500 mg total) by mouth 3 (three) times daily.   [DISCONTINUED] cyclobenzaprine (FLEXERIL) 5 MG tablet Take 1 tablet (5 mg total) by mouth 3 (three) times daily as needed for muscle spasms.   [DISCONTINUED] lisinopril (PRINIVIL,ZESTRIL) 5 MG tablet Take 1 tablet (5 mg total) by mouth daily.   [DISCONTINUED] methylPREDNISolone (MEDROL DOSEPAK) 4 MG TBPK tablet Take according to pack instructions   [DISCONTINUED] mupirocin ointment (BACTROBAN) 2 % Place 1 application into the nose 2 (two) times daily.   doxycycline (VIBRA-TABS) 100 MG tablet Take 1 tablet (100 mg total) by mouth 2 (two) times daily for 10 days.   [DISCONTINUED] albuterol (PROVENTIL HFA;VENTOLIN HFA) 108 (90 Base) MCG/ACT inhaler Inhale 2 puffs into the lungs every 6 (six) hours as needed for wheezing or shortness of breath.   No facility-administered encounter medications on file as of 01/03/2019.

## 2019-01-05 ENCOUNTER — Telehealth: Payer: Self-pay | Admitting: *Deleted

## 2019-01-05 ENCOUNTER — Other Ambulatory Visit: Payer: Self-pay

## 2019-01-05 ENCOUNTER — Ambulatory Visit (INDEPENDENT_AMBULATORY_CARE_PROVIDER_SITE_OTHER): Payer: BC Managed Care – PPO | Admitting: Family Medicine

## 2019-01-05 ENCOUNTER — Encounter: Payer: Self-pay | Admitting: Family Medicine

## 2019-01-05 ENCOUNTER — Telehealth: Payer: Self-pay

## 2019-01-05 VITALS — BP 140/76 | HR 83 | Temp 98.3°F | Ht 64.0 in | Wt 211.2 lb

## 2019-01-05 DIAGNOSIS — R600 Localized edema: Secondary | ICD-10-CM | POA: Diagnosis not present

## 2019-01-05 DIAGNOSIS — R0600 Dyspnea, unspecified: Secondary | ICD-10-CM | POA: Diagnosis not present

## 2019-01-05 DIAGNOSIS — M25562 Pain in left knee: Secondary | ICD-10-CM

## 2019-01-05 DIAGNOSIS — Z131 Encounter for screening for diabetes mellitus: Secondary | ICD-10-CM

## 2019-01-05 DIAGNOSIS — M25571 Pain in right ankle and joints of right foot: Secondary | ICD-10-CM

## 2019-01-05 DIAGNOSIS — Z87891 Personal history of nicotine dependence: Secondary | ICD-10-CM | POA: Diagnosis not present

## 2019-01-05 DIAGNOSIS — M25561 Pain in right knee: Secondary | ICD-10-CM | POA: Diagnosis not present

## 2019-01-05 DIAGNOSIS — M2550 Pain in unspecified joint: Secondary | ICD-10-CM

## 2019-01-05 DIAGNOSIS — M25572 Pain in left ankle and joints of left foot: Secondary | ICD-10-CM

## 2019-01-05 MED ORDER — FUROSEMIDE 20 MG PO TABS: ORAL_TABLET | ORAL | 0 refills | Status: DC

## 2019-01-05 NOTE — Telephone Encounter (Signed)
Called patient and appointment was scheduled today with Dr. Lorelei Pont.

## 2019-01-05 NOTE — Progress Notes (Signed)
Nicole Tarver T. Katharine Rochefort, MD Primary Care and Sports Medicine [facility] [place], [address] Phone: [phone]  FAX: [place] - 43 y.o. female  MRN [id number]  Date of Birth: 12/07/1975  Visit Date: 01/05/2019  PCP: Burnard Hawthorne, FNP  Referred by: Burnard Hawthorne, FNP  Chief Complaint  Patient presents with  . Foot Swelling    Not any better   Subjective:   Nicole Escobar is a 43 y.o. very pleasant female patient who presents with the following:  I was asked to emergently evaluate this patient.  I saw her two days ago with a question of possible cellulitis after 12/31/2018 ER visit.  "aches" up to her kneecaps.  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  At this point she is not having any redness or warmth at all, she is primarily concerned about the swelling in her feet.  This is quite painful for her per her report.  She is not having any other lower extremity edema.  She is not having any thigh edema.  She has no known history of heart problems, renal insufficiency, liver insufficiency or any other problem such as this.  History is significant for history of crack cocaine use, and she is not drank alcohol or used any substances per her report and 13 years.   GF had RA.  No other rheum.     Past Medical History, Surgical History, Social History, Family History, Problem List, Medications, and Allergies have been reviewed and updated if relevant.  Patient Active Problem List   Diagnosis Date Noted  . GAD (generalized anxiety disorder) 02/08/2018  . Lower abdominal pain 02/08/2018  . Boil 02/08/2018  . Elevated blood pressure reading 02/08/2018  . Chest pain 02/08/2018  . Left ear pain 11/18/2016  . History of cocaine abuse ([place]) 04/03/2016  . Influenza-like illness 05/14/2015  . Benign paroxysmal positional vertigo 10/17/2014  . Hand joint  pain 05/16/2014  . Bilateral carpal tunnel syndrome 05/16/2014  . Stress headaches 03/16/2014    Past Medical History:  Diagnosis Date  . Emphysema of lung ([place])   . Frequent headaches    unknown onset    Past Surgical History:  Procedure Laterality Date  . TONSILECTOMY/ADENOIDECTOMY WITH MYRINGOTOMY Bilateral   . TUBAL LIGATION  2013    Social History   Socioeconomic History  . Marital status: Single    Spouse name: Not on file  . Number of children: Not on file  . Years of education: Not on file  . Highest education level: Not on file  Occupational History  . Not on file  Social Needs  . Financial resource strain: Not on file  . Food insecurity    Worry: Not on file    Inability: Not on file  . Transportation needs    Medical: Not on file    Non-medical: Not on file  Tobacco Use  . Smoking status: Former Smoker    Quit date: 03/25/2003    Years since quitting: 15.7  . Smokeless tobacco: Never Used  Substance and Sexual Activity  . Alcohol use: No    Alcohol/week: 0.0 standard drinks  . 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   . Sexual activity: Yes    Partners: Male  Lifestyle  . Physical activity    Days per week: Not on file    Minutes per session: Not on file  . Stress: Not on file  Relationships  . Social Herbalist on phone: Not on file    Gets together: Not on file    Attends religious service: Not on file    Active member of club or organization: Not on file    Attends meetings of clubs or organizations: Not on file    Relationship status: Not on file  . Intimate partner violence    Fear of current or ex partner: Not on file    Emotionally abused: Not on file    Physically abused: Not on file    Forced sexual activity: Not on file  Other Topics Concern  . 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    71 year old  daughter at [place]   2 year old son   Long-term relationship    GED degree     Family History  Problem Relation Age of Onset  . Alcohol abuse Mother   . Arthritis Mother   . Cancer Mother        ovary  . Hyperlipidemia Mother   . Hypertension Mother   . Diabetes Mother   . Arthritis Maternal Grandmother   . Hypertension Maternal Grandmother   . Cancer Maternal Grandmother        colon    Allergies  Allergen Reactions  . Sulfa Antibiotics Rash    Medication list reviewed and updated in full in [place].   GEN: No acute illnesses, no fevers, chills. GI: No n/v/d, eating normally Pulm: No SOB Interactive and getting along well at home.  Otherwise, ROS is as per the HPI.  Objective:   BP 140/76   Pulse 83   Temp 98.3 F (36.8 C) (Temporal)   Ht 5\' 4"  (1.626 m)   Wt 211 lb 4 oz (95.8 kg)   LMP 12/15/2018   SpO2 96%   BMI 36.26 kg/m   GEN: WDWN, NAD, Non-toxic, A & O x 3 HEENT: Atraumatic, Normocephalic. Neck supple. No masses, No LAD. Ears and Nose: No external deformity. CV: RRR, No M/G/R. No JVD. No thrill. No extra heart sounds. PULM: CTA B, no wheezes, crackles, rhonchi. No retractions. No resp. distress. No accessory muscle use. EXTR: No c/c/1+ pedal edema bilaterally, but no other lower extremity edema. NEURO Normal gait.  PSYCH: Normally interactive. Conversant.  Mildly distraught.  Occasional crying.   Musculoskeletal, no bony tenderness throughout bilateral foot and ankles, bilateral tibia and fibula.  Patient does of note have significant increased laxity with anterior drawer test on the right.  Otherwise the musculoskeletal exam is normal.  Laboratory and Imaging Data:  Assessment and Plan:     ICD-10-CM   1. Lower extremity edema  R60.0 Brain natriuretic peptide    Vitamin 123456    Basic metabolic panel    CBC with Differential/Platelet    Hemoglobin A1c    TSH  2. Former smoker  Z87.891 Brain natriuretic peptide  3. Polyarthralgia   M25.50 ANA Screen,IFA,Reflex Titer/Pattern,Reflex Mplx 11 Ab Cascade with IdentRA    Vitamin 123456    Basic metabolic panel    CBC with Differential/Platelet    Sedimentation rate    High sensitivity CRP    B. burgdorfi antibodies by WB    HIV Antibody (routine testing  w rflx)    TSH  4. Acute bilateral knee pain  M25.561 ANA Screen,IFA,Reflex Titer/Pattern,Reflex Mplx 11 Ab Cascade with IdentRA   M25.562   5. Acute bilateral ankle pain  M25.571 ANA Screen,IFA,Reflex Titer/Pattern,Reflex Mplx 11 Ab Cascade with IdentRA   M25.572   6. Dyspnea, unspecified type  R06.00 Brain natriuretic peptide  7. Screening for diabetes mellitus  Z13.1 Hepatic function panel   Etiology is unclear.  Patient has had not had any labs in about 3 years.  New onset painful pedal edema.  The patient is somewhat distraught and tearful in the office.  I am to give her a short course of Lasix to see if this will alleviate her current pain.  I recommended that the patient get some athletic compression socks, but she was not enthusiastic about this recommendation.  Broad work-up with multiple possibilities and multiple organ systems that could be causing her symptoms. This is not an orthopedic problem.  Follow-up: No follow-ups on file.  Meds ordered this encounter  Medications  . furosemide (LASIX) 20 MG tablet    Sig: Take 1 tablet daily for 3 days, then every other day    Dispense:  5 tablet    Refill:  0   Orders Placed This Encounter  Procedures  . Brain natriuretic peptide  . ANA Screen,IFA,Reflex Titer/Pattern,Reflex Mplx 11 Ab Cascade with IdentRA  . Vitamin B12  . Basic metabolic panel  . CBC with Differential/Platelet  . Hemoglobin A1c  . Hepatic function panel  . Sedimentation rate  . High sensitivity CRP  . B. burgdorfi antibodies by WB  . HIV Antibody (routine testing w rflx)  . TSH    Signed,  Frederico Hamman T. Clydell Alberts, MD   Outpatient Encounter Medications as of 01/05/2019  Medication  Sig  . doxycycline (VIBRA-TABS) 100 MG tablet Take 1 tablet (100 mg total) by mouth 2 (two) times daily for 10 days.  . furosemide (LASIX) 20 MG tablet Take 1 tablet daily for 3 days, then every other day  . [DISCONTINUED] albuterol (PROVENTIL HFA;VENTOLIN HFA) 108 (90 Base) MCG/ACT inhaler Inhale 2 puffs into the lungs every 6 (six) hours as needed for wheezing or shortness of breath.  . [DISCONTINUED] cephALEXin (KEFLEX) 500 MG capsule Take 1 capsule (500 mg total) by mouth 3 (three) times daily.   No facility-administered encounter medications on file as of 01/05/2019.

## 2019-01-05 NOTE — Telephone Encounter (Signed)
Patient called stating that she was seen 2 days ago by Dr. Lorelei Pont. Patient stated that her right leg is not any better and there is fluid on the back and side of her ankle now. Patient stated that now her left ankle is the same as the right. Patient stated that both ankles hurt and there is redness and tenderness. Patient stated that she does not have a fever. Patient is concerned since she is not getting any better and now the problem is in her other ankle. Patient stated that there is a lot of fluid in both ankles today. Patient stated that she did eat some country ham last night, but has never had a problem with this before.

## 2019-01-05 NOTE — Telephone Encounter (Signed)
Called and spoke to patient.  Patient says that she already has an appt scheduled today at [facility].

## 2019-01-05 NOTE — Telephone Encounter (Signed)
Copied from [place] [id number]. Topic: Appointment Scheduling - Scheduling Inquiry for Clinic >> Jan 05, 2019  7:16 AM Nicole Escobar wrote: Reason for CRM: Pt needs an appt asap for pain and swelling in both legs/ please advise /Pt states she will call back

## 2019-01-05 NOTE — Telephone Encounter (Signed)
Emergent work-in today

## 2019-01-06 LAB — CBC WITH DIFFERENTIAL/PLATELET
Basophils Absolute: 0.1 10*3/uL (ref 0.0–0.1)
Basophils Relative: 0.7 % (ref 0.0–3.0)
Eosinophils Absolute: 0.2 10*3/uL (ref 0.0–0.7)
Eosinophils Relative: 2.3 % (ref 0.0–5.0)
HCT: 38.7 % (ref 36.0–46.0)
Hemoglobin: 12.8 g/dL (ref 12.0–15.0)
Lymphocytes Relative: 31.1 % (ref 12.0–46.0)
Lymphs Abs: 3.2 10*3/uL (ref 0.7–4.0)
MCHC: 33.2 g/dL (ref 30.0–36.0)
MCV: 85.8 fl (ref 78.0–100.0)
Monocytes Absolute: 0.5 10*3/uL (ref 0.1–1.0)
Monocytes Relative: 4.4 % (ref 3.0–12.0)
Neutro Abs: 6.3 10*3/uL (ref 1.4–7.7)
Neutrophils Relative %: 61.5 % (ref 43.0–77.0)
Platelets: 309 10*3/uL (ref 150.0–400.0)
RBC: 4.52 Mil/uL (ref 3.87–5.11)
RDW: 13.1 % (ref 11.5–15.5)
WBC: 10.2 10*3/uL (ref 4.0–10.5)

## 2019-01-06 LAB — HEMOGLOBIN A1C: Hgb A1c MFr Bld: 5.7 % (ref 4.6–6.5)

## 2019-01-06 LAB — TSH: TSH: 0.95 u[IU]/mL (ref 0.35–4.50)

## 2019-01-06 LAB — BASIC METABOLIC PANEL
BUN: 16 mg/dL (ref 6–23)
CO2: 27 mEq/L (ref 19–32)
Calcium: 9.5 mg/dL (ref 8.4–10.5)
Chloride: 105 mEq/L (ref 96–112)
Creatinine, Ser: 0.85 mg/dL (ref 0.40–1.20)
GFR: 72.86 mL/min (ref >60.00)
Glucose, Bld: 104 mg/dL — ABNORMAL HIGH (ref 70–99)
Potassium: 4.1 mEq/L (ref 3.5–5.1)
Sodium: 140 mEq/L (ref 135–145)

## 2019-01-06 LAB — HEPATIC FUNCTION PANEL
ALT: 16 U/L (ref 0–35)
AST: 18 U/L (ref 0–37)
Albumin: 4.3 g/dL (ref 3.5–5.2)
Alkaline Phosphatase: 68 U/L (ref 39–117)
Bilirubin, Direct: 0.1 mg/dL (ref 0.0–0.3)
Total Bilirubin: 0.3 mg/dL (ref 0.2–1.2)
Total Protein: 7 g/dL (ref 6.0–8.3)

## 2019-01-06 LAB — BRAIN NATRIURETIC PEPTIDE: Pro B Natriuretic peptide (BNP): 46 pg/mL (ref 0.0–100.0)

## 2019-01-06 LAB — VITAMIN B12: Vitamin B-12: 118 pg/mL — ABNORMAL LOW (ref 211–911)

## 2019-01-06 LAB — SEDIMENTATION RATE: Sed Rate: 19 mm/hr (ref 0–20)

## 2019-01-06 LAB — HIGH SENSITIVITY CRP: CRP, High Sensitivity: 14.49 mg/L — ABNORMAL HIGH (ref 0.000–5.000)

## 2019-01-07 ENCOUNTER — Telehealth: Payer: Self-pay

## 2019-01-07 NOTE — Telephone Encounter (Signed)
Copied from [place] [id number]. Topic: Quick Communication - Lab Results (Clinic Use ONLY) >> Jan 07, 2019  4:11 PM Pauline Good wrote: Pt calling for her recent lab results

## 2019-01-10 ENCOUNTER — Telehealth: Payer: Self-pay | Admitting: Family

## 2019-01-10 NOTE — Telephone Encounter (Signed)
done

## 2019-01-10 NOTE — Telephone Encounter (Signed)
I called patient & asked that she call [facility] since labs were ordered by Dr. Lorelei Pont.

## 2019-01-10 NOTE — Telephone Encounter (Signed)
Patient was in the office to see Dr Lorelei Pont and had some lab work done. She would like to know if these lab results have come back, she has not heard back from anyone

## 2019-01-11 LAB — B. BURGDORFI ANTIBODIES BY WB

## 2019-01-11 LAB — ANA SCREEN,IFA,REFLEX TITER/PATTERN,REFLEX MPLX 11 AB CASCADE
14-3-3 eta Protein: <0.2 ng/mL (ref <0.2)
Anti Nuclear Antibody (ANA): POSITIVE — AB
Cyclic Citrullin Peptide Ab: <16 UNITS
Rheumatoid fact SerPl-aCnc: <14 IU/mL (ref <14)

## 2019-01-11 LAB — ANTI-NUCLEAR AB-TITER (ANA TITER): ANA Titer 1: 1:40 {titer} — ABNORMAL HIGH

## 2019-01-11 LAB — TIER 2
Jo-1 Autoabs: <1 AI
SSA (Ro) (ENA) Antibody, IgG: <1 AI
SSB (La) (ENA) Antibody, IgG: <1 AI
Scleroderma (Scl-70) (ENA) Antibody, IgG: <1 AI

## 2019-01-11 LAB — INTERPRETATION

## 2019-01-11 LAB — TIER 1
Chromatin (Nucleosomal) Antibody: <1 AI
ENA SM Ab Ser-aCnc: <1 AI
Ribonucleic Protein(ENA) Antibody, IgG: <1 AI
SM/RNP: <1 AI
ds DNA Ab: 1 IU/mL

## 2019-01-11 LAB — HIV ANTIBODY (ROUTINE TESTING W REFLEX): HIV 1&2 Ab, 4th Generation: NONREACTIVE

## 2019-01-11 LAB — TIER 3
Centromere Ab Screen: <1 AI
Ribosomal P Protein Ab: <1 AI

## 2019-02-13 ENCOUNTER — Encounter: Payer: Self-pay | Admitting: Physician Assistant

## 2019-02-13 ENCOUNTER — Other Ambulatory Visit: Payer: Self-pay

## 2019-02-13 ENCOUNTER — Emergency Department
Admission: EM | Admit: 2019-02-13 | Discharge: 2019-02-13 | Disposition: A | Discharge status: Home / Self Care | Payer: BC Managed Care – PPO | Attending: Student in an Organized Health Care Education/Training Program | Admitting: Student in an Organized Health Care Education/Training Program

## 2019-02-13 DIAGNOSIS — K1370 Unspecified lesions of oral mucosa: Secondary | ICD-10-CM | POA: Diagnosis not present

## 2019-02-13 DIAGNOSIS — Z79899 Other long term (current) drug therapy: Secondary | ICD-10-CM | POA: Insufficient documentation

## 2019-02-13 DIAGNOSIS — K122 Cellulitis and abscess of mouth: Secondary | ICD-10-CM | POA: Diagnosis not present

## 2019-02-13 DIAGNOSIS — Z87891 Personal history of nicotine dependence: Secondary | ICD-10-CM | POA: Insufficient documentation

## 2019-02-13 DIAGNOSIS — R0981 Nasal congestion: Secondary | ICD-10-CM

## 2019-02-13 MED ORDER — FLUTICASONE PROPIONATE 50 MCG/ACT NA SUSP: 2.0000 | Freq: Every day | NASAL | 0 refills | Status: DC

## 2019-02-13 MED ORDER — CETIRIZINE-PSEUDOEPHEDRINE ER 5-120 MG PO TB12: 1.0000 | ORAL_TABLET | Freq: Two times a day (BID) | ORAL | 0 refills | Status: AC

## 2019-02-13 MED ORDER — PREDNISONE 20 MG PO TABS
60.0000 mg | ORAL_TABLET | Freq: Once | ORAL | Status: AC
  Administered 2019-02-13: 12:00:00 60 mg via ORAL
  Filled 2019-02-13: qty 3

## 2019-02-13 MED ORDER — PREDNISONE 20 MG PO TABS: 20.0000 mg | ORAL_TABLET | Freq: Two times a day (BID) | ORAL | 0 refills | Status: AC

## 2019-02-13 NOTE — ED Provider Notes (Signed)
[facility] Emergency Department Provider Note ____________________________________________  Time seen: 1203  I have reviewed the triage vital signs and the nursing notes.  HISTORY  Chief Complaint  Nasal Congestion and Cough  HPI Nicole Escobar is a 43 y.o. female presents herself to the ED for evaluation of swelling to the uvula.  Patient describes she has had several days of cough, congestion, and postnasal drip.  She reports awakening this morning with some fullness to her uvula.  She describes similar symptoms in the past.  She took a dose of NyQuil as well as a single dose of a leftover Keflex antibiotic she had.  She presents with some improvement of her symptoms, denies any difficulty breathing, swallowing, or controlling secretions.  Past Medical History:  Diagnosis Date  . Emphysema of lung ([place])   . Frequent headaches    unknown onset    Patient Active Problem List   Diagnosis Date Noted  . GAD (generalized anxiety disorder) 02/08/2018  . Lower abdominal pain 02/08/2018  . Boil 02/08/2018  . Elevated blood pressure reading 02/08/2018  . Chest pain 02/08/2018  . Left ear pain 11/18/2016  . History of cocaine abuse ([place]) 04/03/2016  . Influenza-like illness 05/14/2015  . Benign paroxysmal positional vertigo 10/17/2014  . Hand joint pain 05/16/2014  . Bilateral carpal tunnel syndrome 05/16/2014  . Stress headaches 03/16/2014    Past Surgical History:  Procedure Laterality Date  . TONSILECTOMY/ADENOIDECTOMY WITH MYRINGOTOMY Bilateral   . TUBAL LIGATION  2013    Prior to Admission medications   Medication Sig Start Date End Date Taking? Authorizing Provider  cetirizine-pseudoephedrine (ZYRTEC-D) 5-120 MG tablet Take 1 tablet by mouth 2 (two) times daily for 10 days. 02/13/19 02/23/19  Avian Konigsberg, Dannielle Karvonen, PA-C  fluticasone (FLONASE) 50 MCG/ACT nasal spray Place 2 sprays into both nostrils daily. 02/13/19   Reily Treloar, Dannielle Karvonen, PA-C   furosemide (LASIX) 20 MG tablet Take 1 tablet daily for 3 days, then every other day 01/05/19   [place], Frederico Hamman, MD  predniSONE (DELTASONE) 20 MG tablet Take 1 tablet (20 mg total) by mouth 2 (two) times daily with a meal for 5 days. 02/13/19 02/18/19  Reni Hausner, Dannielle Karvonen, PA-C  albuterol (PROVENTIL HFA;VENTOLIN HFA) 108 (90 Base) MCG/ACT inhaler Inhale 2 puffs into the lungs every 6 (six) hours as needed for wheezing or shortness of breath. 05/17/15 06/12/15  Rubbie Battiest, RN    Allergies Sulfa antibiotics  Family History  Problem Relation Age of Onset  . Alcohol abuse Mother   . Arthritis Mother   . Cancer Mother        ovary  . Hyperlipidemia Mother   . Hypertension Mother   . Diabetes Mother   . Arthritis Maternal Grandmother   . Hypertension Maternal Grandmother   . Cancer Maternal Grandmother        colon    Social History Social History   Tobacco Use  . Smoking status: Former Smoker    Quit date: 03/25/2003    Years since quitting: 15.9  . Smokeless tobacco: Never Used  Substance Use Topics  . Alcohol use: No    Alcohol/week: 0.0 standard drinks  . 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     Review of Systems  Constitutional: Negative for fever. Eyes: Negative for visual changes. ENT: Negative for sore throat.  Sinus congestion and uvula swelling  as above. Cardiovascular: Negative for chest pain. Respiratory: Negative for shortness of breath. Gastrointestinal: Negative for abdominal pain, vomiting and diarrhea. Genitourinary: Negative for dysuria. Musculoskeletal: Negative for back pain. Skin: Negative for rash. Neurological: Negative for headaches, focal weakness or numbness. ____________________________________________  PHYSICAL EXAM:  VITAL SIGNS: ED Triage Vitals  Enc Vitals Group     BP 02/13/19 1121 (!) 146/73     Pulse Rate 02/13/19 1121 72     Resp 02/13/19 1121 18     Temp  02/13/19 1121 97.6 F (36.4 C)     Temp Source 02/13/19 1121 Oral     SpO2 02/13/19 1121 94 %     Weight 02/13/19 1122 211 lb (95.7 kg)     Height 02/13/19 1122 5\' 4"  (1.626 m)     Head Circumference --      Peak Flow --      Pain Score 02/13/19 1124 6     Pain Loc --      Pain Edu? --      Excl. in [place]? --     Constitutional: Alert and oriented. Well appearing and in no distress. Head: Normocephalic and atraumatic. Eyes: Conjunctivae are normal. PERRL. Normal extraocular movements Ears: Canals clear. TMs intact bilaterally. Nose: No congestion/rhinorrhea/epistaxis. Mouth/Throat: Mucous membranes are moist.  Uvula is midline and perhaps mildly edematous.  No swelling or edema to cause airway compromise.  Patient is talking in complete sentences without any difficulty breathing or swallowing. Neck: Supple. No thyromegaly. Hematological/Lymphatic/Immunological: No cervical lymphadenopathy. Cardiovascular: Normal rate, regular rhythm. Normal distal pulses. Respiratory: Normal respiratory effort.  Musculoskeletal: Nontender with normal range of motion in all extremities.  Neurologic:  Normal gait without ataxia. Normal speech and language. No gross focal neurologic deficits are appreciated. Skin:  Skin is warm, dry and intact. No rash noted. ____________________________________________  PROCEDURES  Prednisone 60 mg p.o. Procedures ____________________________________________  INITIAL IMPRESSION / ASSESSMENT AND PLAN / ED COURSE  Patient with ED evaluation of concern for possible uvula swelling.  Patient clinical picture is overall reassuring as it shows no signs of acute respiratory distress, airway compromise, or acute soft tissue infection.  Patient is treated empirically with a single dose of prednisone in the ED.  She will be discharged with prescriptions for the same, as well as Zyrtec-pseudoephedrine and Flonase for symptom relief.  She will follow-up with primary provider  return to the ED as needed.  Nicole Escobar was evaluated in Emergency Department on 02/13/2019 for the symptoms described in the history of present illness. She was evaluated in the context of the global COVID-19 pandemic, which necessitated consideration that the patient might be at risk for infection with the SARS-CoV-2 virus that causes COVID-19. Institutional protocols and algorithms that pertain to the evaluation of patients at risk for COVID-19 are in a state of rapid change based on information released by regulatory bodies including the CDC and federal and state organizations. These policies and algorithms were followed during the patient's care in the ED. ____________________________________________  FINAL CLINICAL IMPRESSION(S) / ED DIAGNOSES  Final diagnoses:  Uvulitis  Nasal congestion      Zaine Elsass, Dannielle Karvonen, PA-C 02/13/19 1820    Merlyn Lot, MD 02/14/19 [id number]

## 2019-02-13 NOTE — ED Notes (Signed)
NAD noted at time of D/C. Pt denies questions or concerns. Pt ambulatory to the lobby at this time.  

## 2019-02-13 NOTE — Discharge Instructions (Addendum)
You are being treated for a possible inflammatory response in your uvula. You should take the prescription meds as directed. Return to the ED for any difficulty breathing or swallowing.

## 2019-02-13 NOTE — ED Notes (Signed)
Pt presents to ED via POV states, "I have a head cold and I took some nyquil last night and slept with my mouth open and now my hangy down thing is swollen". Upon assessment pt with noted nasal congestion at this time, and redness and mild swelling to her uvula. Pt able to maintain airway and maintain her own secretions without difficulty at this time.

## 2019-02-13 NOTE — ED Triage Notes (Addendum)
Pt arrived via POV with reports of cold sxs that started on Friday, states son had cold a few days prior, pt states she took Nyquil and states she feels like her uvula is swollen. Pt reports this has happened to her before.  Pt states she took 1 dose of Keflex at home in case it was an infection.  Pt denies any difficulty breathing, states she thinks it is from being dried out.  Uvula does appear to be swollen, but not obstructing air way. Pt able to speak in complete sentences without any difficulty. Pt denies any sore throat and is maintaining secretions.

## 2019-04-07 ENCOUNTER — Encounter: Payer: Self-pay | Admitting: Family Medicine

## 2019-04-07 ENCOUNTER — Ambulatory Visit (INDEPENDENT_AMBULATORY_CARE_PROVIDER_SITE_OTHER): Payer: BC Managed Care – PPO | Admitting: Family Medicine

## 2019-04-07 ENCOUNTER — Other Ambulatory Visit: Payer: Self-pay

## 2019-04-07 VITALS — BP 150/88 | HR 68 | Temp 98.0°F | Ht 64.0 in | Wt 210.2 lb

## 2019-04-07 DIAGNOSIS — M2550 Pain in unspecified joint: Secondary | ICD-10-CM

## 2019-04-07 DIAGNOSIS — M79604 Pain in right leg: Secondary | ICD-10-CM | POA: Diagnosis not present

## 2019-04-07 DIAGNOSIS — M79605 Pain in left leg: Secondary | ICD-10-CM | POA: Diagnosis not present

## 2019-04-07 MED ORDER — PREDNISONE 20 MG PO TABS: ORAL_TABLET | ORAL | 0 refills | Status: DC

## 2019-04-07 MED ORDER — ALBUTEROL SULFATE HFA 108 (90 BASE) MCG/ACT IN AERS: 2.0000 | INHALATION_SPRAY | Freq: Four times a day (QID) | RESPIRATORY_TRACT | 0 refills | Status: DC | PRN

## 2019-04-07 NOTE — Progress Notes (Signed)
Makell Drohan T. Verle Brillhart, MD Primary Care and Sports Medicine [facility] at [facility] [place], [address] Phone: [phone]  FAX: [place] - 44 y.o. female  MRN [id number]  Date of Birth: 03-06-1976  Visit Date: 04/07/2019  PCP: Burnard Hawthorne, FNP  Referred by: Burnard Hawthorne, FNP  Chief Complaint  Patient presents with  . Leg Pain    Bilateral  . Foot Pain    Heel Pain-Bilateal    This visit occurred during the SARS-CoV-2 public health emergency.  Safety protocols were in place, including screening questions prior to the visit, additional usage of staff PPE, and extensive cleaning of exam room while observing appropriate contact time as indicated for disinfecting solutions.   Subjective:   Nicole Escobar is a 44 y.o. very pleasant female patient with Body mass index is 36.09 kg/m. who presents with the following:  I saw her in 12/2018, and did a large rheum work-up, which was globally negative.  B12 level was low.    In the office today she is quite tearful throughout the entirety of the interview and exam.  She is having some pain in the left anterior aspect of both of her shins.  She already has had an ultrasound that ruled out DVT.  There was a small collection of fluid anteriorly.  She feels as if these have a different sensation.  She also has some mild pain in the posterior aspect of her heel. She describes these sensations in her lower extremity like a burning sensation.  L anterior 1 day history.  Small lumps and a different sensation. Full. Only  Feels like a sickness.   Ant and posterior to achilles.  Past Medical History, Surgical History, Social History, Family History, Problem List, Medications, and Allergies have been reviewed and updated if relevant.   GEN: No fevers, chills. Nontoxic. Primarily MSK c/o today. MSK: Detailed in the HPI GI: tolerating PO intake without difficulty Neuro:  No numbness, parasthesias, or tingling associated. Otherwise the pertinent positives of the ROS are noted above.   Objective:   BP (!) 150/88   Pulse 68   Temp 98 F (36.7 C) (Temporal)   Ht 5\' 4"  (1.626 m)   Wt 210 lb 4 oz (95.4 kg)   SpO2 97%   BMI 36.09 kg/m    GEN: WDWN, NAD, Non-toxic, Alert & Oriented x 3 HEENT: Atraumatic, Normocephalic.  Ears and Nose: No external deformity. EXTR: No clubbing/cyanosis/edema mild edema / minimal NEURO: Normal gait.  PSYCH: Normally interactive. Conversant. Not depressed or anxious appearing.  Calm demeanor.    Bilateral lower extremities.  Negative Homans' sign.  She does have some pain primarily in the anterior aspect of her shin, and she moves all of her joints with full range of motion including at the knee, hip, foot and ankle.  She is sensitive to light touch.  This is on generally the entirety of the lower extremities.  She denies any of this in her upper extremities, torso in any location.  Radiology: Results for orders placed or performed in visit on 01/05/19  Brain natriuretic peptide  Result Value Ref Range   Pro B Natriuretic peptide (BNP) 46.0 0.0 - 100.0 pg/mL  ANA Screen,IFA,Reflex Titer/Pattern,Reflex Mplx 11 Ab Cascade with IdentRA  Result Value Ref Range   Anti Nuclear Antibody (ANA) POSITIVE (A) NEGATIVE   Rhuematoid fact SerPl-aCnc 99991111 99991111 IU/mL   Cyclic Citrullin Peptide Ab <16  UNITS   14-3-3 eta Protein <0.2 <0.2 ng/mL  Vitamin B12  Result Value Ref Range   Vitamin B-12 118 (L) 211 - 911 pg/mL  Basic metabolic panel  Result Value Ref Range   Sodium 140 135 - 145 mEq/L   Potassium 4.1 3.5 - 5.1 mEq/L   Chloride 105 96 - 112 mEq/L   CO2 27 19 - 32 mEq/L   Glucose, Bld 104 (H) 70 - 99 mg/dL   BUN 16 6 - 23 mg/dL   Creatinine, Ser 0.85 0.40 - 1.20 mg/dL   Calcium 9.5 8.4 - 10.5 mg/dL   GFR 72.86 >60.00 mL/min  CBC with Differential/Platelet  Result Value Ref Range   WBC 10.2 4.0 - 10.5 K/uL   RBC 4.52  3.87 - 5.11 Mil/uL   Hemoglobin 12.8 12.0 - 15.0 g/dL   HCT 38.7 36.0 - 46.0 %   MCV 85.8 78.0 - 100.0 fl   MCHC 33.2 30.0 - 36.0 g/dL   RDW 13.1 11.5 - 15.5 %   Platelets 309.0 150.0 - 400.0 K/uL   Neutrophils Relative % 61.5 43.0 - 77.0 %   Lymphocytes Relative 31.1 12.0 - 46.0 %   Monocytes Relative 4.4 3.0 - 12.0 %   Eosinophils Relative 2.3 0.0 - 5.0 %   Basophils Relative 0.7 0.0 - 3.0 %   Neutro Abs 6.3 1.4 - 7.7 K/uL   Lymphs Abs 3.2 0.7 - 4.0 K/uL   Monocytes Absolute 0.5 0.1 - 1.0 K/uL   Eosinophils Absolute 0.2 0.0 - 0.7 K/uL   Basophils Absolute 0.1 0.0 - 0.1 K/uL  Hemoglobin A1c  Result Value Ref Range   Hgb A1c MFr Bld 5.7 4.6 - 6.5 %  Hepatic function panel  Result Value Ref Range   Total Bilirubin 0.3 0.2 - 1.2 mg/dL   Bilirubin, Direct 0.1 0.0 - 0.3 mg/dL   Alkaline Phosphatase 68 39 - 117 U/L   AST 18 0 - 37 U/L   ALT 16 0 - 35 U/L   Total Protein 7.0 6.0 - 8.3 g/dL   Albumin 4.3 3.5 - 5.2 g/dL  Sedimentation rate  Result Value Ref Range   Sed Rate 19 0 - 20 mm/hr  High sensitivity CRP  Result Value Ref Range   CRP, High Sensitivity 14.490 (H) 0.000 - 5.000 mg/L  B. burgdorfi antibodies by WB  Result Value Ref Range   B burgdorferi IgG Abs (IB) NEGATIVE NEGATIVE   Lyme Disease 18 kD IgG NON-REACTIVE    Lyme Disease 23 kD IgG NON-REACTIVE    Lyme Disease 28 kD IgG NON-REACTIVE    Lyme Disease 30 kD IgG NON-REACTIVE    Lyme Disease 39 kD IgG NON-REACTIVE    Lyme Disease 41 kD IgG NON-REACTIVE    Lyme Disease 45 kD IgG NON-REACTIVE    Lyme Disease 58 kD IgG NON-REACTIVE    Lyme Disease 66 kD IgG NON-REACTIVE    Lyme Disease 93 kD IgG NON-REACTIVE    B burgdorferi IgM Abs (IB) NEGATIVE NEGATIVE   Lyme Disease 23 kD IgM NON-REACTIVE    Lyme Disease 39 kD IgM NON-REACTIVE    Lyme Disease 41 kD IgM NON-REACTIVE   HIV Antibody (routine testing w rflx)  Result Value Ref Range   HIV 1&2 Ab, 4th Generation NON-REACTIVE NON-REACTI  TSH  Result Value Ref  Range   TSH 0.95 0.35 - 4.50 uIU/mL  Anti-nuclear ab-titer (ANA titer)  Result Value Ref Range   ANA Titer 1 1:40 (H) titer  ANA Pattern 1 Nuclear, Speckled (A)   TIER 1  Result Value Ref Range   ds DNA Ab 1 IU/mL   ENA SM Ab Ser-aCnc <1.0 NEG <1.0 NEG AI   SM/RNP <1.0 NEG <1.0 NEG AI   Ribonucleic Protein(ENA) Antibody, IgG <1.0 NEG <1.0 NEG AI   Chromatin (Nucleosomal) Antibody <1.0 NEG <1.0 NEG AI  TIER 2  Result Value Ref Range   SSA (Ro) (ENA) Antibody, IgG <1.0 NEG <1.0 NEG AI   SSB (La) (ENA) Antibody, IgG <1.0 NEG <1.0 NEG AI   Scleroderma (Scl-70) (ENA) Antibody, IgG <1.0 NEG <1.0 NEG AI   Jo-1 Autoabs <1.0 NEG <1.0 NEG AI  Tier 3  Result Value Ref Range   Centromere Ab Screen <1.0 NEG <1.0 NEG AI   Ribosomal P Protein Ab <1.0 NEG <1.0 NEG AI  Interpretation  Result Value Ref Range   INTERPRETATION     Prior u/s reviewed. No DVT, minor swelling. Electronically Signed  By: Owens Loffler, MD On: 04/08/2019 11:52 AM   Assessment and Plan:     ICD-10-CM   1. Polyarthralgia  M25.50 Ambulatory referral to Rheumatology  2. Leg pain, bilateral  M79.604 Ambulatory referral to Rheumatology   M79.605    These do not appear to be orthopedic or sports medicine conditions.  Total encounter time: 30-39 minutes. On the day of the patient encounter, this can include review of prior records, labs, and imaging.  Additional time can include counselling, consultation with peer MD in person or by telephone.  This also includes independent review of Radiology.  I suspect that this is a myofascial pain syndrome, possibly fibromyalgia.  It could also be a variant of complex regional pain syndrome.  I gave her some prednisone to take.  My entire rheumatological work-up was grossly unremarkable.  At this point I do not know what I can offer to the patient, so would like to get rheumatological opinion.  Follow-up: No follow-ups on file.  Meds ordered this encounter  Medications  .  albuterol (VENTOLIN HFA) 108 (90 Base) MCG/ACT inhaler    Sig: Inhale 2 puffs into the lungs every 6 (six) hours as needed for wheezing or shortness of breath.    Dispense:  8 g    Refill:  0    Product selection for cheapest is permitted  . predniSONE (DELTASONE) 20 MG tablet    Sig: 2 tabs po daily for 5 days, then 1 tab po daily for 5 days    Dispense:  15 tablet    Refill:  0   Orders Placed This Encounter  Procedures  . Ambulatory referral to Rheumatology    Signed,  Frederico Hamman T. River Mckercher, MD   Outpatient Encounter Medications as of 04/07/2019  Medication Sig  . albuterol (VENTOLIN HFA) 108 (90 Base) MCG/ACT inhaler Inhale 2 puffs into the lungs every 6 (six) hours as needed for wheezing or shortness of breath.  . predniSONE (DELTASONE) 20 MG tablet 2 tabs po daily for 5 days, then 1 tab po daily for 5 days  . [DISCONTINUED] albuterol (PROVENTIL HFA;VENTOLIN HFA) 108 (90 Base) MCG/ACT inhaler Inhale 2 puffs into the lungs every 6 (six) hours as needed for wheezing or shortness of breath.  . [DISCONTINUED] fluticasone (FLONASE) 50 MCG/ACT nasal spray Place 2 sprays into both nostrils daily.  . [DISCONTINUED] furosemide (LASIX) 20 MG tablet Take 1 tablet daily for 3 days, then every other day   No facility-administered encounter medications on file as of 04/07/2019.

## 2019-04-27 DIAGNOSIS — E538 Deficiency of other specified B group vitamins: Secondary | ICD-10-CM | POA: Diagnosis not present

## 2019-04-27 DIAGNOSIS — M2550 Pain in unspecified joint: Secondary | ICD-10-CM | POA: Insufficient documentation

## 2019-04-27 DIAGNOSIS — M130 Polyarthritis, unspecified: Secondary | ICD-10-CM | POA: Diagnosis not present

## 2019-04-27 LAB — HM HEPATITIS C SCREENING LAB: HM Hepatitis Screen: NEGATIVE

## 2019-05-11 DIAGNOSIS — I83813 Varicose veins of bilateral lower extremities with pain: Secondary | ICD-10-CM | POA: Insufficient documentation

## 2019-05-11 DIAGNOSIS — E669 Obesity, unspecified: Secondary | ICD-10-CM | POA: Diagnosis not present

## 2019-05-11 DIAGNOSIS — R3121 Asymptomatic microscopic hematuria: Secondary | ICD-10-CM | POA: Diagnosis not present

## 2019-05-11 DIAGNOSIS — M2550 Pain in unspecified joint: Secondary | ICD-10-CM | POA: Diagnosis not present

## 2019-05-25 ENCOUNTER — Other Ambulatory Visit (INDEPENDENT_AMBULATORY_CARE_PROVIDER_SITE_OTHER): Payer: Self-pay | Admitting: Nurse Practitioner

## 2019-05-25 DIAGNOSIS — I8390 Asymptomatic varicose veins of unspecified lower extremity: Secondary | ICD-10-CM

## 2019-05-30 ENCOUNTER — Encounter (INDEPENDENT_AMBULATORY_CARE_PROVIDER_SITE_OTHER): Payer: BC Managed Care – PPO

## 2019-05-30 ENCOUNTER — Encounter (INDEPENDENT_AMBULATORY_CARE_PROVIDER_SITE_OTHER): Payer: PRIVATE HEALTH INSURANCE | Admitting: Nurse Practitioner

## 2019-06-30 ENCOUNTER — Encounter (INDEPENDENT_AMBULATORY_CARE_PROVIDER_SITE_OTHER): Payer: Self-pay | Admitting: Nurse Practitioner

## 2019-07-04 ENCOUNTER — Encounter (INDEPENDENT_AMBULATORY_CARE_PROVIDER_SITE_OTHER): Payer: PRIVATE HEALTH INSURANCE

## 2019-07-04 ENCOUNTER — Encounter (INDEPENDENT_AMBULATORY_CARE_PROVIDER_SITE_OTHER): Payer: PRIVATE HEALTH INSURANCE | Admitting: Nurse Practitioner

## 2019-07-26 ENCOUNTER — Encounter: Payer: Self-pay | Admitting: Nurse Practitioner

## 2019-07-26 ENCOUNTER — Ambulatory Visit: Payer: BC Managed Care – PPO | Admitting: Nurse Practitioner

## 2019-07-26 ENCOUNTER — Other Ambulatory Visit: Payer: Self-pay

## 2019-07-26 VITALS — BP 138/82 | HR 88 | Temp 96.9°F | Ht 64.0 in | Wt 208.0 lb

## 2019-07-26 DIAGNOSIS — K645 Perianal venous thrombosis: Secondary | ICD-10-CM | POA: Insufficient documentation

## 2019-07-26 MED ORDER — DOCUSATE SODIUM 100 MG PO CAPS: 100.0000 mg | ORAL_CAPSULE | Freq: Two times a day (BID) | ORAL | 0 refills | Status: DC

## 2019-07-26 MED ORDER — HYDROCORTISONE ACETATE 25 MG RE SUPP: 25.0000 mg | Freq: Two times a day (BID) | RECTAL | 1 refills | Status: DC

## 2019-07-26 NOTE — Progress Notes (Signed)
Established Patient Office Visit  Subjective:  Patient ID: Nicole Escobar, female    DOB: October 27, 1975  Age: 44 y.o. MRN: [id number]  CC:  Chief Complaint  Patient presents with  . Acute Visit    painful hemorrhoids    HPI Nicole Escobar is a 44 yo patient with hx of substance abuse, GAD, elevated BP, who presents for concerns about a painful hemorrhoid that she felt hanging out of the rectum on Sunday.  She recalls Saturday night getting up at night and having a bowel movement that was formed, not- hard,  and no straining, but she did have discomfort in the rectum.  She does not take a stool softener or laxative.  Her bowel movements are always easy to pass, formed, and she has had no bleeding. She has tried no medications for her hemorrhoid. She does not take a stool softener or laxative. She has no abdominal pain.  No straining.  No heavy lifting.  She does work at [employer] and she is on her feet during the day. She did recall having a hemorrhoid when she had her first child 25 years ago. No hx of colonoscopy.   Her maternal grandmother had colon cancer in her 61's.  No first-degree relative with history of colon cancer or colon polyps that she is aware.  Past Medical History:  Diagnosis Date  . Emphysema of lung ([place])   . Frequent headaches    unknown onset    Past Surgical History:  Procedure Laterality Date  . TONSILECTOMY/ADENOIDECTOMY WITH MYRINGOTOMY Bilateral   . TUBAL LIGATION  2013    Family History  Problem Relation Age of Onset  . Alcohol abuse Mother   . Arthritis Mother   . Cancer Mother        ovary  . Hyperlipidemia Mother   . Hypertension Mother   . Diabetes Mother   . Arthritis Maternal Grandmother   . Hypertension Maternal Grandmother   . Cancer Maternal Grandmother        colon    Social History   Socioeconomic History  . Marital status: Single    Spouse name: Not on file  . Number of children: Not on file  . Years of education: Not on file    . Highest education level: Not on file  Occupational History  . Not on file  Tobacco Use  . Smoking status: Former Smoker    Quit date: 03/25/2003    Years since quitting: 16.3  . Smokeless tobacco: Never Used  Substance and Sexual Activity  . Alcohol use: No    Alcohol/week: 0.0 standard drinks  . 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   . Sexual activity: Yes    Partners: Male  Other Topics Concern  . 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    38 year old daughter at [employer]   32 year old son   Long-term relationship    GED degree    Social Determinants of Health   Financial Resource Strain:   . Difficulty of Paying Living Expenses:   Food Insecurity:   . Worried About Charity fundraiser in the Last Year:   . Arboriculturist in the Last Year:   Transportation Needs:   . Film/video editor (Medical):   Marland Kitchen Lack of Transportation (  Non-Medical):   Physical Activity:   . Days of Exercise per Week:   . Minutes of Exercise per Session:   Stress:   . Feeling of Stress :   Social Connections:   . Frequency of Communication with Friends and Family:   . Frequency of Social Gatherings with Friends and Family:   . Attends Religious Services:   . Active Member of Clubs or Organizations:   . Attends Archivist Meetings:   Marland Kitchen Marital Status:   Intimate Partner Violence:   . Fear of Current or Ex-Partner:   . Emotionally Abused:   Marland Kitchen Physically Abused:   . Sexually Abused:     Outpatient Medications Prior to Visit  Medication Sig Dispense Refill  . albuterol (VENTOLIN HFA) 108 (90 Base) MCG/ACT inhaler Inhale 2 puffs into the lungs every 6 (six) hours as needed for wheezing or shortness of breath. 8 g 0  . predniSONE (DELTASONE) 20 MG tablet 2 tabs po daily for 5 days, then 1 tab po daily for 5 days 15 tablet 0   No facility-administered medications  prior to visit.    Allergies  Allergen Reactions  . Sulfa Antibiotics Rash    ROS Pertinent positives noted in history of present illness.   Objective:    Physical Exam  Constitutional: She appears well-developed and well-nourished.  Abdominal: Soft. There is no abdominal tenderness.  Genitourinary:    Genitourinary Comments: Rectal exam: Protruding, small, early thrombosed external hemorrhoid, no bleeding. DRE performed and no masses. Brown stool without any blood on examiner's glove. Tolerated very well with minimal tenderness.    Vitals reviewed.  BP 138/82 (BP Location: Left Arm, Patient Position: Sitting, Cuff Size: Large)   Pulse 88   Temp (!) 96.9 F (36.1 C) (Skin)   Ht 5\' 4"  (1.626 m)   Wt 208 lb (94.3 kg)   SpO2 97%   BMI 35.70 kg/m  Wt Readings from Last 3 Encounters:  07/26/19 208 lb (94.3 kg)  04/07/19 210 lb 4 oz (95.4 kg)  02/13/19 211 lb (95.7 kg)    Health Maintenance Due  Topic Date Due  . COVID-19 Vaccine (1) Never done  . TETANUS/TDAP  Never done  . PAP SMEAR-Modifier  Never done    There are no preventive care reminders to display for this patient.  Lab Results  Component Value Date   TSH 0.95 01/05/2019   Lab Results  Component Value Date   WBC 10.2 01/05/2019   HGB 12.8 01/05/2019   HCT 38.7 01/05/2019   MCV 85.8 01/05/2019   PLT 309.0 01/05/2019   Lab Results  Component Value Date   NA 140 01/05/2019   K 4.1 01/05/2019   CO2 27 01/05/2019   GLUCOSE 104 (H) 01/05/2019   BUN 16 01/05/2019   CREATININE 0.85 01/05/2019   BILITOT 0.3 01/05/2019   ALKPHOS 68 01/05/2019   AST 18 01/05/2019   ALT 16 01/05/2019   PROT 7.0 01/05/2019   ALBUMIN 4.3 01/05/2019   CALCIUM 9.5 01/05/2019   ANIONGAP 9 10/13/2014   GFR 72.86 01/05/2019   No results found for: CHOL No results found for: HDL No results found for: LDLCALC No results found for: TRIG No results found for: CHOLHDL Lab Results  Component Value Date   HGBA1C 5.7  01/05/2019      Assessment & Plan:   Problem List Items Addressed This Visit      Cardiovascular and Mediastinum   External hemorrhoid, thrombosed - Primary  Please use the Anusol suppositories and take the stool softeners 1-2 times per day. No hard stool is the goal. Sit in warm bathtub and soak twice a day. Eat a high fiber diet. Often, this can be a enough to resolve the issue. See me back for a recheck in 1 week. If getting more painful or larger, call me and I will send you to a general surgeon for removal.   Meds ordered this encounter  Medications  . hydrocortisone (ANUSOL-HC) 25 MG suppository    Sig: Place 1 suppository (25 mg total) rectally 2 (two) times daily.    Dispense:  12 suppository    Refill:  1    Order Specific Question:   Supervising Provider    Answer:   Einar Pheasant [id number]  . docusate sodium (COLACE) 100 MG capsule    Sig: Take 1 capsule (100 mg total) by mouth 2 (two) times daily.    Dispense:  30 capsule    Refill:  0    Order Specific Question:   Supervising Provider    Answer:   Einar Pheasant [id number]    Follow-up: Return in about 1 week (around 08/02/2019).  This visit occurred during the SARS-CoV-2 public health emergency.  Safety protocols were in place, including screening questions prior to the visit, additional usage of staff PPE, and extensive cleaning of exam room while observing appropriate contact time as indicated for disinfecting solutions.    Denice Paradise, NP

## 2019-07-26 NOTE — Patient Instructions (Addendum)
Please use the Anusol suppositories and take the stool softeners 1-2 times per day. No hard stool is the goal. Sit in warm bathtub and soak twice a day. Eat a high fiber diet. Often, this can be a enough to resolve the issue.   See me back for a recheck in 1 week. If getting more painful or larger, call me and I will send you to a general surgeon for removal.    Hemorrhoids Hemorrhoids are swollen veins in and around the rectum or anus. There are two types of hemorrhoids:  Internal hemorrhoids. These occur in the veins that are just inside the rectum. They may poke through to the outside and become irritated and painful.  External hemorrhoids. These occur in the veins that are outside the anus and can be felt as a painful swelling or hard lump near the anus. Most hemorrhoids do not cause serious problems, and they can be managed with home treatments such as diet and lifestyle changes. If home treatments do not help the symptoms, procedures can be done to shrink or remove the hemorrhoids. What are the causes? This condition is caused by increased pressure in the anal area. This pressure may result from various things, including:  Constipation.  Straining to have a bowel movement.  Diarrhea.  Pregnancy.  Obesity.  Sitting for long periods of time.  Heavy lifting or other activity that causes you to strain.  Anal sex.  Riding a bike for a long period of time. What are the signs or symptoms? Symptoms of this condition include:  Pain.  Anal itching or irritation.  Rectal bleeding.  Leakage of stool (feces).  Anal swelling.  One or more lumps around the anus. How is this diagnosed? This condition can often be diagnosed through a visual exam. Other exams or tests may also be done, such as:  An exam that involves feeling the rectal area with a gloved hand (digital rectal exam).  An exam of the anal canal that is done using a small tube (anoscope).  A blood test, if you  have lost a significant amount of blood.  A test to look inside the colon using a flexible tube with a camera on the end (sigmoidoscopy or colonoscopy). How is this treated? This condition can usually be treated at home. However, various procedures may be done if dietary changes, lifestyle changes, and other home treatments do not help your symptoms. These procedures can help make the hemorrhoids smaller or remove them completely. Some of these procedures involve surgery, and others do not. Common procedures include:  Rubber band ligation. Rubber bands are placed at the base of the hemorrhoids to cut off their blood supply.  Sclerotherapy. Medicine is injected into the hemorrhoids to shrink them.  Infrared coagulation. A type of light energy is used to get rid of the hemorrhoids.  Hemorrhoidectomy surgery. The hemorrhoids are surgically removed, and the veins that supply them are tied off.  Stapled hemorrhoidopexy surgery. The surgeon staples the base of the hemorrhoid to the rectal wall. Follow these instructions at home: Eating and drinking   Eat foods that have a lot of fiber in them, such as whole grains, beans, nuts, fruits, and vegetables.  Ask your health care provider about taking products that have added fiber (fiber supplements).  Reduce the amount of fat in your diet. You can do this by eating low-fat dairy products, eating less red meat, and avoiding processed foods.  Drink enough fluid to keep your urine pale yellow.  Managing pain and swelling   Take warm sitz baths for 20 minutes, 3-4 times a day to ease pain and discomfort. You may do this in a bathtub or using a portable sitz bath that fits over the toilet.  If directed, apply ice to the affected area. Using ice packs between sitz baths may be helpful. ? Put ice in a plastic bag. ? Place a towel between your skin and the bag. ? Leave the ice on for 20 minutes, 2-3 times a day. General instructions  Take  over-the-counter and prescription medicines only as told by your health care provider.  Use medicated creams or suppositories as told.  Get regular exercise. Ask your health care provider how much and what kind of exercise is best for you. In general, you should do moderate exercise for at least 30 minutes on most days of the week (150 minutes each week). This can include activities such as walking, biking, or yoga.  Go to the bathroom when you have the urge to have a bowel movement. Do not wait.  Avoid straining to have bowel movements.  Keep the anal area dry and clean. Use wet toilet paper or moist towelettes after a bowel movement.  Do not sit on the toilet for long periods of time. This increases blood pooling and pain.  Keep all follow-up visits as told by your health care provider. This is important. Contact a health care provider if you have:  Increasing pain and swelling that are not controlled by treatment or medicine.  Difficulty having a bowel movement, or you are unable to have a bowel movement.  Pain or inflammation outside the area of the hemorrhoids. Get help right away if you have:  Uncontrolled bleeding from your rectum. Summary  Hemorrhoids are swollen veins in and around the rectum or anus.  Most hemorrhoids can be managed with home treatments such as diet and lifestyle changes.  Taking warm sitz baths can help ease pain and discomfort.  In severe cases, procedures or surgery can be done to shrink or remove the hemorrhoids. This information is not intended to replace advice given to you by your health care provider. Make sure you discuss any questions you have with your health care provider. Document Revised: 08/06/2018 Document Reviewed: 07/30/2017 Elsevier Patient Education  [place].

## 2019-07-27 ENCOUNTER — Telehealth: Payer: Self-pay | Admitting: Family

## 2019-07-27 NOTE — Telephone Encounter (Signed)
Let her know that I will place a referral for her. Continue to use the suppositories and take the stool softener pills by mouth. This takes 1-2 weeks to work- not over night.

## 2019-07-27 NOTE — Telephone Encounter (Signed)
Pt called and said she is not getting any better go ahead and schedule surgery

## 2019-07-27 NOTE — Telephone Encounter (Signed)
Patient aware of below message

## 2019-07-27 NOTE — Telephone Encounter (Signed)
Patient would like referral to surgeon

## 2019-07-29 ENCOUNTER — Other Ambulatory Visit: Payer: Self-pay

## 2019-07-29 ENCOUNTER — Encounter (INDEPENDENT_AMBULATORY_CARE_PROVIDER_SITE_OTHER): Payer: Self-pay | Admitting: Nurse Practitioner

## 2019-08-02 ENCOUNTER — Other Ambulatory Visit: Payer: Self-pay

## 2019-08-02 ENCOUNTER — Encounter: Payer: Self-pay | Admitting: Nurse Practitioner

## 2019-08-02 ENCOUNTER — Ambulatory Visit (INDEPENDENT_AMBULATORY_CARE_PROVIDER_SITE_OTHER): Payer: BC Managed Care – PPO | Admitting: Nurse Practitioner

## 2019-08-02 VITALS — BP 122/80 | HR 80 | Temp 97.9°F | Ht 64.0 in | Wt 209.0 lb

## 2019-08-02 DIAGNOSIS — K645 Perianal venous thrombosis: Secondary | ICD-10-CM | POA: Diagnosis not present

## 2019-08-02 NOTE — Progress Notes (Signed)
Established Patient Office Visit  Subjective:  Patient ID: Nicole Escobar, female    DOB: 1975/10/01  Age: 44 y.o. MRN: [id number]  CC:  Chief Complaint  Patient presents with  . Follow-up    hemorroid    HPI CHRISHONDA LEFTHAND presents for a f/up thrombosed hemorrhoid. - small, improving, still firm and less painful. She has not used any of the suppositories as she could not get it inserted before it melted. She has used the stool softeners and BM are soft and easy to pass. She has soaked in the tub daily. No bleeding. We had discussed surgical referral and she is ready to proceed since she does not think she can treat it with the suppository.   Past Medical History:  Diagnosis Date  . Emphysema of lung ([place])   . Frequent headaches    unknown onset    Past Surgical History:  Procedure Laterality Date  . TONSILECTOMY/ADENOIDECTOMY WITH MYRINGOTOMY Bilateral   . TUBAL LIGATION  2013    Family History  Problem Relation Age of Onset  . Alcohol abuse Mother   . Arthritis Mother   . Cancer Mother        ovary  . Hyperlipidemia Mother   . Hypertension Mother   . Diabetes Mother   . Arthritis Maternal Grandmother   . Hypertension Maternal Grandmother   . Cancer Maternal Grandmother        colon    Social History   Socioeconomic History  . Marital status: Single    Spouse name: Not on file  . Number of children: Not on file  . Years of education: Not on file  . Highest education level: Not on file  Occupational History  . Not on file  Tobacco Use  . Smoking status: Former Smoker    Quit date: 03/25/2003    Years since quitting: 16.3  . Smokeless tobacco: Never Used  Substance and Sexual Activity  . Alcohol use: No    Alcohol/week: 0.0 standard drinks  . 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   . Sexual activity: Yes    Partners: Male  Other Topics Concern  . 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    25 year old daughter at [employer]   36 year old son   Long-term relationship    GED degree    Social Determinants of Health   Financial Resource Strain:   . Difficulty of Paying Living Expenses:   Food Insecurity:   . Worried About Charity fundraiser in the Last Year:   . Arboriculturist in the Last Year:   Transportation Needs:   . Film/video editor (Medical):   Marland Kitchen Lack of Transportation (Non-Medical):   Physical Activity:   . Days of Exercise per Week:   . Minutes of Exercise per Session:   Stress:   . Feeling of Stress :   Social Connections:   . Frequency of Communication with Friends and Family:   . Frequency of Social Gatherings with Friends and Family:   . Attends Religious Services:   . Active Member of Clubs or Organizations:   . Attends Archivist Meetings:   Marland Kitchen Marital Status:   Intimate Partner Violence:   . Fear of Current or Ex-Partner:   . Emotionally Abused:   .  Physically Abused:   . Sexually Abused:     Outpatient Medications Prior to Visit  Medication Sig Dispense Refill  . albuterol (VENTOLIN HFA) 108 (90 Base) MCG/ACT inhaler Inhale 2 puffs into the lungs every 6 (six) hours as needed for wheezing or shortness of breath. 8 g 0  . docusate sodium (COLACE) 100 MG capsule Take 1 capsule (100 mg total) by mouth 2 (two) times daily. 30 capsule 0  . hydrocortisone (ANUSOL-HC) 25 MG suppository Place 1 suppository (25 mg total) rectally 2 (two) times daily. 12 suppository 1   No facility-administered medications prior to visit.    Allergies  Allergen Reactions  . Sulfa Antibiotics Rash    ROS Pertinent positive noted in HPI and otherwise negative.     Objective:    Physical Exam  Genitourinary:    Genitourinary Comments: DRE shows a visible smaller, hard, external hemorrhoid, pink colored.     BP 122/80 (BP Location: Left Arm, Patient Position: Sitting, Cuff Size: Small)   Pulse 80    Temp 97.9 F (36.6 C) (Skin)   Ht 5\' 4"  (1.626 m)   Wt 209 lb (94.8 kg)   SpO2 99%   BMI 35.87 kg/m  Wt Readings from Last 3 Encounters:  08/02/19 209 lb (94.8 kg)  07/26/19 208 lb (94.3 kg)  04/07/19 210 lb 4 oz (95.4 kg)    Health Maintenance Due  Topic Date Due  . COVID-19 Vaccine (1) Never done  . TETANUS/TDAP  Never done  . PAP SMEAR-Modifier  Never done    There are no preventive care reminders to display for this patient.  Lab Results  Component Value Date   TSH 0.95 01/05/2019   Lab Results  Component Value Date   WBC 10.2 01/05/2019   HGB 12.8 01/05/2019   HCT 38.7 01/05/2019   MCV 85.8 01/05/2019   PLT 309.0 01/05/2019   Lab Results  Component Value Date   NA 140 01/05/2019   K 4.1 01/05/2019   CO2 27 01/05/2019   GLUCOSE 104 (H) 01/05/2019   BUN 16 01/05/2019   CREATININE 0.85 01/05/2019   BILITOT 0.3 01/05/2019   ALKPHOS 68 01/05/2019   AST 18 01/05/2019   ALT 16 01/05/2019   PROT 7.0 01/05/2019   ALBUMIN 4.3 01/05/2019   CALCIUM 9.5 01/05/2019   ANIONGAP 9 10/13/2014   GFR 72.86 01/05/2019   No results found for: CHOL No results found for: HDL No results found for: LDLCALC No results found for: TRIG No results found for: CHOLHDL Lab Results  Component Value Date   HGBA1C 5.7 01/05/2019      Assessment & Plan:   Problem List Items Addressed This Visit    None      No orders of the defined types were placed in this encounter.  You have an appointment with Dr. Hervey Ard a local general surgeon on Thursday at 8:30 AM. Please call the office [phone] to confirm and get directions to the clinic. He can examine and treat your external hemorrhoid.   Continue take the stool softener, warm bath soaks, and try wearing a pair of gloves when you insert Anusol suppository to see if it would melt less.  Follow-up with me as needed.  Follow-up: No follow-ups on file.  This visit occurred during the SARS-CoV-2 public health  emergency.  Safety protocols were in place, including screening questions prior to the visit, additional usage of staff PPE, and extensive cleaning of exam room while observing appropriate contact  time as indicated for disinfecting solutions.    Denice Paradise, NP

## 2019-08-02 NOTE — Patient Instructions (Addendum)
You have an appointment with Dr. Hervey Ard a local general surgeon on Thursday at 8:30 AM. Please call the office [phone] to confirm and get directions to the clinic. He can examine and treat your external hemorrhoid.   Continue take the stool softener, warm bath soaks, and try wearing a pair of gloves when you insert Anusol suppository to see if it would melt less.  Follow-up with me as needed.

## 2019-08-04 DIAGNOSIS — K645 Perianal venous thrombosis: Secondary | ICD-10-CM | POA: Diagnosis not present

## 2019-11-03 ENCOUNTER — Telehealth: Payer: Self-pay
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
## 2019-11-03 NOTE — Telephone Encounter (Signed)
Rec urgent care ([facility] [place]) or ED

## 2020-05-22 ENCOUNTER — Telehealth: Payer: Self-pay

## 2020-05-22 NOTE — Telephone Encounter (Signed)
LMTCB to advise patient she needs to be seen ASAP & triage. Most likely needs to be seen today by UC.

## 2020-05-22 NOTE — Telephone Encounter (Signed)
[facility] Night - Cl TELEPHONE ADVICE RECORD [facility] Patient Name: Nicole Escobar Gender: Female DOB: May 08, 1975 Age: 45 Y 48 M 2 D Return Phone Number: [phone] (Primary) Address: City/State/Zip: [place] [address] Client [facility] [place] Night - Cl Client Site [place] Physician AA - PHYSICIAN, Verita Schneiders- MD Contact Type Call Who Is Calling Patient / Member / Family / Caregiver Call Type Triage / Clinical Relationship To Patient Self Return Phone Number [phone] (Primary) Chief Complaint Leg Swelling And Edema Reason for Call Symptomatic / Request for [place] states her 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 Caller states it is becoming uncomfortable and it is above her knee. Translation No Nurse Assessment Nurse: Jerene Bears, RN, Will Date/Time Eilene Ghazi Time): 05/21/2020 5:05:21 PM Confirm and document reason for call. If symptomatic, describe symptoms. ---Caller reports that she has been having swelling of her right leg on the anterior superior aspect of her right knee. Fist sized area of swelling to effected area. Full rom at this time. Does the patient have any new or worsening symptoms? ---Yes Will a triage be completed? ---Yes Related visit to physician within the last 2 weeks? ---No Does the PT have any chronic conditions? (i.e. diabetes, asthma, this includes High risk factors for pregnancy, etc.) ---No Is the patient pregnant or possibly pregnant? (Ask all females between the ages of 75-55) ---No Is this a behavioral health or substance abuse call? ---No Guidelines Guideline Title Affirmed Question Affirmed Notes Nurse Date/Time Eilene Ghazi Time) Knee Swelling [1] MODERATE pain (e.g., interferes with normal activities, limping) AND [2] present > 3 days [place], RN, Will 05/21/2020 5:08:42 PM Disp. Time Eilene Ghazi  Time) Disposition Final User 05/21/2020 5:13:24 PM SEE PCP WITHIN 3 DAYS Yes [place], RN, Will PLEASE NOTE: All timestamps contained within this report are represented as [place] Standard Time. CONFIDENTIALTY NOTICE: This fax transmission is intended only for the addressee. It contains information that is legally privileged, confidential or otherwise protected from use or disclosure. If you are not the intended recipient, you are strictly prohibited from reviewing, disclosing, copying using or disseminating any of this information or taking any action in reliance on or regarding this information. If you have received this fax in error, please notify us immediately by telephone so that we can arrange for its return to [place]. Phone: [phone], Toll-Free: [phone], Fax: [phone] Page: 2 of 2 Call Id: [id number] [place] Disagree/Comply Comply Caller Understands Yes PreDisposition Call Doctor Care Advice Given Per Guideline SEE PCP WITHIN 3 DAYS: * You need to be seen within 2 or 3 days. REST YOUR KNEE FOR THE NEXT COUPLE DAYS: * Avoid activities that cause any pain. * Reduce activities that put a lot of strain on the knee joint (e.g., deep knee bends, stair climbing, running). PAIN MEDICINES: * For pain relief, you can take either acetaminophen, ibuprofen, or naproxen. * IBUPROFEN (E.G., MOTRIN, ADVIL): Take 400 mg (two 200 mg pills) by mouth every 6 hours. The most you should take each day is 1,200 mg (six 200 mg pills), unless your doctor has told you to take more. PAIN MEDICINES - IBUPROFEN FOR SWELLING: * Ibuprofen (e.g., Advil, Motrin) is an anti-inflammatory. It may help reduce the swelling. * It may be a better pain medicine choice than acetaminophen (e.g., Tylenol) in this situation if you can take this type of medicine. CALL BACK IF: CARE ADVICE given per Knee Joint Swelling (  Adult) guideline. * You become worse * You develop a fever * Knee becomes red or very  painful Referrals REFERRED TO PCP OFFICE

## 2020-05-22 NOTE — Telephone Encounter (Signed)
Call pt Triage note confusing right leg swelling needs to be evaluated at [facility] or [facility] as she may need ultrasound to rule out DVT or antibiotics if skin infection Please advise she go to UC today unless we have appt here

## 2020-05-24 NOTE — Telephone Encounter (Signed)
LM once again for patient to call back.  

## 2020-06-30 ENCOUNTER — Emergency Department
Admission: EM | Admit: 2020-06-30 | Discharge: 2020-06-30 | Disposition: A | Discharge status: Home / Self Care | Payer: BC Managed Care – PPO | Attending: Emergency Medicine | Admitting: Emergency Medicine

## 2020-06-30 DIAGNOSIS — R1032 Left lower quadrant pain: Secondary | ICD-10-CM | POA: Diagnosis not present

## 2020-06-30 DIAGNOSIS — R030 Elevated blood-pressure reading, without diagnosis of hypertension: Secondary | ICD-10-CM | POA: Diagnosis not present

## 2020-06-30 DIAGNOSIS — Z87891 Personal history of nicotine dependence: Secondary | ICD-10-CM | POA: Insufficient documentation

## 2020-06-30 DIAGNOSIS — R109 Unspecified abdominal pain: Secondary | ICD-10-CM | POA: Diagnosis not present

## 2020-06-30 LAB — URINALYSIS, COMPLETE (UACMP) WITH MICROSCOPIC
Bilirubin Urine: NEGATIVE
Glucose, UA: NEGATIVE mg/dL
Ketones, ur: NEGATIVE mg/dL
Nitrite: NEGATIVE
Protein, ur: NEGATIVE mg/dL
Specific Gravity, Urine: 1.006 (ref 1.005–1.030)
pH: 5 (ref 5.0–8.0)

## 2020-06-30 LAB — COMPREHENSIVE METABOLIC PANEL
ALT: 14 U/L (ref 0–44)
AST: 19 U/L (ref 15–41)
Albumin: 3.8 g/dL (ref 3.5–5.0)
Alkaline Phosphatase: 51 U/L (ref 38–126)
Anion gap: 9 (ref 5–15)
BUN: 9 mg/dL (ref 6–20)
CO2: 22 mmol/L (ref 22–32)
Calcium: 8.9 mg/dL (ref 8.9–10.3)
Chloride: 104 mmol/L (ref 98–111)
Creatinine, Ser: 0.77 mg/dL (ref 0.44–1.00)
GFR, Estimated: >60 mL/min (ref >60)
Glucose, Bld: 126 mg/dL — ABNORMAL HIGH (ref 70–99)
Potassium: 3.5 mmol/L (ref 3.5–5.1)
Sodium: 135 mmol/L (ref 135–145)
Total Bilirubin: 0.6 mg/dL (ref 0.3–1.2)
Total Protein: 7 g/dL (ref 6.5–8.1)

## 2020-06-30 LAB — CBC
HCT: 40.9 % (ref 36.0–46.0)
Hemoglobin: 13.7 g/dL (ref 12.0–15.0)
MCH: 28.5 pg (ref 26.0–34.0)
MCHC: 33.5 g/dL (ref 30.0–36.0)
MCV: 85 fL (ref 80.0–100.0)
Platelets: 317 10*3/uL (ref 150–400)
RBC: 4.81 MIL/uL (ref 3.87–5.11)
RDW: 12.7 % (ref 11.5–15.5)
WBC: 8.2 10*3/uL (ref 4.0–10.5)
nRBC: 0 % (ref 0.0–0.2)

## 2020-06-30 LAB — LIPASE, BLOOD: Lipase: 29 U/L (ref 11–51)

## 2020-06-30 MED ORDER — DICYCLOMINE HCL 10 MG PO CAPS
20.0000 mg | ORAL_CAPSULE | Freq: Once | ORAL | Status: AC
  Administered 2020-06-30: 20 mg via ORAL
  Filled 2020-06-30: qty 2

## 2020-06-30 NOTE — ED Provider Notes (Signed)
[facility] Emergency Department Provider Note  ____________________________________________   I have reviewed the triage vital signs and the nursing notes.   HISTORY  Chief Complaint Abdominal Pain   History limited by: Not Limited   HPI Nicole Escobar is a 45 y.o. female who presents to the emergency department today because of abdominal pain. Pain is located in the left lower quadrant and flank. She states it has been present for the past two days. It has been getting slightly worse. She has had similar cramping in the past with her menstrel cycle but it usually feels lower to her. She has not had any vomiting. Denies any change in stool, no diarrhea, no bloody stool.    Records reviewed. Per medical record review patient has a history of abdominal pain.  Past Medical History:  Diagnosis Date  . Emphysema of lung ([place])   . Frequent headaches    unknown onset    Patient Active Problem List   Diagnosis Date Noted  . External hemorrhoid, thrombosed 07/26/2019  . GAD (generalized anxiety disorder) 02/08/2018  . Lower abdominal pain 02/08/2018  . Boil 02/08/2018  . Elevated blood pressure reading 02/08/2018  . Chest pain 02/08/2018  . Left ear pain 11/18/2016  . History of cocaine abuse ([place]) 04/03/2016  . Influenza-like illness 05/14/2015  . Benign paroxysmal positional vertigo 10/17/2014  . Hand joint pain 05/16/2014  . Bilateral carpal tunnel syndrome 05/16/2014  . Stress headaches 03/16/2014    Past Surgical History:  Procedure Laterality Date  . TONSILECTOMY/ADENOIDECTOMY WITH MYRINGOTOMY Bilateral   . TUBAL LIGATION  2013    Prior to Admission medications   Medication Sig Start Date End Date Taking? Authorizing Provider  albuterol (VENTOLIN HFA) 108 (90 Base) MCG/ACT inhaler Inhale 2 puffs into the lungs every 6 (six) hours as needed for wheezing or shortness of breath. 04/07/19   Copland, Frederico Hamman, MD  docusate sodium (COLACE) 100 MG  capsule Take 1 capsule (100 mg total) by mouth 2 (two) times daily. 07/26/19   Marval Regal, NP  hydrocortisone (ANUSOL-HC) 25 MG suppository Place 1 suppository (25 mg total) rectally 2 (two) times daily. 07/26/19   Marval Regal, NP    Allergies Sulfa antibiotics  Family History  Problem Relation Age of Onset  . Alcohol abuse Mother   . Arthritis Mother   . Cancer Mother        ovary  . Hyperlipidemia Mother   . Hypertension Mother   . Diabetes Mother   . Arthritis Maternal Grandmother   . Hypertension Maternal Grandmother   . Cancer Maternal Grandmother        colon    Social History Social History   Tobacco Use  . Smoking status: Former Smoker    Quit date: 03/25/2003    Years since quitting: 17.2  . Smokeless tobacco: Never Used  Substance Use Topics  . Alcohol use: No    Alcohol/week: 0.0 standard drinks  . 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     Review of Systems Constitutional: No fever/chills Eyes: No visual changes. ENT: No sore throat. Cardiovascular: Denies chest pain. Respiratory: Denies shortness of breath. Gastrointestinal:Positive for abdominal pain.  No nausea, no vomiting.  No diarrhea.   Genitourinary: Negative for dysuria. Musculoskeletal: Negative for back pain. Skin: Negative for rash. Neurological: Negative for headaches, focal weakness or numbness.  ____________________________________________   PHYSICAL  EXAM:  VITAL SIGNS: ED Triage Vitals  Enc Vitals Group     BP 06/30/20 1529 (!) 178/90     Pulse Rate 06/30/20 1529 74     Resp 06/30/20 1529 14     Temp 06/30/20 1529 98.4 F (36.9 C)     Temp Source 06/30/20 1529 Oral     SpO2 06/30/20 1529 98 %     Weight --      Height --      Head Circumference --      Peak Flow --      Pain Score 06/30/20 1530 5   Constitutional: Alert and oriented.  Eyes: Conjunctivae are normal.  ENT      Head: Normocephalic and  atraumatic.      Nose: No congestion/rhinnorhea.      Mouth/Throat: Mucous membranes are moist.      Neck: No stridor. Hematological/Lymphatic/Immunilogical: No cervical lymphadenopathy. Cardiovascular: Normal rate, regular rhythm.  No murmurs, rubs, or gallops.  Respiratory: Normal respiratory effort without tachypnea nor retractions. Breath sounds are clear and equal bilaterally. No wheezes/rales/rhonchi. Gastrointestinal: Soft and non tender. No rebound. No guarding.  Genitourinary: Deferred Musculoskeletal: Normal range of motion in all extremities. No lower extremity edema. Neurologic:  Normal speech and language. No gross focal neurologic deficits are appreciated.  Skin:  Skin is warm, dry and intact. No rash noted. Psychiatric: Mood and affect are normal. Speech and behavior are normal. Patient exhibits appropriate insight and judgment.  ____________________________________________    LABS (pertinent positives/negatives)  CBC wbc 8.2, hgb 13.7, plt 317 Lipase 29 CMP wnl except glu 126 ____________________________________________   EKG  None  ____________________________________________    RADIOLOGY  None ____________________________________________   PROCEDURES  Procedures  ____________________________________________   INITIAL IMPRESSION / ASSESSMENT AND PLAN / ED COURSE  Pertinent labs & imaging results that were available during my care of the patient were reviewed by me and considered in my medical decision making (see chart for details).   Patient presented to the emergency department today because of concern for abdominal pain. On exam she did have some tenderness to palpation of the left side. Blood work was obtained which did not show any concerning leukocytosis or other abnormality. The patient urine without concerning findings for infection. Did discuss obtaining ct scan with the patient to further evaluate her pain. Patient felt comfortable deferring  at this time which I think is reasonable given lack of fever or leukocytosis. Did discuss strict return precautions.  ____________________________________________   FINAL CLINICAL IMPRESSION(S) / ED DIAGNOSES  Final diagnoses:  Abdominal pain, unspecified abdominal location     Note: This dictation was prepared with Dragon dictation. Any transcriptional errors that result from this process are unintentional     Nance Pear, MD 06/30/20 1729

## 2020-06-30 NOTE — ED Triage Notes (Signed)
Pt presents via POC c/o LLQ abd pain. Reports intermittent cramping of abd for past several weeks. Reports abnormal period and abd cramping possible related to "menopause". Pt denies vomiting or diarrhea. + nausea.

## 2020-06-30 NOTE — Discharge Instructions (Addendum)
Please seek medical attention for any high fevers, chest pain, shortness of breath, change in behavior, persistent vomiting, bloody stool or any other new or concerning symptoms.  

## 2020-08-24 ENCOUNTER — Telehealth: Payer: Self-pay | Admitting: Family

## 2020-08-24 NOTE — Telephone Encounter (Signed)
FYI

## 2020-08-24 NOTE — Telephone Encounter (Signed)
PT called to advise that her right knee has swelling above it and wants to be seen. Advise Arnett had nothing till July 15 and got her with michelle flinchum on June 7th.

## 2020-08-24 NOTE — Telephone Encounter (Signed)
Called patient to give provider advice. Patient states she has to work so she wants to keep her visit on 6/7.

## 2020-08-25 ENCOUNTER — Other Ambulatory Visit: Payer: Self-pay

## 2020-08-25 ENCOUNTER — Emergency Department: Payer: BC Managed Care – PPO

## 2020-08-25 ENCOUNTER — Emergency Department
Admission: EM | Admit: 2020-08-25 | Discharge: 2020-08-25 | Disposition: A | Discharge status: Home / Self Care | Payer: BC Managed Care – PPO | Attending: Emergency Medicine | Admitting: Emergency Medicine

## 2020-08-25 ENCOUNTER — Encounter: Payer: Self-pay | Admitting: Emergency Medicine

## 2020-08-25 DIAGNOSIS — M7989 Other specified soft tissue disorders: Secondary | ICD-10-CM | POA: Insufficient documentation

## 2020-08-25 DIAGNOSIS — Z5321 Procedure and treatment not carried out due to patient leaving prior to being seen by health care provider: Secondary | ICD-10-CM | POA: Insufficient documentation

## 2020-08-25 DIAGNOSIS — M79604 Pain in right leg: Secondary | ICD-10-CM | POA: Insufficient documentation

## 2020-08-25 IMAGING — US US EXTREM LOW VENOUS*R*
1 series · 14 of 24 positions shown · non-contrast
Comparison: Right lower extremity Doppler [DATE].

CLINICAL DATA: 45-year-old female with right leg pain.

EXAM:
RIGHT LOWER EXTREMITY VENOUS DOPPLER ULTRASOUND
TECHNIQUE: Gray-scale sonography with compression, as well as color and duplex
ultrasound, were performed to evaluate the deep venous system(s)
from the level of the common femoral vein through the popliteal and
proximal calf veins.

[Series 1001: us venous img lower uni right (dvt) · portal-venous · 14 of 34 slices shown]
[im 1/34]
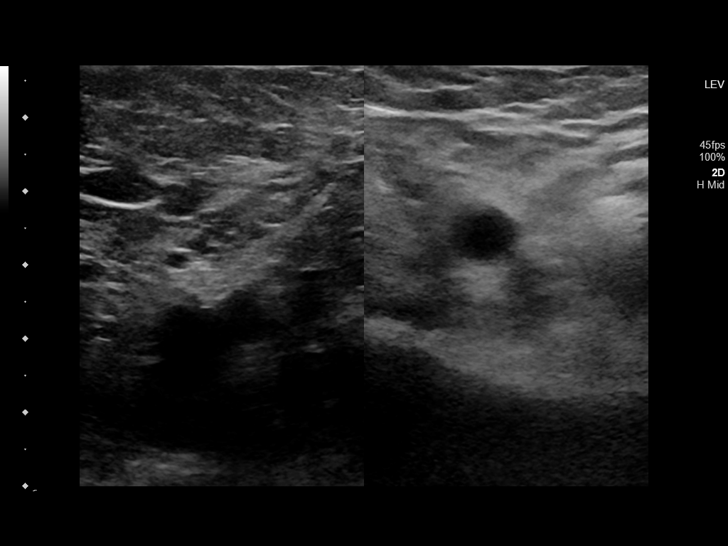
[im 3/34]
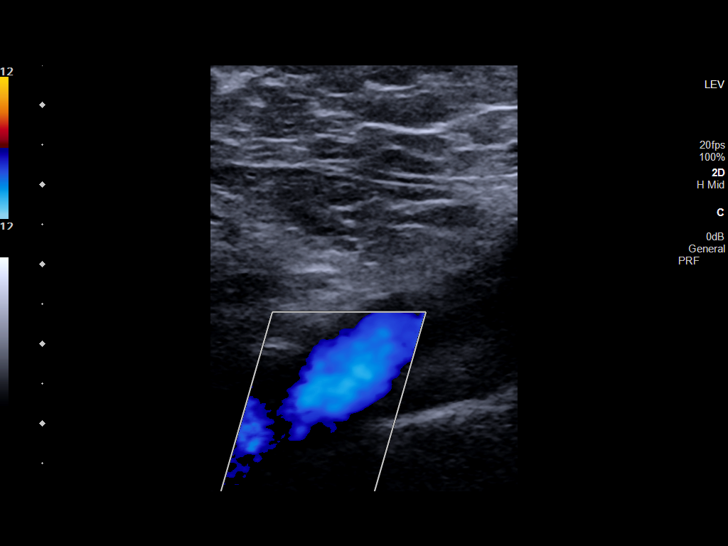
[im 6/34]
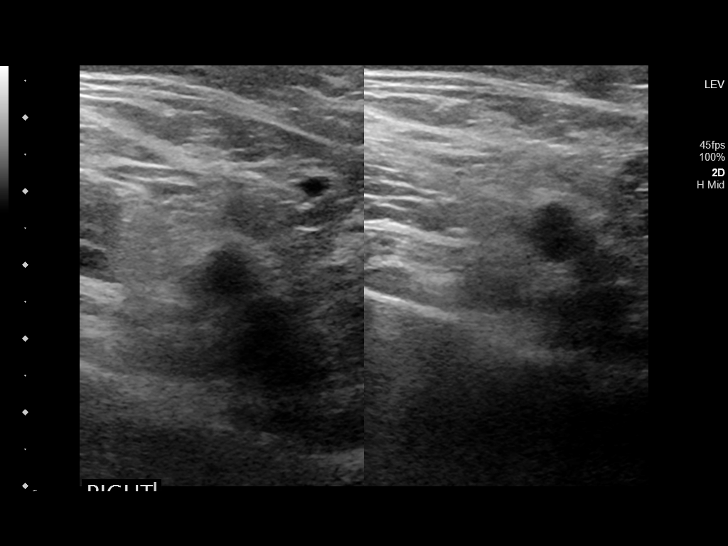
[im 9/34]
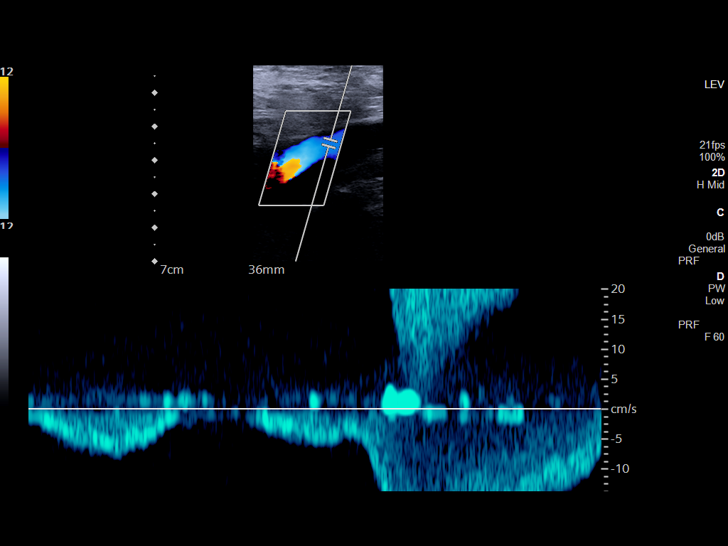
[im 11/34]
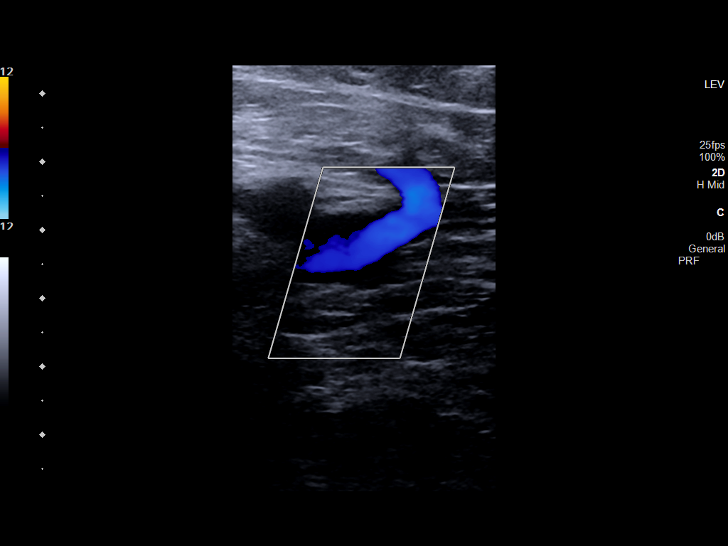
[im 13/34]
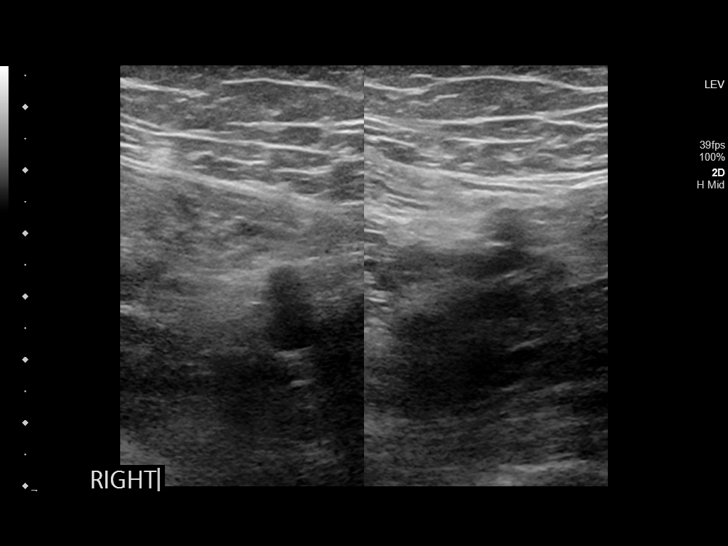
[im 16/34]
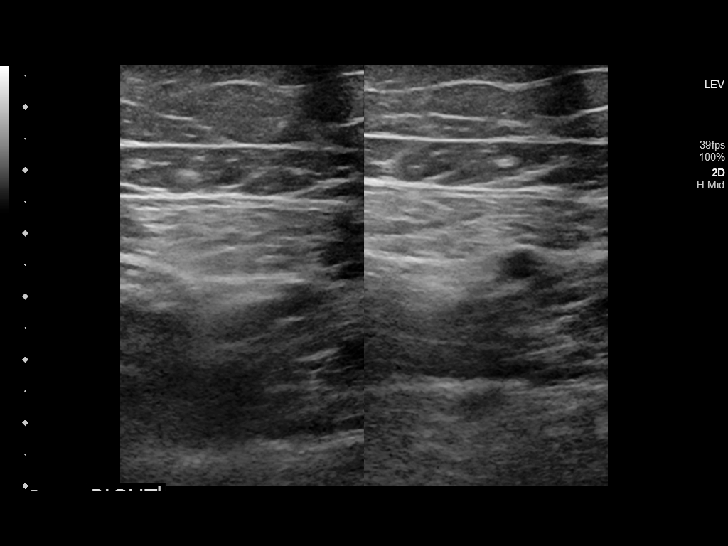
[im 18/34]
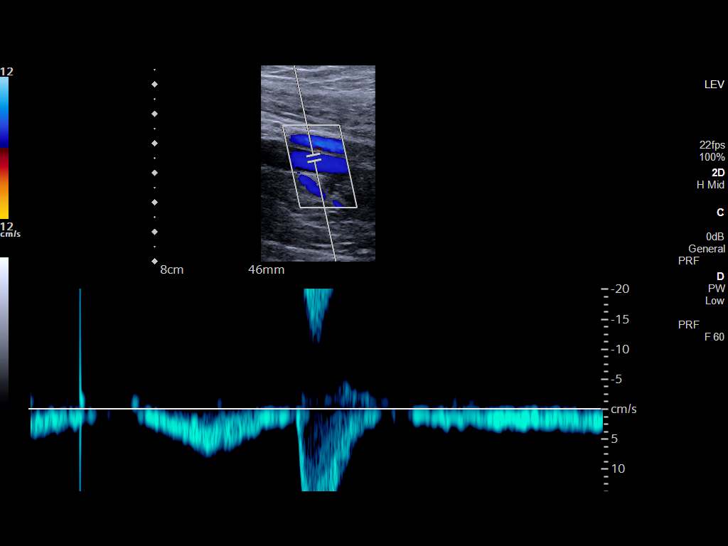
[im 21/34]
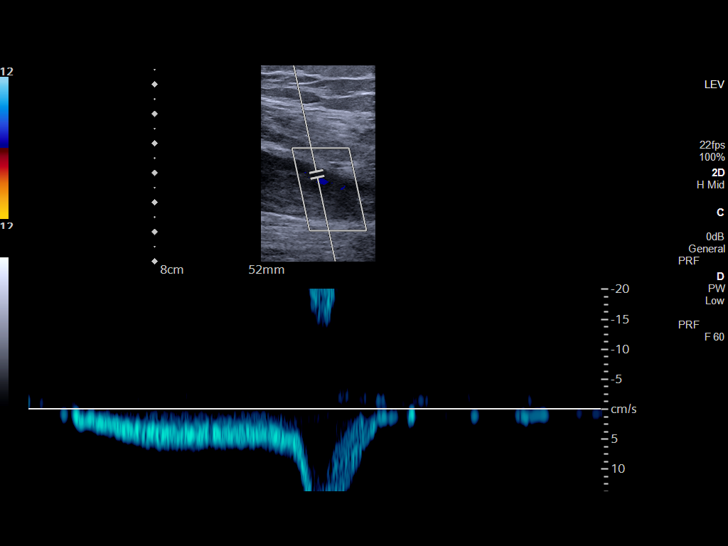
[im 23/34]
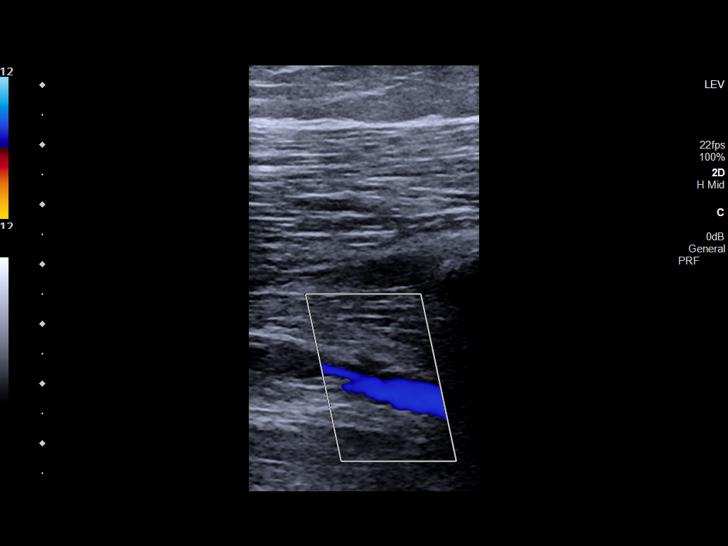
[im 26/34]
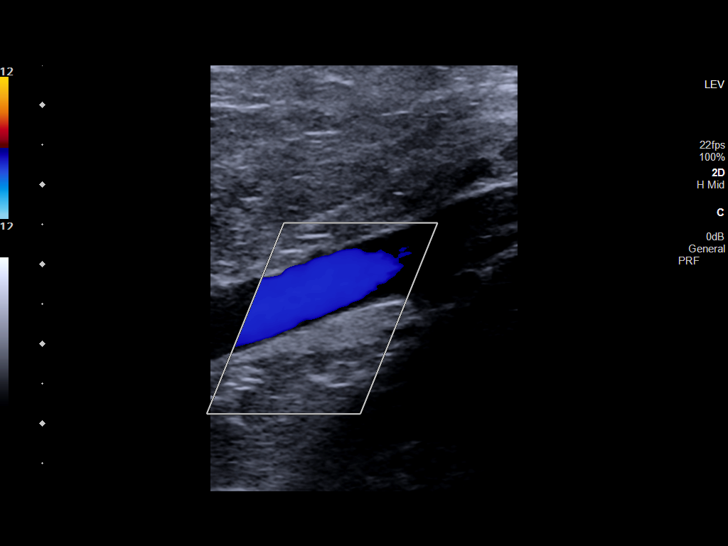
[im 28/34]
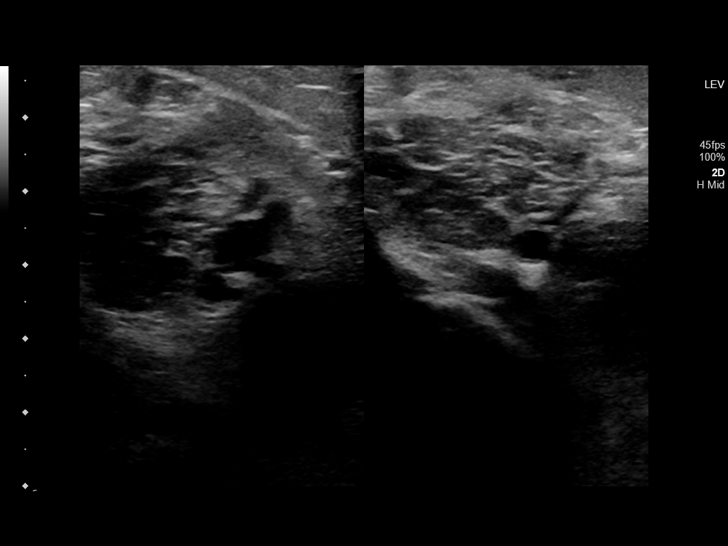
[im 31/34]
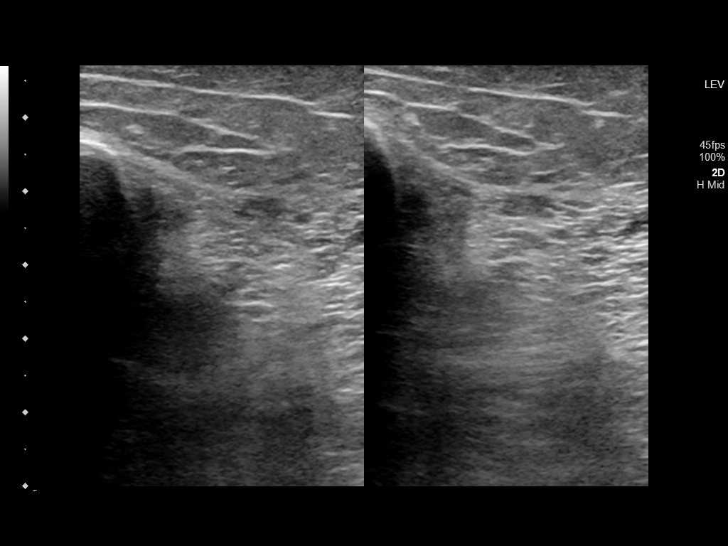
[im 34/34]
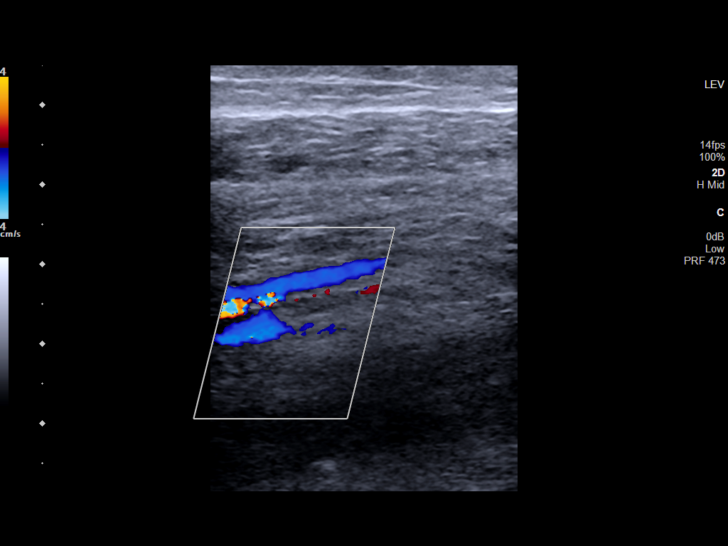

[14 of 24 positions shown; findings below may reference images not displayed]

FINDINGS: VENOUS

Normal compressibility of the common femoral, superficial femoral,
and popliteal veins, as well as the visualized calf veins.
Visualized portions of profunda femoral vein and great saphenous
vein unremarkable. No filling defects to suggest DVT on grayscale or
color Doppler imaging. Doppler waveforms show normal direction of
venous flow, normal respiratory plasticity and response to
augmentation.

Limited views of the contralateral common femoral vein are
unremarkable.

OTHER

None.

Limitations: none
IMPRESSION: No evidence of right lower extremity deep venous thrombosis.

## 2020-08-25 NOTE — ED Notes (Signed)
Pt called x's 3, no response ?

## 2020-08-25 NOTE — ED Triage Notes (Signed)
Pt reports that her right leg is hurting her, she has a PMD appointment on Tuesday for the pain, they called her and told her that she needs to be seen by urgent care or ER before she comes to the office visit. She reports that her right leg is aching and swollen. She reports they are concerned that it may be a blood clot. Her right knee is more swollen but not red or warm to touch. 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

## 2020-08-25 NOTE — ED Notes (Signed)
This tech attempted to call pt phone number at [place]. No answer.

## 2020-08-27 NOTE — Telephone Encounter (Signed)
noted 

## 2020-08-28 ENCOUNTER — Ambulatory Visit
Admission: RE | Admit: 2020-08-28 | Discharge: 2020-08-28 | Disposition: A | Discharge status: Home / Self Care | Payer: BC Managed Care – PPO | Source: Ambulatory Visit | Attending: Adult Health | Admitting: Adult Health

## 2020-08-28 ENCOUNTER — Ambulatory Visit
Admission: RE | Admit: 2020-08-28 | Discharge: 2020-08-28 | Disposition: A | Discharge status: Home / Self Care | Payer: BC Managed Care – PPO | Attending: Adult Health | Admitting: Adult Health

## 2020-08-28 ENCOUNTER — Other Ambulatory Visit: Payer: Self-pay

## 2020-08-28 ENCOUNTER — Encounter: Payer: Self-pay | Admitting: Adult Health

## 2020-08-28 ENCOUNTER — Ambulatory Visit (INDEPENDENT_AMBULATORY_CARE_PROVIDER_SITE_OTHER): Payer: BC Managed Care – PPO | Admitting: Adult Health

## 2020-08-28 ENCOUNTER — Telehealth: Payer: Self-pay | Admitting: Family

## 2020-08-28 VITALS — BP 158/92 | HR 76 | Temp 97.6°F | Ht 64.02 in | Wt 203.0 lb

## 2020-08-28 DIAGNOSIS — M25562 Pain in left knee: Secondary | ICD-10-CM | POA: Insufficient documentation

## 2020-08-28 DIAGNOSIS — M25561 Pain in right knee: Secondary | ICD-10-CM | POA: Insufficient documentation

## 2020-08-28 DIAGNOSIS — M2550 Pain in unspecified joint: Secondary | ICD-10-CM | POA: Diagnosis not present

## 2020-08-28 DIAGNOSIS — M1712 Unilateral primary osteoarthritis, left knee: Secondary | ICD-10-CM | POA: Diagnosis not present

## 2020-08-28 DIAGNOSIS — M25461 Effusion, right knee: Secondary | ICD-10-CM | POA: Diagnosis not present

## 2020-08-28 LAB — CBC WITH DIFFERENTIAL/PLATELET
Basophils Absolute: 0.1 10*3/uL (ref 0.0–0.1)
Basophils Relative: 0.6 % (ref 0.0–3.0)
Eosinophils Absolute: 0.2 10*3/uL (ref 0.0–0.7)
Eosinophils Relative: 2.3 % (ref 0.0–5.0)
HCT: 37.7 % (ref 36.0–46.0)
Hemoglobin: 13 g/dL (ref 12.0–15.0)
Lymphocytes Relative: 30.4 % (ref 12.0–46.0)
Lymphs Abs: 2.6 10*3/uL (ref 0.7–4.0)
MCHC: 34.6 g/dL (ref 30.0–36.0)
MCV: 83.2 fl (ref 78.0–100.0)
Monocytes Absolute: 0.6 10*3/uL (ref 0.1–1.0)
Monocytes Relative: 6.4 % (ref 3.0–12.0)
Neutro Abs: 5.2 10*3/uL (ref 1.4–7.7)
Neutrophils Relative %: 60.3 % (ref 43.0–77.0)
Platelets: 285 10*3/uL (ref 150.0–400.0)
RBC: 4.53 Mil/uL (ref 3.87–5.11)
RDW: 13 % (ref 11.5–15.5)
WBC: 8.6 10*3/uL (ref 4.0–10.5)

## 2020-08-28 LAB — TSH: TSH: 2.36 u[IU]/mL (ref 0.35–4.50)

## 2020-08-28 LAB — COMPREHENSIVE METABOLIC PANEL
ALT: 11 U/L (ref 0–35)
AST: 13 U/L (ref 0–37)
Albumin: 4.2 g/dL (ref 3.5–5.2)
Alkaline Phosphatase: 60 U/L (ref 39–117)
BUN: 14 mg/dL (ref 6–23)
CO2: 23 mEq/L (ref 19–32)
Calcium: 9.2 mg/dL (ref 8.4–10.5)
Chloride: 104 mEq/L (ref 96–112)
Creatinine, Ser: 0.77 mg/dL (ref 0.40–1.20)
GFR: 93.41 mL/min (ref >60.00)
Glucose, Bld: 109 mg/dL — ABNORMAL HIGH (ref 70–99)
Potassium: 3.8 mEq/L (ref 3.5–5.1)
Sodium: 137 mEq/L (ref 135–145)
Total Bilirubin: 0.3 mg/dL (ref 0.2–1.2)
Total Protein: 6.9 g/dL (ref 6.0–8.3)

## 2020-08-28 LAB — B12 AND FOLATE PANEL
Folate: 7.9 ng/mL (ref >5.9)
Vitamin B-12: 182 pg/mL — ABNORMAL LOW (ref 211–911)

## 2020-08-28 IMAGING — CR DG KNEE COMPLETE 4+V*R*
1 series · 5 of 5 positions shown · non-contrast
Comparison: None.

CLINICAL DATA: Bilateral anterior knee pain and swelling for 3
weeks, right worse than left

EXAM:
RIGHT KNEE - COMPLETE 4+ VIEW; LEFT KNEE - COMPLETE 4+ VIEW

[Series 1: dg knee complete 4 views right · 0.14mm/px · 5 of 5 slices shown]
[im 1/5]
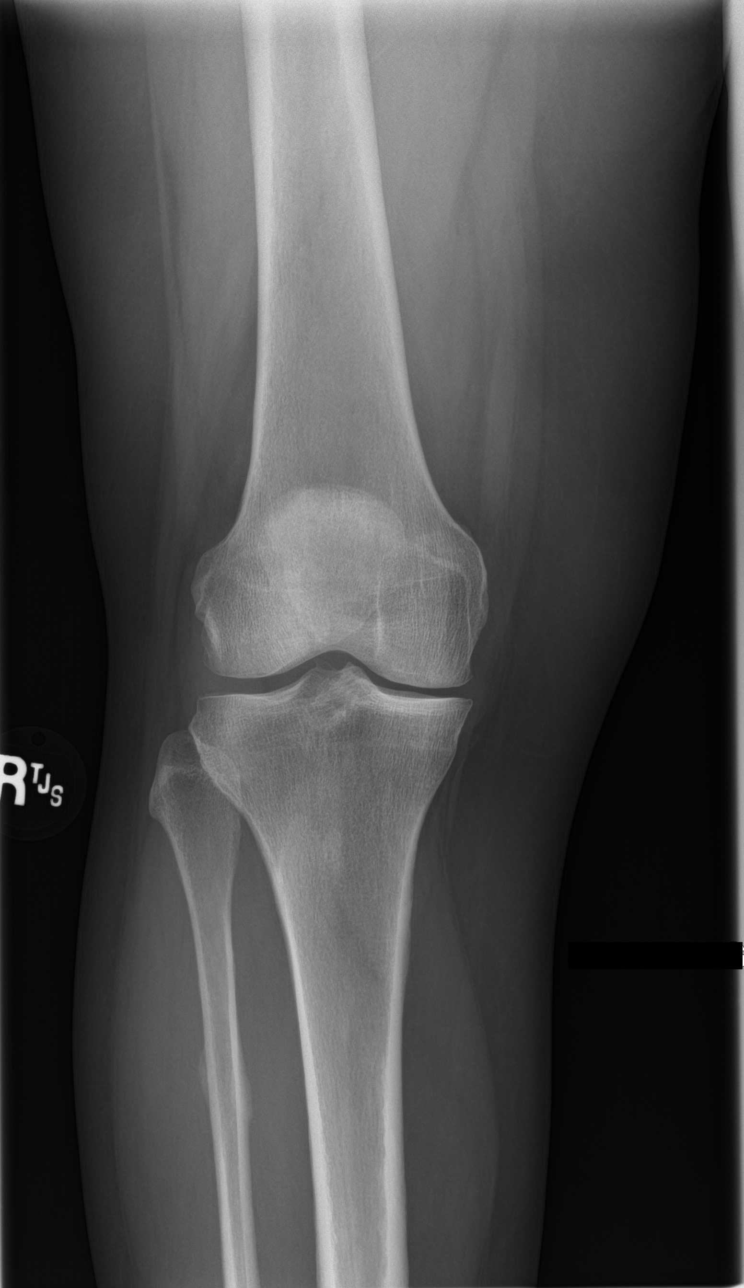
[im 2/5]
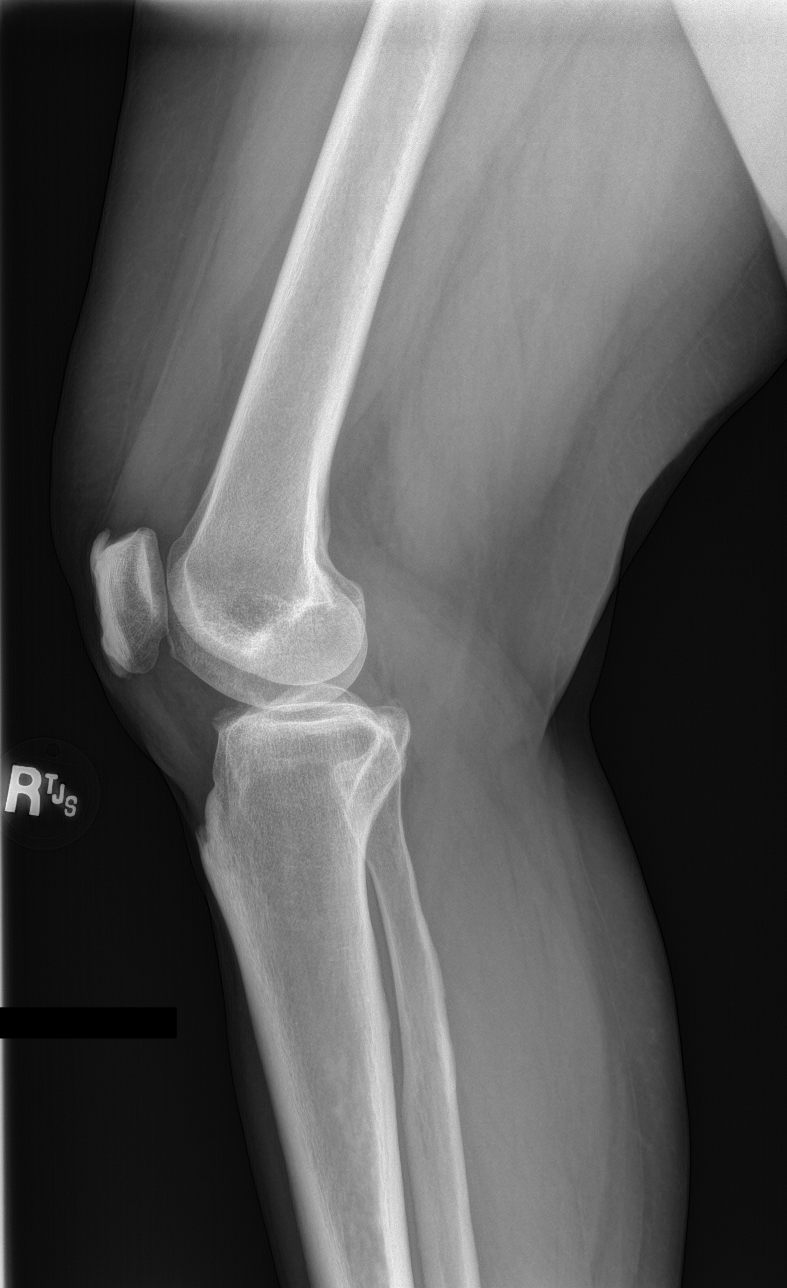
[im 3/5]
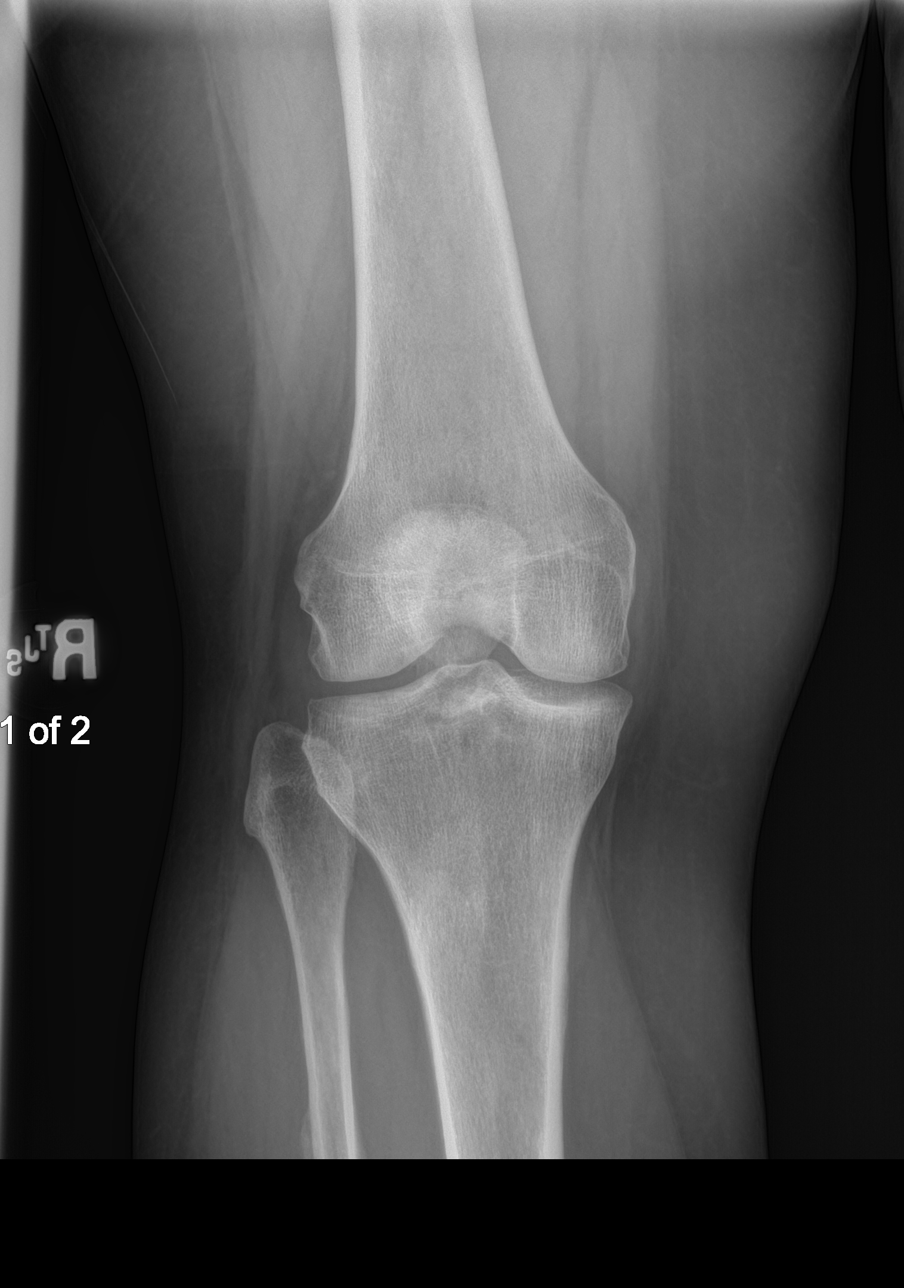
[im 4/5]
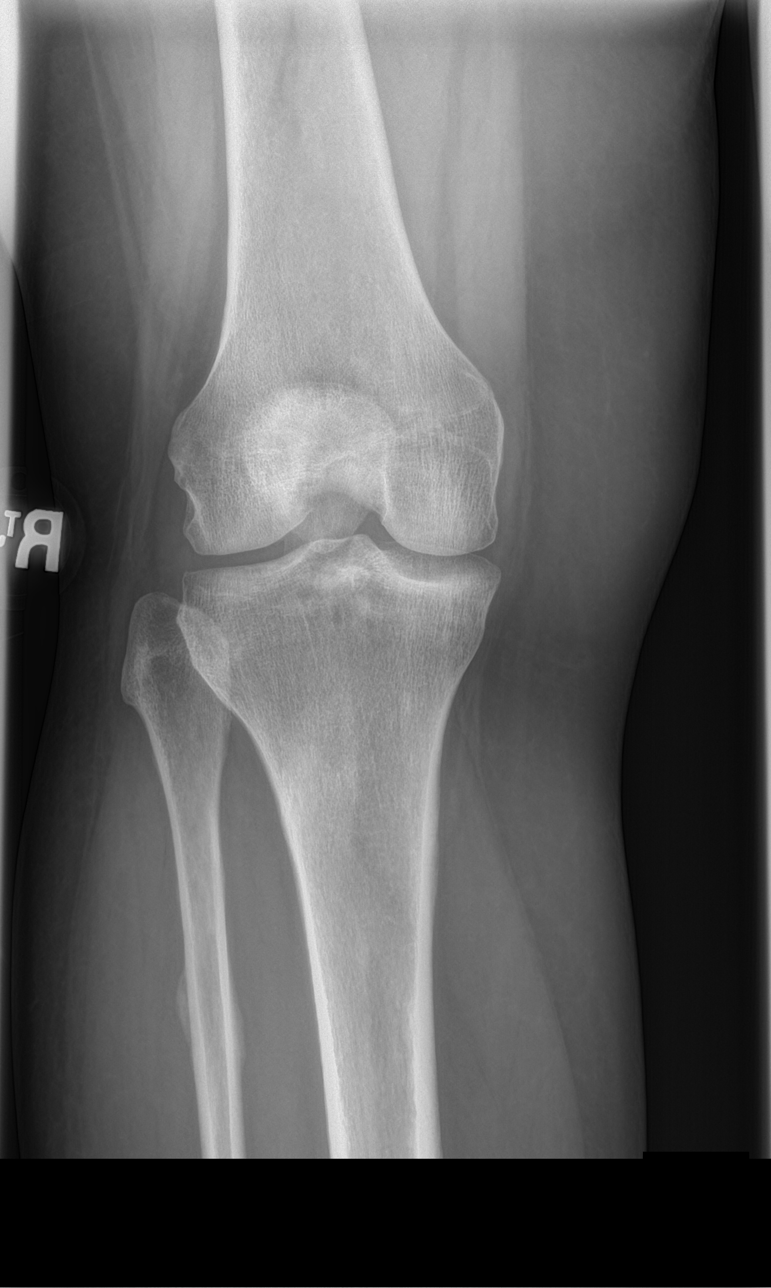
[im 5/5]
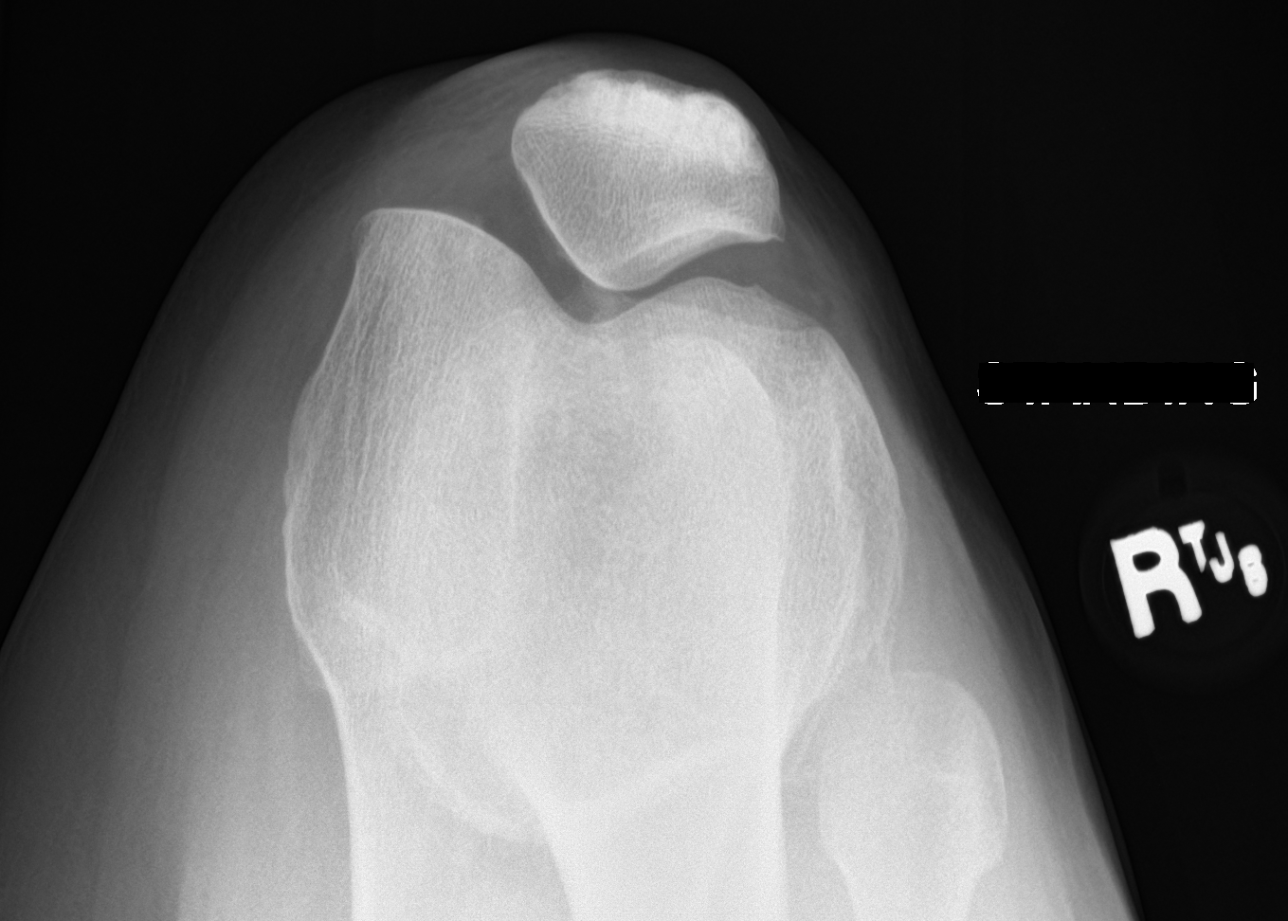

[5 of 5 positions shown; findings below may reference images not displayed]

FINDINGS: No acute fracture or malalignment. Mild medial compartment joint
space narrowing of the bilateral knees, right worse than left. Small
enthesophytes at the bilateral quadriceps tendon insertions. Small
right knee joint effusion. There may be a trace left knee joint
effusion. Small amount of smooth bony callus noted within the
proximal right fibular diaphysis, likely reflecting sequela of
remote trauma. Soft tissues appear within normal limits.
IMPRESSION: 1. Mild medial compartment joint space narrowing of the bilateral
knees, right worse than left.
2. Small right knee joint effusion.

## 2020-08-28 IMAGING — CR DG KNEE COMPLETE 4+V*L*
1 series · 4 of 4 positions shown · non-contrast
Comparison: None.

CLINICAL DATA: Bilateral anterior knee pain and swelling for 3
weeks, right worse than left

EXAM:
RIGHT KNEE - COMPLETE 4+ VIEW; LEFT KNEE - COMPLETE 4+ VIEW

[Series 1: dg knee complete 4 views left · 0.14mm/px · 4 of 4 slices shown]
[im 1/4]
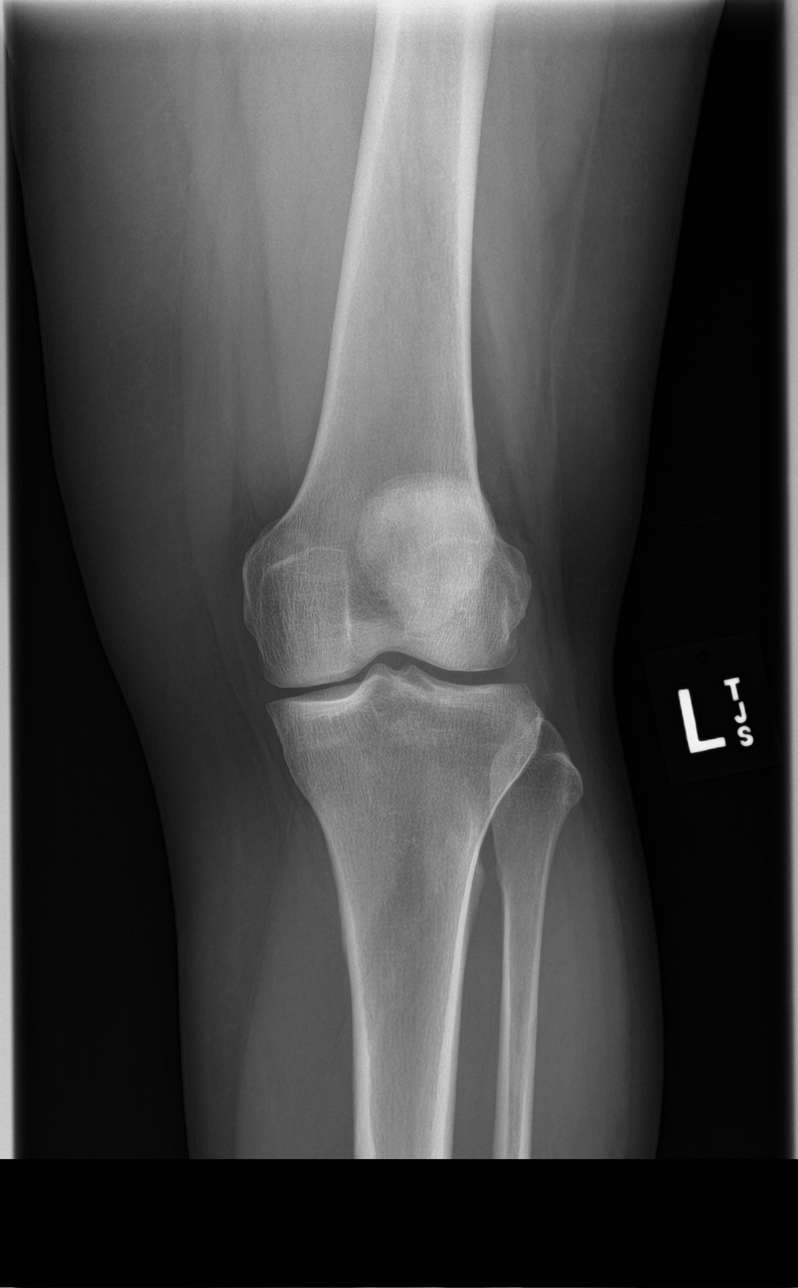
[im 2/4]
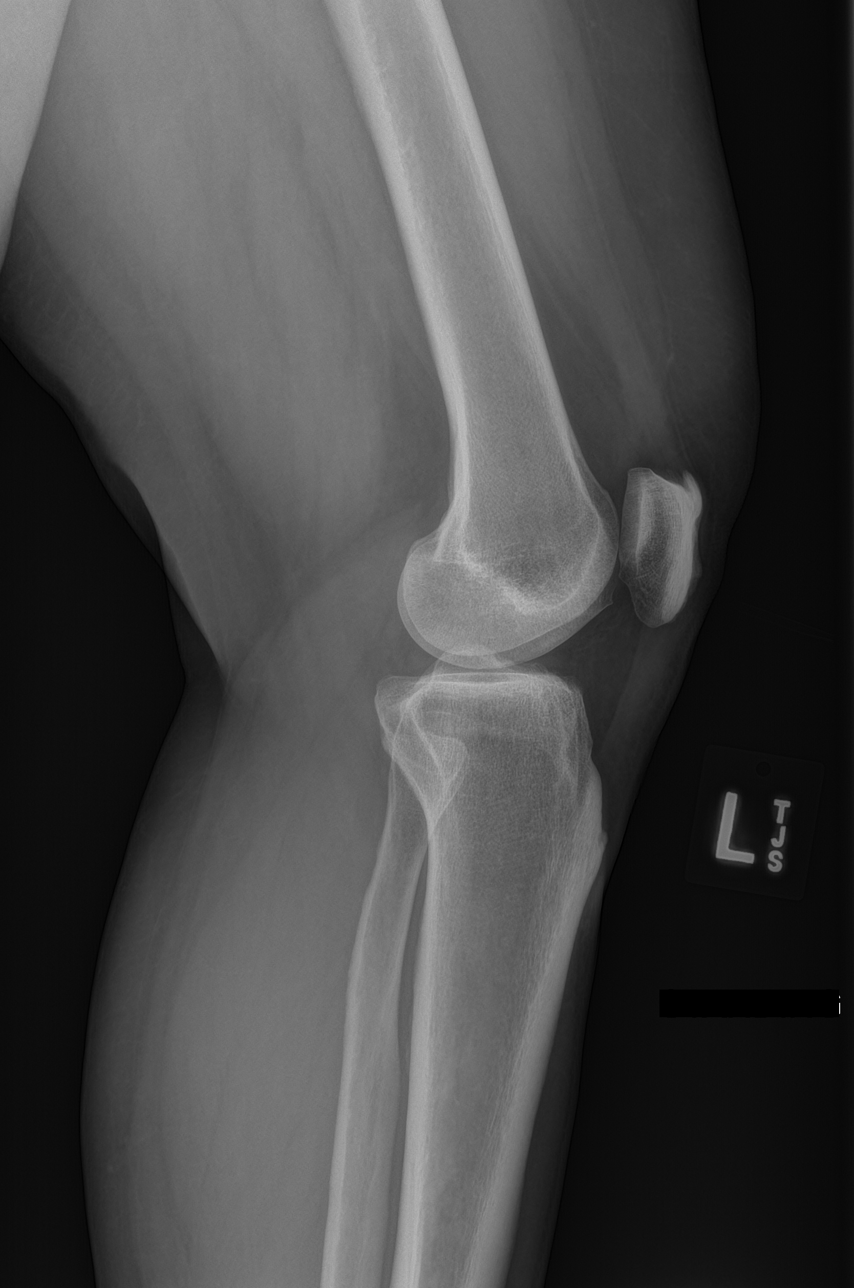
[im 3/4]
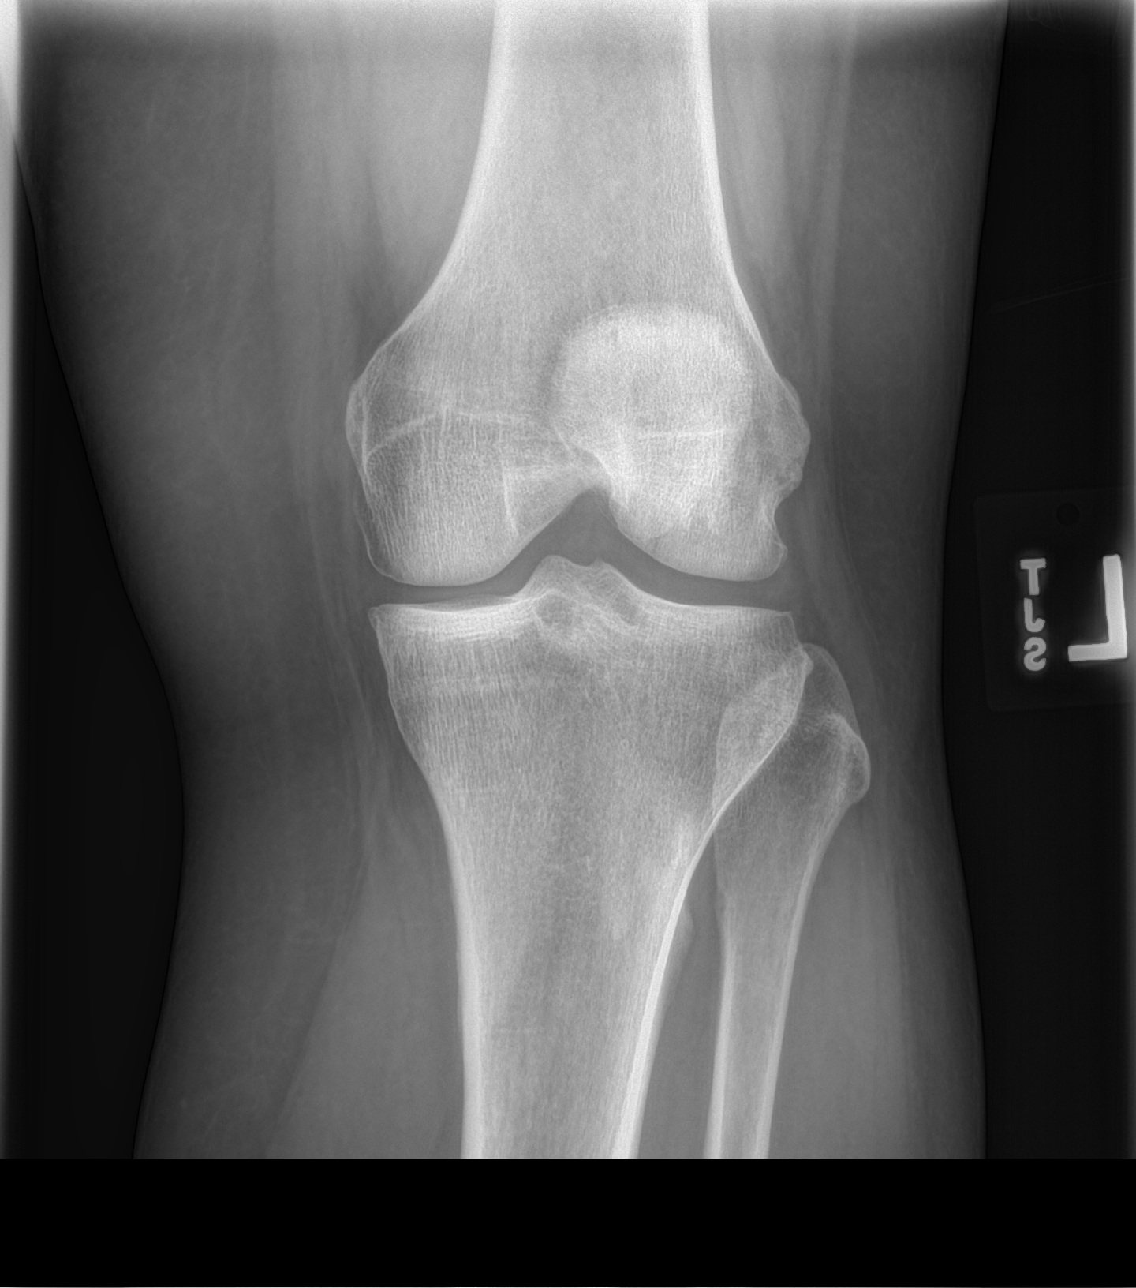
[im 4/4]
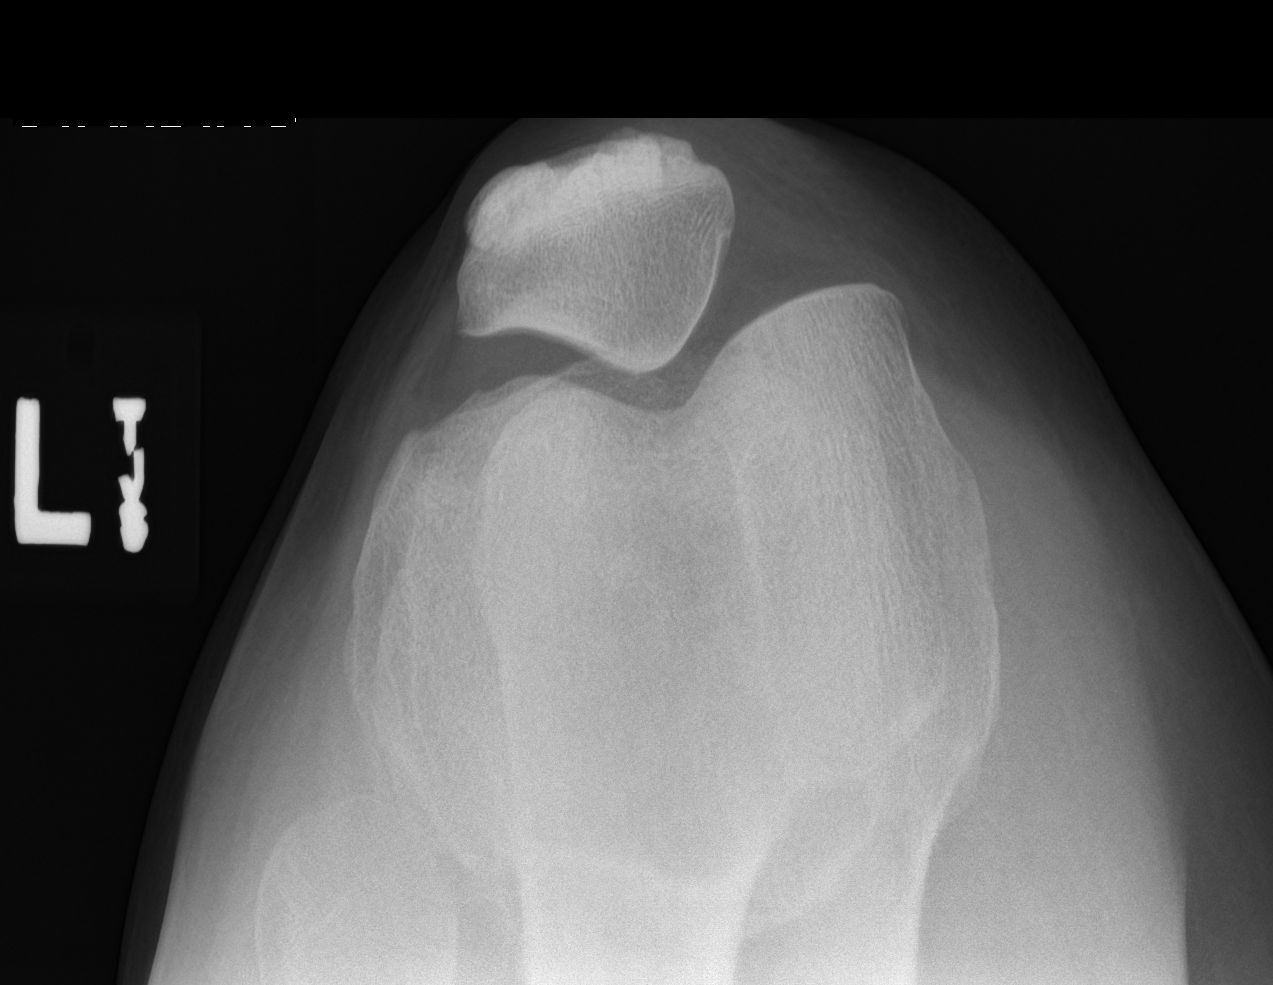

[4 of 4 positions shown; findings below may reference images not displayed]

FINDINGS: No acute fracture or malalignment. Mild medial compartment joint
space narrowing of the bilateral knees, right worse than left. Small
enthesophytes at the bilateral quadriceps tendon insertions. Small
right knee joint effusion. There may be a trace left knee joint
effusion. Small amount of smooth bony callus noted within the
proximal right fibular diaphysis, likely reflecting sequela of
remote trauma. Soft tissues appear within normal limits.
IMPRESSION: 1. Mild medial compartment joint space narrowing of the bilateral
knees, right worse than left.
2. Small right knee joint effusion.

## 2020-08-28 NOTE — Patient Instructions (Signed)
[facility] [place], [place]  [phone] Open ? Closes 9PM   Acute Knee Pain, Adult Many things can cause knee pain. Sometimes, knee pain is sudden (acute) and may be caused by damage, swelling, or irritation of the muscles and tissues that support your knee. The pain often goes away on its own with time and rest. If the pain does not go away, tests may be done to find out what is causing the pain. Follow these instructions at home: If you have a knee sleeve or brace:  Wear the knee sleeve or brace as told by your doctor. Take it off only as told by your doctor.  Loosen it if your toes: ? Tingle. ? Become numb. ? Turn cold and blue.  Keep it clean.  If the knee sleeve or brace is not waterproof: ? Do not let it get wet. ? Cover it with a watertight covering when you take a bath or shower.   Activity  Rest your knee.  Do not do things that cause pain or make pain worse.  Avoid activities where both feet leave the ground at the same time (high-impact activities). Examples are running, jumping rope, and doing jumping jacks.  Work with a physical therapist to make a safe exercise program, as told by your doctor. Managing pain, stiffness, and swelling  If told, put ice on the knee. To do this: ? If you have a removable knee sleeve or brace, take it off as told by your doctor. ? Put ice in a plastic bag. ? Place a towel between your skin and the bag. ? Leave the ice on for 20 minutes, 2-3 times a day. ? Take off the ice if your skin turns bright red. This is very important. If you cannot feel pain, heat, or cold, you have a greater risk of damage to the area.  If told, use an elastic bandage to put pressure (compression) on your injured knee.  Raise your knee above the level of your heart while you are sitting or lying down.  Sleep with a pillow under your knee.   General instructions  Take over-the-counter and prescription medicines only as  told by your doctor.  Do not smoke or use any products that contain nicotine or tobacco. If you need help quitting, ask your doctor.  If you are overweight, work with your doctor and a food expert (dietitian) to set goals to lose weight. Being overweight can make your knee hurt more.  Watch for any changes in your symptoms.  Keep all follow-up visits. Contact a doctor if:  The knee pain does not stop.  The knee pain changes or gets worse.  You have a fever along with knee pain.  Your knee is red or feels warm when you touch it.  Your knee gives out or locks up. Get help right away if:  Your knee swells, and the swelling gets worse.  You cannot move your knee.  You have very bad knee pain that does not get better with pain medicine. Summary  Many things can cause knee pain. The pain often goes away on its own with time and rest.  Your doctor may do tests to find out the cause of the pain.  Watch for any changes in your symptoms. Relieve your pain with rest, medicines, light activity, and use of ice.  Get help right away if you cannot move your knee or your knee pain is very bad. This information is not intended to  replace advice given to you by your health care provider. Make sure you discuss any questions you have with your health care provider. Document Revised: 08/24/2019 Document Reviewed: 08/24/2019 Elsevier Patient Education  2021 [employer].

## 2020-08-28 NOTE — Progress Notes (Signed)
Acute Office Visit  Subjective:    Patient ID: Nicole Escobar, female    DOB: [birth year], 45 y.o.   MRN: [id number]  Chief Complaint  Patient presents with  . Joint Swelling    Swelling in rt and lt knee . Pt states shes had an ultrasound . Pt states Low range of motion in rt knee and weakness.    Knee Pain  The incident occurred more than 1 week ago (3 weeks ago. ). Incident location: no  injury  There was no injury mechanism. The pain is present in the left knee and right knee. The quality of the pain is described as aching. The pain is at a severity of 6/10 (dull ache pain. constant. ). The pain is mild. The pain has been constant since onset. Associated symptoms comments: Decreased strength in knees getting out of bathtub she reports. She has not ever had this in past. . She reports no foreign bodies present. The symptoms are aggravated by movement and weight bearing. She has tried NSAIDs, non-weight bearing, ice, immobilization and acetaminophen for the symptoms. The treatment provided mild relief.   She had hip aches in past she has had for " years".Bilateral hips.   ANA POSITIVE 1 YEAR AGO. She was evaluated by Dr. Barb Merino rheumatology and also by Sports medicine. She was diagnosed with polyarthralgia, she is concerned though " due to her grandfather had crippling arthritis".  Right DVT ultrasound on 08/25/20 negative for DVT. IMPRESSION: No evidence of right lower extremity deep venous thrombosis.  Patient  denies any fever,chills, rash, chest pain, shortness of breath, nausea, vomiting, or diarrhea.   Denies dizziness, lightheadedness, pre syncopal or syncopal episodes.  Electronically Signed   By: Genevie Ann M.D.   On: 08/25/2020 06:32 Past Medical History:  Diagnosis Date  . Emphysema of lung ([place])   . Frequent headaches    unknown onset    Past Surgical History:  Procedure Laterality Date  . TONSILECTOMY/ADENOIDECTOMY WITH MYRINGOTOMY Bilateral   . TUBAL LIGATION  2013     Family History  Problem Relation Age of Onset  . Alcohol abuse Mother   . Arthritis Mother   . Cancer Mother        ovary  . Hyperlipidemia Mother   . Hypertension Mother   . Diabetes Mother   . Arthritis Maternal Grandmother   . Hypertension Maternal Grandmother   . Cancer Maternal Grandmother        colon    Social History   Socioeconomic History  . Marital status: Single    Spouse name: Not on file  . Number of children: Not on file  . Years of education: Not on file  . Highest education level: Not on file  Occupational History  . Not on file  Tobacco Use  . Smoking status: Former Smoker    Quit date: 03/25/2003    Years since quitting: 17.4  . Smokeless tobacco: Never Used  Substance and Sexual Activity  . Alcohol use: No    Alcohol/week: 0.0 standard drinks  . 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   . Sexual activity: Yes    Partners: Male  Other Topics Concern  . 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    23 year old daughter at [employer]   74 year old son  Long-term relationship    GED degree    Social Determinants of Health   Financial Resource Strain: Not on file  Food Insecurity: Not on file  Transportation Needs: Not on file  Physical Activity: Not on file  Stress: Not on file  Social Connections: Not on file  Intimate Partner Violence: Not on file    Outpatient Medications Prior to Visit  Medication Sig Dispense Refill  . albuterol (VENTOLIN HFA) 108 (90 Base) MCG/ACT inhaler Inhale 2 puffs into the lungs every 6 (six) hours as needed for wheezing or shortness of breath. 8 g 0  . docusate sodium (COLACE) 100 MG capsule Take 1 capsule (100 mg total) by mouth 2 (two) times daily. (Patient not taking: Reported on 08/28/2020) 30 capsule 0  . hydrocortisone (ANUSOL-HC) 25 MG suppository Place 1 suppository (25 mg total) rectally 2 (two)  times daily. (Patient not taking: Reported on 08/28/2020) 12 suppository 1   No facility-administered medications prior to visit.    Allergies  Allergen Reactions  . Sulfa Antibiotics Rash    Review of Systems  Constitutional: Positive for fatigue. Negative for activity change, appetite change, chills, diaphoresis, fever and unexpected weight change.  HENT: Negative.   Respiratory: Negative.   Cardiovascular: Negative.   Genitourinary: Negative.   Musculoskeletal: Positive for arthralgias and joint swelling. Negative for back pain, gait problem, myalgias, neck pain and neck stiffness.  Neurological: Negative.   Psychiatric/Behavioral: Negative.        Objective:    Physical Exam Vitals reviewed.  Constitutional:      General: She is not in acute distress.    Appearance: She is well-developed. She is not diaphoretic.     Interventions: She is not intubated. HENT:     Head: Normocephalic and atraumatic.     Right Ear: External ear normal.     Left Ear: External ear normal.     Nose: Nose normal.     Mouth/Throat:     Pharynx: No oropharyngeal exudate.  Eyes:     General: Lids are normal. No scleral icterus.       Right eye: No discharge.        Left eye: No discharge.     Conjunctiva/sclera: Conjunctivae normal.     Right eye: Right conjunctiva is not injected. No exudate or hemorrhage.    Left eye: Left conjunctiva is not injected. No exudate or hemorrhage.    Pupils: Pupils are equal, round, and reactive to light.  Neck:     Thyroid: No thyroid mass or thyromegaly.     Vascular: Normal carotid pulses. No carotid bruit, hepatojugular reflux or JVD.     Trachea: Trachea and phonation normal. No tracheal tenderness or tracheal deviation.     Meningeal: Brudzinski's sign and Kernig's sign absent.  Cardiovascular:     Rate and Rhythm: Normal rate and regular rhythm.     Pulses: Normal pulses.          Radial pulses are 2+ on the right side and 2+ on the left side.        Dorsalis pedis pulses are 2+ on the right side and 2+ on the left side.       Posterior tibial pulses are 2+ on the right side and 2+ on the left side.     Heart sounds: Normal heart sounds, S1 normal and S2 normal. Heart sounds not distant. No murmur heard. No friction rub. No gallop.   Pulmonary:     Effort: Pulmonary effort  is normal. No tachypnea, bradypnea, accessory muscle usage or respiratory distress. She is not intubated.     Breath sounds: Normal breath sounds. No stridor. No wheezing or rales.  Chest:     Chest wall: No tenderness.  Breasts:     Right: No supraclavicular adenopathy.     Left: No supraclavicular adenopathy.    Abdominal:     General: Bowel sounds are normal. There is no distension or abdominal bruit.     Palpations: Abdomen is soft. There is no shifting dullness, fluid wave, hepatomegaly, splenomegaly, mass or pulsatile mass.     Tenderness: There is no abdominal tenderness. There is no guarding or rebound.     Hernia: No hernia is present.  Musculoskeletal:        General: No tenderness or deformity. Normal range of motion.     Cervical back: Full passive range of motion without pain, normal range of motion and neck supple. No edema, erythema or rigidity. No spinous process tenderness or muscular tenderness. Normal range of motion.     Thoracic back: Normal.     Lumbar back: Normal.     Right hip: Normal.     Left hip: Normal.     Right knee: Effusion and bony tenderness present.     Left knee: Effusion and bony tenderness present.     Right lower leg: No edema.     Left lower leg: No edema.       Legs:     Comments: 2 + bilateral dorsalis pedis and posterior tibial pulses.   Lymphadenopathy:     Head:     Right side of head: No submental, submandibular, tonsillar, preauricular, posterior auricular or occipital adenopathy.     Left side of head: No submental, submandibular, tonsillar, preauricular, posterior auricular or occipital adenopathy.      Cervical: No cervical adenopathy.     Right cervical: No superficial, deep or posterior cervical adenopathy.    Left cervical: No superficial, deep or posterior cervical adenopathy.     Upper Body:     Right upper body: No supraclavicular or pectoral adenopathy.     Left upper body: No supraclavicular or pectoral adenopathy.  Skin:    General: Skin is warm and dry.     Coloration: Skin is not pale.     Findings: No abrasion, bruising, burn, ecchymosis, erythema, lesion, petechiae or rash.     Nails: There is no clubbing.  Neurological:     Mental Status: She is alert and oriented to person, place, and time.     GCS: GCS eye subscore is 4. GCS verbal subscore is 5. GCS motor subscore is 6.     Cranial Nerves: No cranial nerve deficit.     Sensory: No sensory deficit.     Motor: No tremor, atrophy, abnormal muscle tone or seizure activity.     Coordination: Coordination normal.     Gait: Gait normal.     Deep Tendon Reflexes: Reflexes are normal and symmetric. Reflexes normal. Babinski sign absent on the right side. Babinski sign absent on the left side.     Reflex Scores:      Tricep reflexes are 2+ on the right side and 2+ on the left side.      Bicep reflexes are 2+ on the right side and 2+ on the left side.      Brachioradialis reflexes are 2+ on the right side and 2+ on the left side.      Patellar  reflexes are 2+ on the right side and 2+ on the left side.      Achilles reflexes are 2+ on the right side and 2+ on the left side. Psychiatric:        Speech: Speech normal.        Behavior: Behavior normal.        Thought Content: Thought content normal.        Judgment: Judgment normal.     BP (!) 158/92   Pulse 76   Temp 97.6 F (36.4 C)   Ht 5' 4.02" (1.626 m)   Wt 203 lb (92.1 kg)   LMP 08/05/2020   SpO2 99%   BMI 34.83 kg/m  Wt Readings from Last 3 Encounters:  08/28/20 203 lb (92.1 kg)  08/25/20 210 lb (95.3 kg)  08/02/19 209 lb (94.8 kg)    Health Maintenance  Due  Topic Date Due  . COVID-19 Vaccine (1) Never done  . Hepatitis C Screening  Never done  . TETANUS/TDAP  Never done  . PAP SMEAR-Modifier  Never done  . COLONOSCOPY (Pts 45-5yrs Insurance coverage will need to be confirmed)  Never done    There are no preventive care reminders to display for this patient.   Lab Results  Component Value Date   TSH 0.95 01/05/2019   Lab Results  Component Value Date   WBC 8.2 06/30/2020   HGB 13.7 06/30/2020   HCT 40.9 06/30/2020   MCV 85.0 06/30/2020   PLT 317 06/30/2020   Lab Results  Component Value Date   NA 135 06/30/2020   K 3.5 06/30/2020   CO2 22 06/30/2020   GLUCOSE 126 (H) 06/30/2020   BUN 9 06/30/2020   CREATININE 0.77 06/30/2020   BILITOT 0.6 06/30/2020   ALKPHOS 51 06/30/2020   AST 19 06/30/2020   ALT 14 06/30/2020   PROT 7.0 06/30/2020   ALBUMIN 3.8 06/30/2020   CALCIUM 8.9 06/30/2020   ANIONGAP 9 06/30/2020   GFR 72.86 01/05/2019   No results found for: CHOL No results found for: HDL No results found for: LDLCALC No results found for: TRIG No results found for: CHOLHDL Lab Results  Component Value Date   HGBA1C 5.7 01/05/2019       Assessment & Plan:   Problem List Items Addressed This Visit      Other   Acute pain of both knees - Primary   Relevant Orders   CBC with Differential/Platelet   Comprehensive metabolic panel   Rheumatoid factor   B12 and Folate Panel   ANA Screen,IFA,Reflex Titer/Pattern,Reflex Mplx 11 Ab Cascade with IdentRA   TSH   DG Knee Complete 4 Views Right   DG Knee Complete 4 Views Left   Arthralgia     Will check rheumatoid labs as well, bilateral x rays of knees at [facility]. Start aleve once daily per package instructions and take with food. Discussed possible side effects and GI discomfort.   Likely will need orthopedics also discussed walk [facility] if any symptoms worse, persist or change at anytime this walk in is Monday to Friday.    Return in about 3 weeks (around 09/18/2020), or if symptoms worsen or fail to improve, for at any time for any worsening symptoms, Go to Emergency room/ urgent care if worse.  No orders of the defined types were placed in this encounter.    Marcille Buffy, FNP

## 2020-08-28 NOTE — Telephone Encounter (Signed)
PT called to obtain the lab results that she had done and would like a callback.

## 2020-08-29 ENCOUNTER — Telehealth: Payer: Self-pay

## 2020-08-29 LAB — RHEUMATOID FACTOR: Rheumatoid fact SerPl-aCnc: <14 IU/mL (ref <14)

## 2020-08-29 NOTE — Progress Notes (Signed)
Right knee / left knee x ray shows: joint space narrowing that is mild in left knee and worse in right knee  and small area of fluid on right knee ( effusion of knee)  Recommend orthopedics referral if she is in agreement.

## 2020-08-29 NOTE — Telephone Encounter (Signed)
PT called to request a callback in regards to the results from the lab that was done.

## 2020-08-29 NOTE — Telephone Encounter (Signed)
Left message to return call for lab results.

## 2020-08-30 ENCOUNTER — Other Ambulatory Visit: Payer: Self-pay | Admitting: Adult Health

## 2020-08-30 DIAGNOSIS — E538 Deficiency of other specified B group vitamins: Secondary | ICD-10-CM

## 2020-08-30 NOTE — Progress Notes (Signed)
Rheumatoid factor is within normal limits. CMP is within normal limits except for mildly elevated glucose at 109 would recommend diet and lifestyle changes if this was a fasting glucose. CBC is within normal limits. Vitamin B12 level is low at 182, folate level is within normal limits.  Latoya can we add on a intrinsic factor for patient to be sure that she is able to absorb vitamin B12? Order for lab was placed.   Kristal please verify if patient is taking any B12 supplements over-the-counter currently?

## 2020-08-30 NOTE — Progress Notes (Signed)
Orders Placed This Encounter  Procedures   Intrinsic Factor Antibodies    Standing Status:   Future    Standing Expiration Date:   08/30/2021   B12 deficiency - Plan: Intrinsic Factor Antibodies

## 2020-08-31 ENCOUNTER — Other Ambulatory Visit: Payer: BC Managed Care – PPO

## 2020-08-31 DIAGNOSIS — E538 Deficiency of other specified B group vitamins: Secondary | ICD-10-CM

## 2020-09-01 LAB — ANA SCREEN,IFA,REFLEX TITER/PATTERN,REFLEX MPLX 11 AB CASCADE
14-3-3 eta Protein: <0.2 ng/mL (ref <0.2)
Anti Nuclear Antibody (ANA): NEGATIVE
Cyclic Citrullin Peptide Ab: <16 UNITS
Rheumatoid fact SerPl-aCnc: <14 IU/mL (ref <14)

## 2020-09-03 ENCOUNTER — Other Ambulatory Visit: Payer: Self-pay | Admitting: Adult Health

## 2020-09-03 DIAGNOSIS — M25562 Pain in left knee: Secondary | ICD-10-CM

## 2020-09-03 DIAGNOSIS — M25561 Pain in right knee: Secondary | ICD-10-CM

## 2020-09-03 DIAGNOSIS — R936 Abnormal findings on diagnostic imaging of limbs: Secondary | ICD-10-CM

## 2020-09-03 NOTE — Progress Notes (Signed)
Orders Placed This Encounter  Procedures   Ambulatory referral to Orthopedic Surgery

## 2020-09-04 ENCOUNTER — Other Ambulatory Visit: Payer: Self-pay | Admitting: Adult Health

## 2020-09-04 DIAGNOSIS — E538 Deficiency of other specified B group vitamins: Secondary | ICD-10-CM

## 2020-09-04 LAB — INTRINSIC FACTOR ANTIBODIES: Intrinsic Factor: NEGATIVE

## 2020-09-04 MED ORDER — B-12 1000 MCG SL SUBL: 1.0000 | SUBLINGUAL_TABLET | Freq: Every day | SUBLINGUAL | 1 refills | Status: DC

## 2020-09-04 NOTE — Progress Notes (Signed)
Meds ordered this encounter  Medications   Cyanocobalamin (B-12) 1000 MCG SUBL    Sig: Place 1 tablet under the tongue daily.    Dispense:  90 tablet    Refill:  1

## 2020-09-04 NOTE — Progress Notes (Signed)
Intrinsic factor negative, meaning likely not pernicious anemia and she can absorb the oral B 12.  Sent to pharmacy : Meds ordered this encounter Medications  Cyanocobalamin (B-12) 1000 MCG SUBL   Sig: Place 1 tablet under the tongue daily.   Dispense:  90 tablet   Refill:  1  Recheck B12 and folate lab in 3 months please schedule.

## 2020-09-06 ENCOUNTER — Other Ambulatory Visit: Payer: BC Managed Care – PPO

## 2020-09-07 ENCOUNTER — Telehealth: Payer: BC Managed Care – PPO | Admitting: Emergency Medicine

## 2020-09-07 DIAGNOSIS — J029 Acute pharyngitis, unspecified: Secondary | ICD-10-CM | POA: Diagnosis not present

## 2020-09-07 MED ORDER — AMOXICILLIN 500 MG PO CAPS: 500.0000 mg | ORAL_CAPSULE | Freq: Three times a day (TID) | ORAL | 0 refills | Status: DC

## 2020-09-07 NOTE — Progress Notes (Signed)
We are sorry that you are not feeling well.  Here is how we plan to help!  Based on what you have shared with me it is likely that you have strep pharyngitis.  Strep pharyngitis is inflammation and infection in the back of the throat.  This is an infection cause by bacteria and is treated with antibiotics.  I have prescribed Amoxicillin 500 mg twice a day for 10 days. For throat pain, we recommend over the counter oral pain relief medications such as acetaminophen or aspirin, or anti-inflammatory medications such as ibuprofen or naproxen sodium. Topical treatments such as oral throat lozenges or sprays may be used as needed. Strep infections are not as easily transmitted as other respiratory infections, however we still recommend that you avoid close contact with loved ones, especially the very young and elderly.  Remember to wash your hands thoroughly throughout the day as this is the number one way to prevent the spread of infection and wipe down door knobs and counters with disinfectant.   Home Care: Only take medications as instructed by your medical team. Complete the entire course of an antibiotic. Do not take these medications with alcohol. A steam or ultrasonic humidifier can help congestion.  You can place a towel over your head and breathe in the steam from hot water coming from a faucet. Avoid close contacts especially the very young and the elderly. Cover your mouth when you cough or sneeze. Always remember to wash your hands.  Get Help Right Away If: You develop worsening fever or sinus pain. You develop a severe head ache or visual changes. Your symptoms persist after you have completed your treatment plan.  Make sure you Understand these instructions. Will watch your condition. Will get help right away if you are not doing well or get worse.  Your e-visit answers were reviewed by a board certified advanced clinical practitioner to complete your personal care plan.  Depending on  the condition, your plan could have included both over the counter or prescription medications.  If there is a problem please reply  once you have received a response from your provider.  Your safety is important to [place].  If you have drug allergies check your prescription carefully.    You can use MyChart to ask questions about today's visit, request a non-urgent call back, or ask for a work or school excuse for 24 hours related to this e-Visit. If it has been greater than 24 hours you will need to follow up with your provider, or enter a new e-Visit to address those concerns.  You will get an e-mail in the next two days asking about your experience.  I hope that your e-visit has been valuable and will speed your recovery. Thank you for using e-visits.  Approximately 5 minutes was used in reviewing the patient's chart, questionnaire, prescribing medications, and documentation.

## 2020-09-12 ENCOUNTER — Other Ambulatory Visit: Payer: Self-pay

## 2020-09-12 ENCOUNTER — Ambulatory Visit: Payer: Self-pay | Admitting: *Deleted

## 2020-09-12 ENCOUNTER — Emergency Department: Payer: BC Managed Care – PPO

## 2020-09-12 ENCOUNTER — Emergency Department
Admission: EM | Admit: 2020-09-12 | Discharge: 2020-09-12 | Disposition: A | Discharge status: Home / Self Care | Payer: BC Managed Care – PPO | Attending: Emergency Medicine | Admitting: Emergency Medicine

## 2020-09-12 DIAGNOSIS — R072 Precordial pain: Secondary | ICD-10-CM | POA: Diagnosis not present

## 2020-09-12 DIAGNOSIS — R0789 Other chest pain: Secondary | ICD-10-CM | POA: Diagnosis not present

## 2020-09-12 DIAGNOSIS — Z87891 Personal history of nicotine dependence: Secondary | ICD-10-CM | POA: Insufficient documentation

## 2020-09-12 DIAGNOSIS — R1013 Epigastric pain: Secondary | ICD-10-CM | POA: Diagnosis not present

## 2020-09-12 DIAGNOSIS — K2970 Gastritis, unspecified, without bleeding: Secondary | ICD-10-CM | POA: Diagnosis not present

## 2020-09-12 LAB — TROPONIN I (HIGH SENSITIVITY)
Troponin I (High Sensitivity): 3 ng/L (ref <18)
Troponin I (High Sensitivity): 4 ng/L (ref <18)

## 2020-09-12 LAB — CBC
HCT: 41.5 % (ref 36.0–46.0)
Hemoglobin: 14.4 g/dL (ref 12.0–15.0)
MCH: 28.4 pg (ref 26.0–34.0)
MCHC: 34.7 g/dL (ref 30.0–36.0)
MCV: 81.9 fL (ref 80.0–100.0)
Platelets: 264 10*3/uL (ref 150–400)
RBC: 5.07 MIL/uL (ref 3.87–5.11)
RDW: 12.5 % (ref 11.5–15.5)
WBC: 6.6 10*3/uL (ref 4.0–10.5)
nRBC: 0 % (ref 0.0–0.2)

## 2020-09-12 LAB — BASIC METABOLIC PANEL
Anion gap: 8 (ref 5–15)
BUN: 11 mg/dL (ref 6–20)
CO2: 22 mmol/L (ref 22–32)
Calcium: 9 mg/dL (ref 8.9–10.3)
Chloride: 106 mmol/L (ref 98–111)
Creatinine, Ser: 0.7 mg/dL (ref 0.44–1.00)
GFR, Estimated: >60 mL/min (ref >60)
Glucose, Bld: 128 mg/dL — ABNORMAL HIGH (ref 70–99)
Potassium: 3.4 mmol/L — ABNORMAL LOW (ref 3.5–5.1)
Sodium: 136 mmol/L (ref 135–145)

## 2020-09-12 LAB — D-DIMER, QUANTITATIVE: D-Dimer, Quant: <0.27 ug/mL-FEU (ref 0.00–0.50)

## 2020-09-12 IMAGING — CR DG CHEST 2V
1 series · 2 of 2 positions shown · non-contrast
Comparison: [DATE]

CLINICAL DATA: Chest pressure

EXAM:
CHEST - 2 VIEW

[Series 1: dg chest 2 view · 0.14mm/px · 2 of 2 slices shown]
[im 1/2]
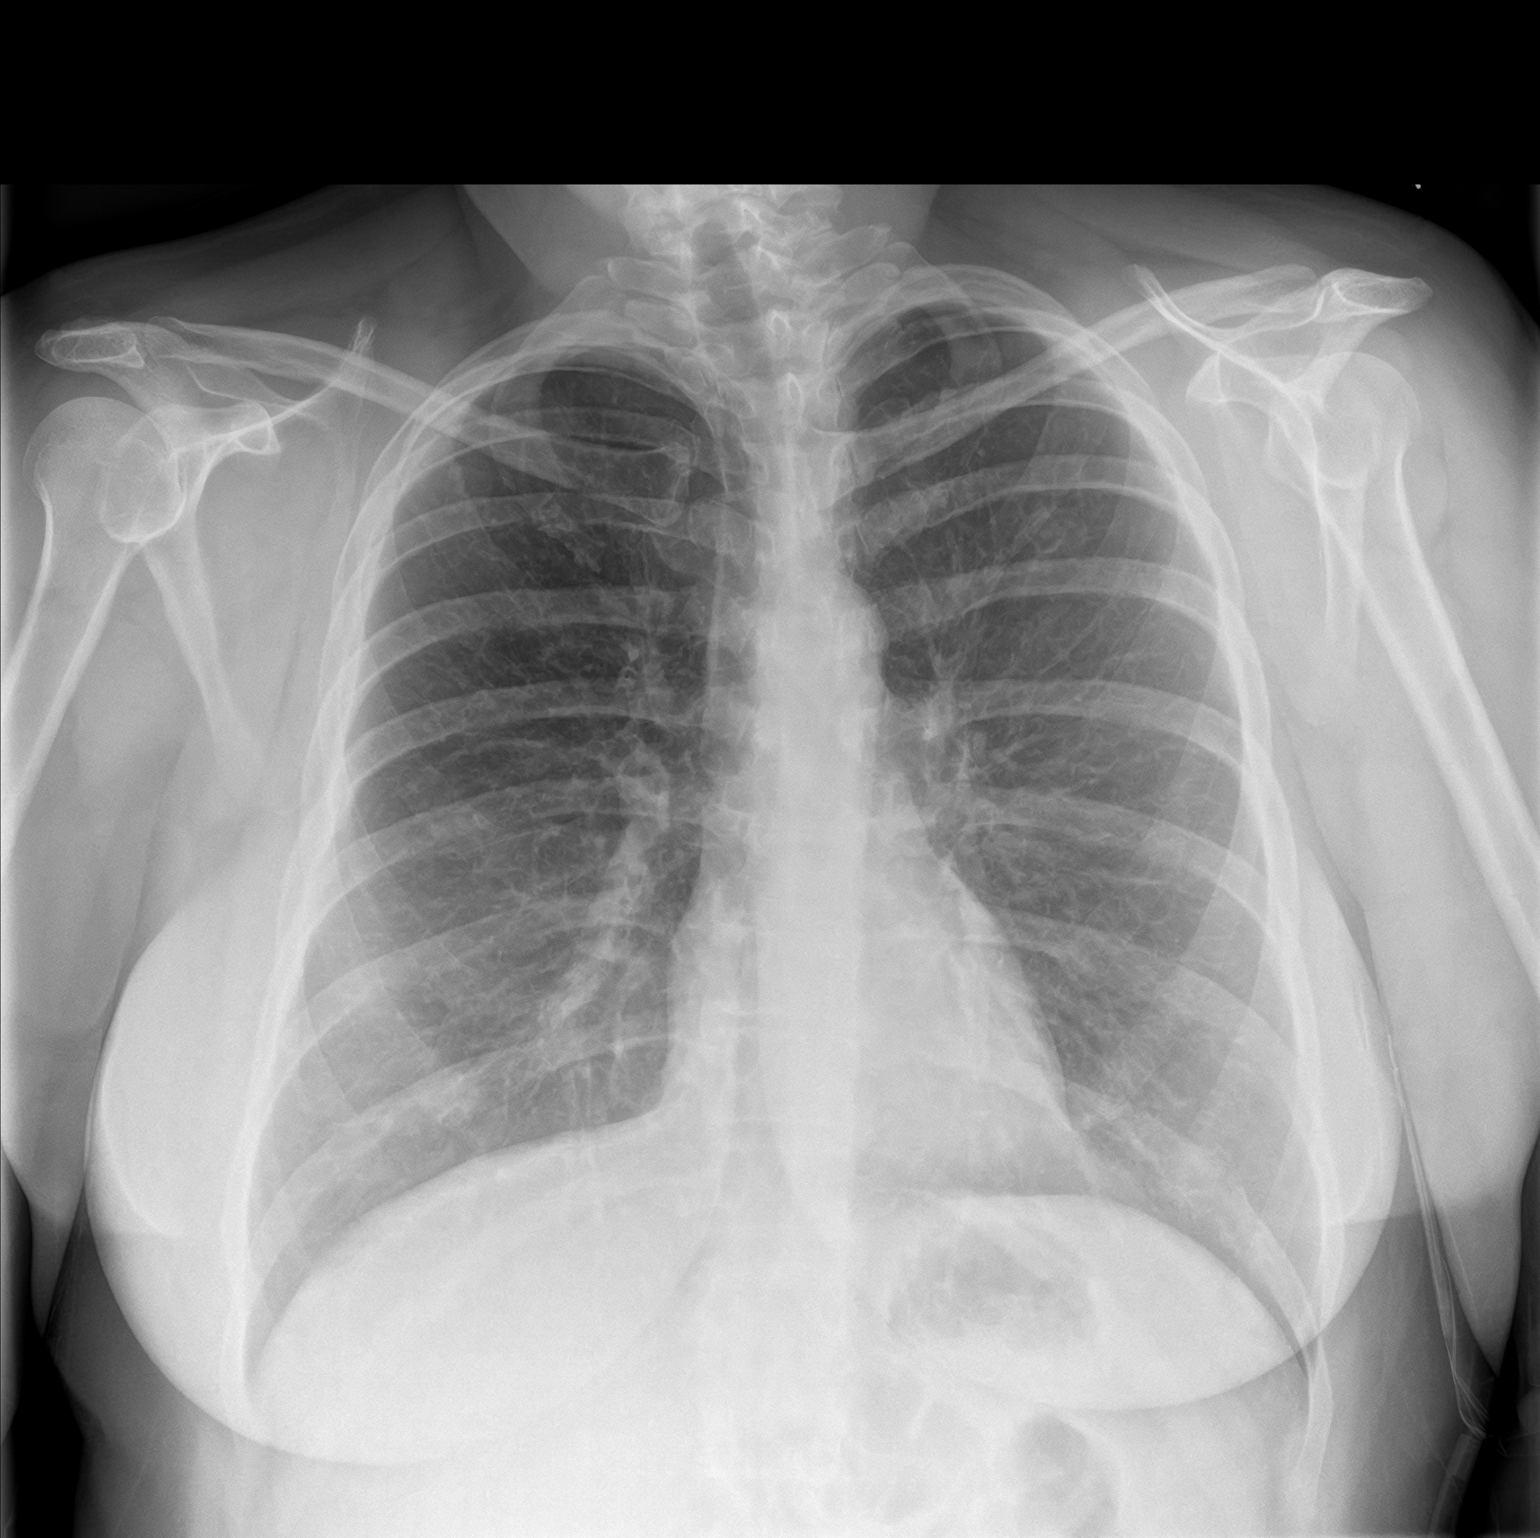
[im 2/2]
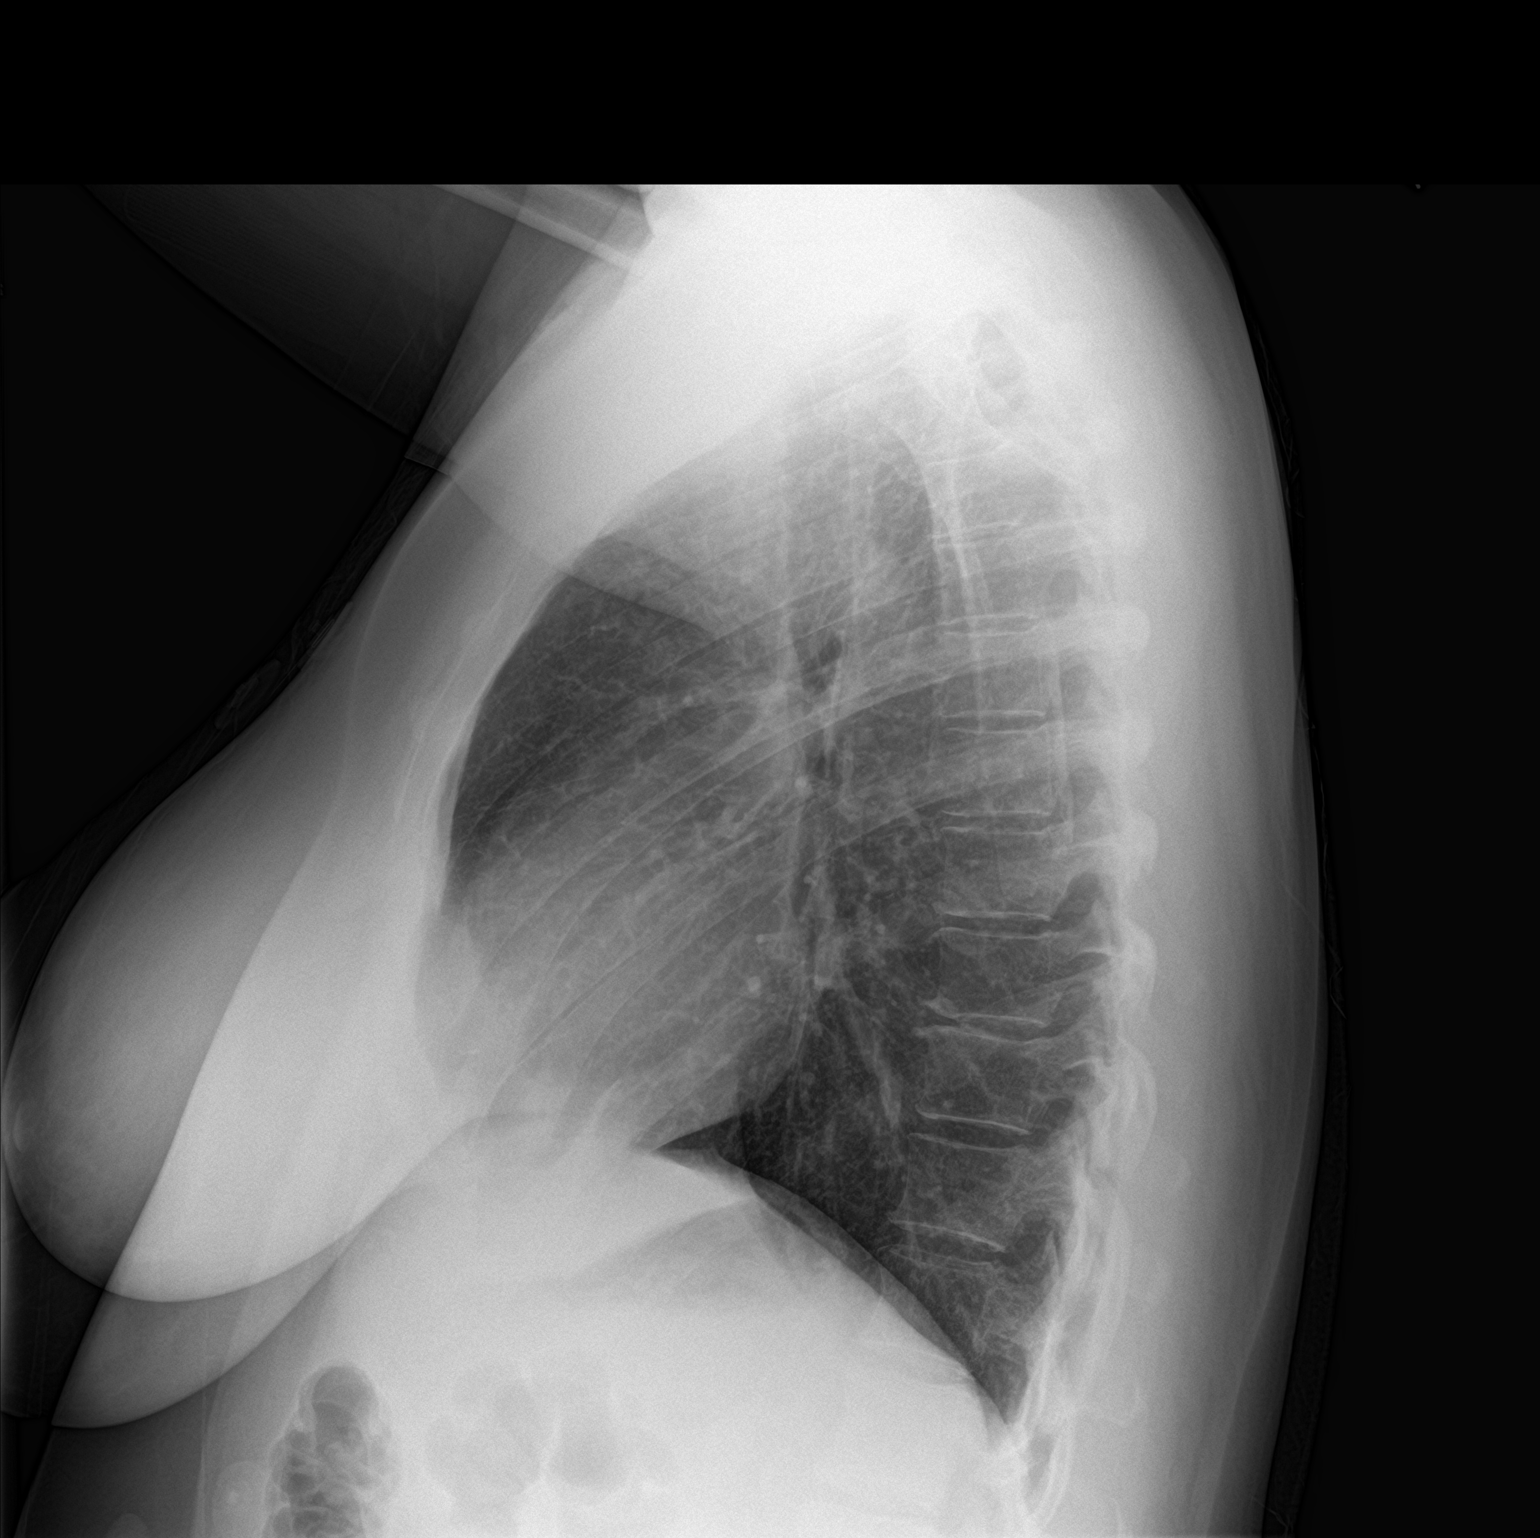

[2 of 2 positions shown; findings below may reference images not displayed]

FINDINGS: The heart size and mediastinal contours are within normal limits.
Both lungs are clear. The visualized skeletal structures are
unremarkable.
IMPRESSION: No active cardiopulmonary disease.

## 2020-09-12 MED ORDER — PANTOPRAZOLE SODIUM 40 MG PO TBEC
40.0000 mg | DELAYED_RELEASE_TABLET | Freq: Once | ORAL | Status: AC
  Administered 2020-09-12: 40 mg via ORAL
  Filled 2020-09-12: qty 1

## 2020-09-12 MED ORDER — SUCRALFATE 1 G PO TABS: 1.0000 g | ORAL_TABLET | Freq: Four times a day (QID) | ORAL | 0 refills | Status: DC

## 2020-09-12 MED ORDER — SUCRALFATE 1 G PO TABS
1.0000 g | ORAL_TABLET | Freq: Once | ORAL | Status: AC
  Administered 2020-09-12: 1 g via ORAL
  Filled 2020-09-12: qty 1

## 2020-09-12 MED ORDER — OMEPRAZOLE MAGNESIUM 20 MG PO TBEC: 20.0000 mg | DELAYED_RELEASE_TABLET | Freq: Every day | ORAL | 11 refills | Status: DC

## 2020-09-12 NOTE — Telephone Encounter (Signed)
C/o covid symptoms since Thursday 09/06/20. Positive for covid x 7 days. C/o chest pressure and "feels funny" when swallowing. C/o  pressure when laying down flat. Burning noted at top of stomach. Denies shortness of breath but reports feeling like she needs to hold her breath to decrease the pressure. C/o feeling "little bit hot then cold". Pressure/ discomfort started in middle of chest last night. Denies dizziness, lightheaded. Encouraged patient to go to ED. Patient reports her family has high risk heart attacks. Care advise given. Patient verbalized understanding of care advise and to to go to ED but does not want to call 911 unless necessary . Encouraged patient not to drive self. Patient reports she feels she can drive.

## 2020-09-12 NOTE — ED Provider Notes (Signed)
[facility] Emergency Department Provider Note   ____________________________________________   Event Date/Time   First MD Initiated Contact with Patient 09/12/20 1743     (approximate)  I have reviewed the triage vital signs and the nursing notes.   HISTORY  Chief Complaint Chest Pain and COVID +    HPI Nicole Escobar is a 45 y.o. female with the below stated past medical history who presents for epigastric abdominal pain and lower sternal chest pain that is been worsening over the last 7 days.  Patient describes burning, 2/10, epigastric and lower sternal pain that radiates to the left and right upper quadrants.  Patient denies any exacerbating or relieving factors.  Patient currently denies any vision changes, tinnitus, difficulty speaking, facial droop, sore throat, shortness of breath, nausea/vomiting/diarrhea, dysuria, or weakness/numbness/paresthesias in any extremity         Past Medical History:  Diagnosis Date   Emphysema of lung ([place])    Frequent headaches    unknown onset    Patient Active Problem List   Diagnosis Date Noted   Acute pain of both knees 08/28/2020   Arthralgia 08/28/2020   External hemorrhoid, thrombosed 07/26/2019   GAD (generalized anxiety disorder) 02/08/2018   Lower abdominal pain 02/08/2018   Boil 02/08/2018   Elevated blood pressure reading 02/08/2018   Chest pain 02/08/2018   Left ear pain 11/18/2016   History of cocaine abuse ([place]) 04/03/2016   Influenza-like illness 05/14/2015   Benign paroxysmal positional vertigo 10/17/2014   Hand joint pain 05/16/2014   Bilateral carpal tunnel syndrome 05/16/2014   Stress headaches 03/16/2014    Past Surgical History:  Procedure Laterality Date   TONSILECTOMY/ADENOIDECTOMY WITH MYRINGOTOMY Bilateral    TUBAL LIGATION  2013    Prior to Admission medications   Medication Sig Start Date End Date Taking? Authorizing Provider  omeprazole (PRILOSEC OTC) 20 MG tablet  Take 1 tablet (20 mg total) by mouth daily. 09/12/20 09/12/21 Yes Maxwell Martorano, Vista Lawman, MD  sucralfate (CARAFATE) 1 g tablet Take 1 tablet (1 g total) by mouth 4 (four) times daily for 10 days. 09/12/20 09/22/20 Yes Naaman Plummer, MD  albuterol (VENTOLIN HFA) 108 (90 Base) MCG/ACT inhaler Inhale 2 puffs into the lungs every 6 (six) hours as needed for wheezing or shortness of breath. 04/07/19   Copland, Frederico Hamman, MD  amoxicillin (AMOXIL) 500 MG capsule Take 1 capsule (500 mg total) by mouth 3 (three) times daily. 09/07/20   Montine Circle, PA-C  Cyanocobalamin (B-12) 1000 MCG SUBL Place 1 tablet under the tongue daily. 09/04/20   Flinchum, Kelby Aline, FNP  docusate sodium (COLACE) 100 MG capsule Take 1 capsule (100 mg total) by mouth 2 (two) times daily. Patient not taking: Reported on 08/28/2020 07/26/19   Marval Regal, NP  hydrocortisone (ANUSOL-HC) 25 MG suppository Place 1 suppository (25 mg total) rectally 2 (two) times daily. Patient not taking: Reported on 08/28/2020 07/26/19   Marval Regal, NP    Allergies Sulfa antibiotics  Family History  Problem Relation Age of Onset   Alcohol abuse Mother    Arthritis Mother    Cancer Mother        ovary   Hyperlipidemia Mother    Hypertension Mother    Diabetes Mother    Arthritis Maternal Grandmother    Hypertension Maternal Grandmother    Cancer Maternal Grandmother        colon    Social History Social History   Tobacco Use  Smoking status: Former    Pack years: 0.00    Types: Cigarettes    Quit date: 03/25/2003    Years since quitting: 17.4   Smokeless tobacco: Never  Substance Use Topics   Alcohol use: No    Alcohol/week: 0.0 standard drinks   Drug use: Yes    Frequency: 0.4 times per week    Types: 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     Review of Systems Constitutional: No fever/chills Eyes: No visual changes. ENT: No sore throat. Cardiovascular: Endorses chest pain. Respiratory: Denies shortness of  breath. Gastrointestinal: Endorses epigastric abdominal pain.  No nausea, no vomiting.  No diarrhea. Genitourinary: Negative for dysuria. Musculoskeletal: Negative for acute arthralgias Skin: Negative for rash. Neurological: Negative for headaches, weakness/numbness/paresthesias in any extremity Psychiatric: Negative for suicidal ideation/homicidal ideation   ____________________________________________   PHYSICAL EXAM:  VITAL SIGNS: ED Triage Vitals  Enc Vitals Group     BP 09/12/20 1527 (!) 181/98     Pulse Rate 09/12/20 1527 67     Resp 09/12/20 1527 20     Temp 09/12/20 1527 98.9 F (37.2 C)     Temp Source 09/12/20 1527 Oral     SpO2 09/12/20 1527 98 %     Weight 09/12/20 1528 200 lb (90.7 kg)     Height 09/12/20 1528 5\' 4"  (1.626 m)     Head Circumference --      Peak Flow --      Pain Score 09/12/20 1528 2     Pain Loc --      Pain Edu? --      Excl. in [place]? --    Constitutional: Alert and oriented. Well appearing and in no acute distress. Eyes: Conjunctivae are normal. PERRL. Head: Atraumatic. Nose: No congestion/rhinnorhea. Mouth/Throat: Mucous membranes are moist. Neck: No stridor Cardiovascular: Grossly normal heart sounds.  Good peripheral circulation. Respiratory: Normal respiratory effort.  No retractions. Gastrointestinal: Soft and nontender. No distention. Musculoskeletal: No obvious deformities Neurologic:  Normal speech and language. No gross focal neurologic deficits are appreciated. Skin:  Skin is warm and dry. No rash noted. Psychiatric: Mood and affect are normal. Speech and behavior are normal.  ____________________________________________   LABS (all labs ordered are listed, but only abnormal results are displayed)  Labs Reviewed  BASIC METABOLIC PANEL - Abnormal; Notable for the following components:      Result Value   Potassium 3.4 (*)    Glucose, Bld 128 (*)    All other components within normal limits  CBC  D-DIMER, QUANTITATIVE   POC URINE PREG, ED  TROPONIN I (HIGH SENSITIVITY)  TROPONIN I (HIGH SENSITIVITY)   ____________________________________________  EKG  ED ECG REPORT I, Naaman Plummer, the attending physician, personally viewed and interpreted this ECG.  Date: 09/12/2020 EKG Time: 1521 Rate: 72 Rhythm: normal sinus rhythm QRS Axis: normal Intervals: normal ST/T Wave abnormalities: normal Narrative Interpretation: no evidence of acute ischemia  ____________________________________________  RADIOLOGY  ED MD interpretation: 2 view chest x-ray shows no evidence of acute abnormalities including no pneumonia, pneumothorax, or widened mediastinum  Official radiology report(s): DG Chest 2 View  Result Date: 09/12/2020 CLINICAL DATA:  Chest pressure EXAM: CHEST - 2 VIEW COMPARISON:  02/02/2015 FINDINGS: The heart size and mediastinal contours are within normal limits. Both lungs are clear. The visualized skeletal structures are unremarkable. IMPRESSION: No active cardiopulmonary disease. Electronically Signed   By: Donavan Foil M.D.   On: 09/12/2020 16:06    ____________________________________________  PROCEDURES  Procedure(s) performed (including Critical Care):  .1-3 Lead EKG Interpretation  Date/Time: 09/12/2020 8:24 PM Performed by: Naaman Plummer, MD Authorized by: Naaman Plummer, MD     Interpretation: normal     ECG rate:  70   ECG rate assessment: normal     Rhythm: sinus rhythm     Ectopy: none     Conduction: normal     ____________________________________________   INITIAL IMPRESSION / ASSESSMENT AND PLAN / ED COURSE  As part of my medical decision making, I reviewed the following data within the [place] notes reviewed and incorporated, Labs reviewed, EKG interpreted, Old chart reviewed, Radiograph reviewed and Notes from prior ED visits reviewed and incorporated        Patients symptoms not typical for emergent causes of abdominal pain  such as, but not limited to, appendicitis, abdominal aortic aneurysm, surgical biliary disease, pancreatitis, SBO, mesenteric ischemia, serious intra-abdominal bacterial illness. Presentation also not typical of gynecologic emergencies such as TOA, Ovarian Torsion, PID. Not Ectopic. Doubt atypical ACS.  Pt tolerating PO. Disposition: Patient will be discharged with strict return precautions and follow up with primary MD within 12-24 hours for further evaluation. Patient understands that this still may have an early presentation of an emergent medical condition such as appendicitis that will require a recheck.      ____________________________________________   FINAL CLINICAL IMPRESSION(S) / ED DIAGNOSES  Final diagnoses:  Atypical chest pain  Epigastric pain     ED Discharge Orders          Ordered    sucralfate (CARAFATE) 1 g tablet  4 times daily        09/12/20 1923    omeprazole (PRILOSEC OTC) 20 MG tablet  Daily        09/12/20 1923             Note:  This document was prepared using Dragon voice recognition software and may include unintentional dictation errors.    Naaman Plummer, MD 09/12/20 2024

## 2020-09-12 NOTE — ED Notes (Signed)
Pt states CP is lower in chest that was. Between chest and abdomen, central chest. States pain is burning in nature and happens more constantly now whereas before it was associated with eating or laying down.

## 2020-09-12 NOTE — ED Triage Notes (Signed)
Pt to ED for chest pressure intermittent since being dx with covid, states on last day of quarantine.  Denies N/V/D Reports minimal shob, hx COPD

## 2020-09-12 NOTE — Telephone Encounter (Signed)
Reason for Disposition  [1] Chest pain (or "angina") comes and goes AND [2] is happening more often (increasing in frequency) or getting worse (increasing in severity) (Exception: chest pains that last only a few seconds)  Answer Assessment - Initial Assessment Questions 1. LOCATION: "Where does it hurt?"       Middle of chest  2. RADIATION: "Does the pain go anywhere else?" (e.g., into neck, jaw, arms, back)     back 3. ONSET: "When did the chest pain begin?" (Minutes, hours or days)      Last night  4. PATTERN "Does the pain come and go, or has it been constant since it started?"  "Does it get worse with exertion?"      Comes and goes 5. DURATION: "How long does it last" (e.g., seconds, minutes, hours)     Na  6. SEVERITY: "How bad is the pain?"  (e.g., Scale 1-10; mild, moderate, or severe)    - MILD (1-3): doesn't interfere with normal activities     - MODERATE (4-7): interferes with normal activities or awakens from sleep    - SEVERE (8-10): excruciating pain, unable to do any normal activities       Becoming more pressure like  7. CARDIAC RISK FACTORS: "Do you have any history of heart problems or risk factors for heart disease?" (e.g., angina, prior heart attack; diabetes, high blood pressure, high cholesterol, smoker, or strong family history of heart disease)     No . Family hx hear attacks 8. PULMONARY RISK FACTORS: "Do you have any history of lung disease?"  (e.g., blood clots in lung, asthma, emphysema, birth control pills)     na 9. CAUSE: "What do you think is causing the chest pain?"     Not sure on day 7 of covid symptoms  10. OTHER SYMPTOMS: "Do you have any other symptoms?" (e.g., dizziness, nausea, vomiting, sweating, fever, difficulty breathing, cough)       Feeling hot and cold. Possible sweats and chest pressure  11. PREGNANCY: "Is there any chance you are pregnant?" "When was your last menstrual period?"       na  Protocols used: Chest Pain-A-AH

## 2020-09-13 ENCOUNTER — Telehealth: Payer: Self-pay | Admitting: Family

## 2020-09-13 NOTE — Telephone Encounter (Signed)
Rejection Reason - Patient was No Show" Virl Cagey said on Sep 13, 2020 2:48 PM  Pt appt was on 09/12/20 at 1pm  Msg from [facility]

## 2020-12-28 ENCOUNTER — Emergency Department
Admission: EM | Admit: 2020-12-28 | Discharge: 2020-12-28 | Disposition: A | Discharge status: Home / Self Care | Payer: BC Managed Care – PPO | Attending: Emergency Medicine | Admitting: Emergency Medicine

## 2020-12-28 ENCOUNTER — Telehealth: Payer: Self-pay | Admitting: Family

## 2020-12-28 ENCOUNTER — Other Ambulatory Visit: Payer: Self-pay

## 2020-12-28 ENCOUNTER — Emergency Department: Payer: BC Managed Care – PPO

## 2020-12-28 DIAGNOSIS — N858 Other specified noninflammatory disorders of uterus: Secondary | ICD-10-CM | POA: Insufficient documentation

## 2020-12-28 DIAGNOSIS — Z87891 Personal history of nicotine dependence: Secondary | ICD-10-CM | POA: Diagnosis not present

## 2020-12-28 DIAGNOSIS — N888 Other specified noninflammatory disorders of cervix uteri: Secondary | ICD-10-CM

## 2020-12-28 DIAGNOSIS — R102 Pelvic and perineal pain: Secondary | ICD-10-CM | POA: Diagnosis not present

## 2020-12-28 DIAGNOSIS — N939 Abnormal uterine and vaginal bleeding, unspecified: Secondary | ICD-10-CM

## 2020-12-28 DIAGNOSIS — R9389 Abnormal findings on diagnostic imaging of other specified body structures: Secondary | ICD-10-CM | POA: Diagnosis not present

## 2020-12-28 DIAGNOSIS — N8500 Endometrial hyperplasia, unspecified: Secondary | ICD-10-CM | POA: Diagnosis not present

## 2020-12-28 LAB — COMPREHENSIVE METABOLIC PANEL
ALT: 15 U/L (ref 0–44)
AST: 17 U/L (ref 15–41)
Albumin: 4.1 g/dL (ref 3.5–5.0)
Alkaline Phosphatase: 63 U/L (ref 38–126)
Anion gap: 8 (ref 5–15)
BUN: 13 mg/dL (ref 6–20)
CO2: 24 mmol/L (ref 22–32)
Calcium: 8.9 mg/dL (ref 8.9–10.3)
Chloride: 104 mmol/L (ref 98–111)
Creatinine, Ser: 0.76 mg/dL (ref 0.44–1.00)
GFR, Estimated: >60 mL/min (ref >60)
Glucose, Bld: 116 mg/dL — ABNORMAL HIGH (ref 70–99)
Potassium: 3.5 mmol/L (ref 3.5–5.1)
Sodium: 136 mmol/L (ref 135–145)
Total Bilirubin: 0.7 mg/dL (ref 0.3–1.2)
Total Protein: 7.5 g/dL (ref 6.5–8.1)

## 2020-12-28 LAB — CBC WITH DIFFERENTIAL/PLATELET
Abs Immature Granulocytes: 0.02 10*3/uL (ref 0.00–0.07)
Basophils Absolute: 0.1 10*3/uL (ref 0.0–0.1)
Basophils Relative: 1 %
Eosinophils Absolute: 0.2 10*3/uL (ref 0.0–0.5)
Eosinophils Relative: 3 %
HCT: 41.1 % (ref 36.0–46.0)
Hemoglobin: 13.9 g/dL (ref 12.0–15.0)
Immature Granulocytes: 0 %
Lymphocytes Relative: 33 %
Lymphs Abs: 2.7 10*3/uL (ref 0.7–4.0)
MCH: 28.1 pg (ref 26.0–34.0)
MCHC: 33.8 g/dL (ref 30.0–36.0)
MCV: 83.2 fL (ref 80.0–100.0)
Monocytes Absolute: 0.6 10*3/uL (ref 0.1–1.0)
Monocytes Relative: 7 %
Neutro Abs: 4.6 10*3/uL (ref 1.7–7.7)
Neutrophils Relative %: 56 %
Platelets: 364 10*3/uL (ref 150–400)
RBC: 4.94 MIL/uL (ref 3.87–5.11)
RDW: 12.9 % (ref 11.5–15.5)
WBC: 8.2 10*3/uL (ref 4.0–10.5)
nRBC: 0 % (ref 0.0–0.2)

## 2020-12-28 LAB — URINALYSIS, ROUTINE W REFLEX MICROSCOPIC
Bacteria, UA: NONE SEEN
Bilirubin Urine: NEGATIVE
Glucose, UA: NEGATIVE mg/dL
Ketones, ur: NEGATIVE mg/dL
Nitrite: NEGATIVE
Protein, ur: 30 mg/dL — AB
RBC / HPF: >50 RBC/hpf — ABNORMAL HIGH (ref 0–5)
Specific Gravity, Urine: 1.021 (ref 1.005–1.030)
pH: 5 (ref 5.0–8.0)

## 2020-12-28 LAB — WET PREP, GENITAL
Clue Cells Wet Prep HPF POC: NONE SEEN
Sperm: NONE SEEN
Trich, Wet Prep: NONE SEEN
Yeast Wet Prep HPF POC: NONE SEEN

## 2020-12-28 LAB — CHLAMYDIA/NGC RT PCR (ARMC ONLY)
Chlamydia Tr: NOT DETECTED
N gonorrhoeae: NOT DETECTED

## 2020-12-28 LAB — PREGNANCY, URINE: Preg Test, Ur: NEGATIVE

## 2020-12-28 MED ORDER — KETOROLAC TROMETHAMINE 60 MG/2ML IM SOLN
15.0000 mg | Freq: Once | INTRAMUSCULAR | Status: AC
  Administered 2020-12-28: 15 mg via INTRAMUSCULAR
  Filled 2020-12-28: qty 2

## 2020-12-28 NOTE — ED Triage Notes (Signed)
First Nurse Note:  C/O pelvic pain and irregular vaginal bleeding.  GYN unable to see patietn today, so referred to eD for eval.  AAOx3. Skin warm and dry. NAD

## 2020-12-28 NOTE — Telephone Encounter (Signed)
Patient calling back from nurse line. She was told to go to the ED. Patient states she can not do that as she has a small child;   Offered Patient available appointments in the [place] locations for today. Patient declines as she states this too far to drive.   Patient will go be seen at the [facility].

## 2020-12-28 NOTE — Telephone Encounter (Signed)
Patient informed, Due to the high volume of calls and your symptoms we have to forward your call to our Triage Nurse to expedient your call. Please hold for the transfer.  Patient transferred to [facility] [place]. Due to having abnormal bleeding and pelvic pain.She has had an STI in the past before and is unsure if her symptoms are related to an STI.No openings in office or virtual.

## 2020-12-28 NOTE — ED Triage Notes (Signed)
See first nurse note- pt to ER with complaints of pelvic pain, reports pain radiates into lower back x8 days. Also concerned for a possible STD. States she had previously been diagnosed with trich 9 years ago and thinks that the infection never went away. Reports she has been googling her symptoms and states that she thinks she may have PID. Reports clear colored discharge and vaginal itching.   Reports that her periods have recently been very heavy, to the point where she has been soaking through tampons hourly. States she started her period yesterday and has been bleeding heavily today. Reports changing her period five times today.

## 2020-12-28 NOTE — Telephone Encounter (Signed)
Patient is heading to [place]. She denied going to ED.

## 2020-12-28 NOTE — ED Provider Notes (Signed)
[facility] Emergency Department Provider Note  ____________________________________________  Time seen: Approximately 9:50 PM  I have reviewed the triage vital signs and the nursing notes.   HISTORY  Chief Complaint Pelvic Pain    HPI Nicole Escobar is a 45 y.o. female with a history of emphysema, substance abuse who comes ED complaining of pelvic pain and vaginal bleeding for the past 24 hours.  Reports that this is an irregular.  Which is, early from her usual timing.  Denies fever or chills, no chest pain or shortness of breath, no trauma.  No purulent discharge.  Pain radiates to the lower back.  No dysuria.  She is in a single partner relationship, no recent sexual activity.    Past Medical History:  Diagnosis Date   Emphysema of lung ([place])    Frequent headaches    unknown onset     Patient Active Problem List   Diagnosis Date Noted   Acute pain of both knees 08/28/2020   Arthralgia 08/28/2020   External hemorrhoid, thrombosed 07/26/2019   GAD (generalized anxiety disorder) 02/08/2018   Lower abdominal pain 02/08/2018   Boil 02/08/2018   Elevated blood pressure reading 02/08/2018   Chest pain 02/08/2018   Left ear pain 11/18/2016   History of cocaine abuse ([place]) 04/03/2016   Influenza-like illness 05/14/2015   Benign paroxysmal positional vertigo 10/17/2014   Hand joint pain 05/16/2014   Bilateral carpal tunnel syndrome 05/16/2014   Stress headaches 03/16/2014     Past Surgical History:  Procedure Laterality Date   TONSILECTOMY/ADENOIDECTOMY WITH MYRINGOTOMY Bilateral    TUBAL LIGATION  2013     Prior to Admission medications   Medication Sig Start Date End Date Taking? Authorizing Provider  albuterol (VENTOLIN HFA) 108 (90 Base) MCG/ACT inhaler Inhale 2 puffs into the lungs every 6 (six) hours as needed for wheezing or shortness of breath. 04/07/19   Copland, Frederico Hamman, MD  amoxicillin (AMOXIL) 500 MG capsule Take 1 capsule (500 mg  total) by mouth 3 (three) times daily. 09/07/20   Montine Circle, PA-C  Cyanocobalamin (B-12) 1000 MCG SUBL Place 1 tablet under the tongue daily. 09/04/20   Flinchum, Kelby Aline, FNP  docusate sodium (COLACE) 100 MG capsule Take 1 capsule (100 mg total) by mouth 2 (two) times daily. Patient not taking: Reported on 08/28/2020 07/26/19   Marval Regal, NP  hydrocortisone (ANUSOL-HC) 25 MG suppository Place 1 suppository (25 mg total) rectally 2 (two) times daily. Patient not taking: Reported on 08/28/2020 07/26/19   Marval Regal, NP  omeprazole (PRILOSEC OTC) 20 MG tablet Take 1 tablet (20 mg total) by mouth daily. 09/12/20 09/12/21  Naaman Plummer, MD  sucralfate (CARAFATE) 1 g tablet Take 1 tablet (1 g total) by mouth 4 (four) times daily for 10 days. 09/12/20 09/22/20  Naaman Plummer, MD     Allergies Sulfa antibiotics   Family History  Problem Relation Age of Onset   Alcohol abuse Mother    Arthritis Mother    Cancer Mother        ovary   Hyperlipidemia Mother    Hypertension Mother    Diabetes Mother    Arthritis Maternal Grandmother    Hypertension Maternal Grandmother    Cancer Maternal Grandmother        colon    Social History Social History   Tobacco Use   Smoking status: Former    Types: Cigarettes    Quit date: 03/25/2003    Years since  quitting: 17.7   Smokeless tobacco: Never  Substance Use Topics   Alcohol use: No    Alcohol/week: 0.0 standard drinks   Drug use: Yes    Frequency: 0.4 times per week    Types: 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     Review of Systems  Constitutional:   No fever or chills.  ENT:   No sore throat. No rhinorrhea. Cardiovascular:   No chest pain or syncope. Respiratory:   No dyspnea or cough. Gastrointestinal:   Positive pelvic pain and vaginal bleeding.  No vomiting constipation or diarrhea Musculoskeletal:   Negative for focal pain or swelling All other systems reviewed and are negative except as documented above  in ROS and HPI.  ____________________________________________   PHYSICAL EXAM:  VITAL SIGNS: ED Triage Vitals  Enc Vitals Group     BP 12/28/20 1509 (!) 167/98     Pulse Rate 12/28/20 1509 71     Resp 12/28/20 1509 18     Temp 12/28/20 1509 98 F (36.7 C)     Temp Source 12/28/20 1509 Oral     SpO2 12/28/20 1509 96 %     Weight 12/28/20 1509 200 lb (90.7 kg)     Height 12/28/20 1509 5\' 4"  (1.626 m)     Head Circumference --      Peak Flow --      Pain Score 12/28/20 1517 8     Pain Loc --      Pain Edu? --      Excl. in [place]? --     Vital signs reviewed, nursing assessments reviewed.   Constitutional:   Alert and oriented. Non-toxic appearance. Eyes:   Conjunctivae are normal. EOMI. PERRL. ENT      Head:   Normocephalic and atraumatic.      Nose:   Wearing a mask.      Mouth/Throat:   Wearing a mask.      Neck:   No meningismus. Full ROM. Hematological/Lymphatic/Immunilogical:   No cervical lymphadenopathy. Cardiovascular:   RRR. Symmetric bilateral radial and DP pulses.  No murmurs. Cap refill less than 2 seconds. Respiratory:   Normal respiratory effort without tachypnea/retractions. Breath sounds are clear and equal bilaterally. No wheezes/rales/rhonchi. Gastrointestinal:   Soft with mild suprapubic tenderness. Non distended. There is no CVA tenderness.  No rebound, rigidity, or guarding. Genitourinary:   Pelvic exam performed with nurse Almyra Free at bedside.  External exam unremarkable.  Speculum exam reveals a small amount of blood in the vaginal vault.  Cervix appears friable and irregular.  Bimanual exam reveals irregular mass of the cervix without CMT or adnexal mass. Musculoskeletal:   Normal range of motion in all extremities. No joint effusions.  No lower extremity tenderness.  No edema. Neurologic:   Normal speech and language.  Motor grossly intact. No acute focal neurologic deficits are appreciated.  Skin:    Skin is warm, dry and intact. No rash noted.  No  petechiae, purpura, or bullae.  ____________________________________________    LABS (pertinent positives/negatives) (all labs ordered are listed, but only abnormal results are displayed) Labs Reviewed  WET PREP, GENITAL - Abnormal; Notable for the following components:      Result Value   WBC, Wet Prep HPF POC RARE (*)    All other components within normal limits  URINALYSIS, ROUTINE W REFLEX MICROSCOPIC - Abnormal; Notable for the following components:   Color, Urine YELLOW (*)    APPearance HAZY (*)  Hgb urine dipstick LARGE (*)    Protein, ur 30 (*)    Leukocytes,Ua TRACE (*)    RBC / HPF >50 (*)    All other components within normal limits  COMPREHENSIVE METABOLIC PANEL - Abnormal; Notable for the following components:   Glucose, Bld 116 (*)    All other components within normal limits  CHLAMYDIA/NGC RT PCR ([facility] ONLY)            CBC WITH DIFFERENTIAL/PLATELET  PREGNANCY, URINE  RPR  HIV ANTIBODY (ROUTINE TESTING W REFLEX)  POC URINE PREG, ED   ____________________________________________   EKG    ____________________________________________    RADIOLOGY  US PELVIC COMPLETE WITH TRANSVAGINAL  Result Date: 12/28/2020 CLINICAL DATA:  Vaginal bleeding. Cervical mass. Patient is premenopausal. EXAM: TRANSABDOMINAL AND TRANSVAGINAL ULTRASOUND OF PELVIS TECHNIQUE: Both transabdominal and transvaginal ultrasound examinations of the pelvis were performed. Transabdominal technique was performed for global imaging of the pelvis including uterus, ovaries, adnexal regions, and pelvic cul-de-sac. It was necessary to proceed with endovaginal exam following the transabdominal exam to visualize the endometrium and bilateral adnexa/ovaries. COMPARISON:  CT abdomen pelvis 04/03/2016 FINDINGS: Uterus Measurements: 8.5 x 4.3 x 5.5 cm = volume: 99 mL. No fibroids visualized. Endometrium Thickness: 15 mm. Borderline thickened with a heterogeneous complex solid region along the lower  uterine endometrial canal. This extends into the cervix and measures up to 33 mm. Right ovary Measurements: 2.8 x 1.412.1 cm = volume: 4.2 mL. Normal appearance/no adnexal mass. Left ovary The left ovary is not visualized. Other findings No abnormal free fluid. IMPRESSION: 1. 37mm heterogeneous vascular cervical mass which extends into the lower uterine endometrial canal. 2. Right ovary not visualized. Electronically Signed   By: Iven Finn M.D.   On: 12/28/2020 20:08    ____________________________________________   PROCEDURES Procedures  ____________________________________________  DIFFERENTIAL DIAGNOSIS   STI, ovarian cyst, uterine fibroid, cervical mass  CLINICAL IMPRESSION / ASSESSMENT AND PLAN / ED COURSE  Medications ordered in the ED: Medications  ketorolac (TORADOL) injection 15 mg (15 mg Intramuscular Given 12/28/20 1828)    Pertinent labs & imaging results that were available during my care of the patient were reviewed by me and considered in my medical decision making (see chart for details).  KARALINE BURESH was evaluated in Emergency Department on 12/28/2020 for the symptoms described in the history of present illness. She was evaluated in the context of the global COVID-19 pandemic, which necessitated consideration that the patient might be at risk for infection with the SARS-CoV-2 virus that causes COVID-19. Institutional protocols and algorithms that pertain to the evaluation of patients at risk for COVID-19 are in a state of rapid change based on information released by regulatory bodies including the CDC and federal and state organizations. These policies and algorithms were followed during the patient's care in the ED.     Clinical Course as of 12/28/20 2150  Fri Dec 28, 2020  1713 Pelvic exam reveals cervical mass.  Will obtain ultrasound. [PS]    Clinical Course User Index [PS] Carrie Mew, MD     ----------------------------------------- 9:54 PM on  12/28/2020 -----------------------------------------  GC chlamydia and wet prep are all normal.  Ultrasound reveals a 33 mm mass of the cervix.  She stable for discharge, I have discussed these results with her and the possibility of cervical cancer and the need to follow-up with gynecology.  ____________________________________________   FINAL CLINICAL IMPRESSION(S) / ED DIAGNOSES    Final diagnoses:  Mass of uterine cervix  ED Discharge Orders     None       Portions of this note were generated with dragon dictation software. Dictation errors may occur despite best attempts at proofreading.    Carrie Mew, MD 12/28/20 2155

## 2020-12-31 ENCOUNTER — Telehealth: Payer: Self-pay | Admitting: Obstetrics and Gynecology

## 2020-12-31 NOTE — Telephone Encounter (Signed)
Seen in ED

## 2020-12-31 NOTE — Telephone Encounter (Signed)
This pt was seen in the ER on 10/7.  When she was released she was told to follow up with you.  I see where ED summary was sent from Dr. Joni Fears.  Can you advise?  Should I schedule this pt since she would be considered a new pt?

## 2020-12-31 NOTE — Telephone Encounter (Signed)
Pt is scheduled on 10/12 in [place].

## 2021-01-02 ENCOUNTER — Encounter: Payer: Self-pay | Admitting: Obstetrics and Gynecology

## 2021-01-02 ENCOUNTER — Ambulatory Visit (INDEPENDENT_AMBULATORY_CARE_PROVIDER_SITE_OTHER): Payer: BC Managed Care – PPO | Admitting: Obstetrics and Gynecology

## 2021-01-02 ENCOUNTER — Other Ambulatory Visit: Payer: Self-pay

## 2021-01-02 ENCOUNTER — Other Ambulatory Visit (HOSPITAL_COMMUNITY)
Admission: RE | Admit: 2021-01-02 | Discharge: 2021-01-02 | Disposition: A | Discharge status: Home / Self Care | Payer: BC Managed Care – PPO | Source: Ambulatory Visit | Attending: Obstetrics and Gynecology | Admitting: Obstetrics and Gynecology

## 2021-01-02 VITALS — BP 123/79 | Ht 64.0 in | Wt 197.0 lb

## 2021-01-02 DIAGNOSIS — N888 Other specified noninflammatory disorders of cervix uteri: Secondary | ICD-10-CM | POA: Insufficient documentation

## 2021-01-02 DIAGNOSIS — Z124 Encounter for screening for malignant neoplasm of cervix: Secondary | ICD-10-CM | POA: Insufficient documentation

## 2021-01-02 NOTE — H&P (View-Only) (Signed)
Obstetrics & Gynecology Office Visit   Chief Complaint:  Chief Complaint  Patient presents with   Follow-up    ED f/u; mass; still spotting and leaking something; has form to release her to have her teeth fixed that was sent over for [place] to fill out.   History of Present Illness: 45 y.o. female [phone] (one who passed away with spinal meningitis, one who was 5 months and miscarried) who presents in follow up from an ER visit on 12/28/2020 for vaginal bleeding. She was noted to have a cervical/lower uterine segment mass with vascularity that measured about 3 cm. She also had pelvic pain.    She had been having left flank pain.  Her last period started on 10/7 and it was supposed to start on 10/15.  Her periods are typically pretty regular, coming monthly, lasting 5 days.  She notes spotting in between, different colors like orange. She states that she is leaking a clear-like fluid, but on her pad the color is orange.  For the past six months she goes through a box of tampons, changing them about every hour.  Now she is using pads and originally was changing them hourly.  The bleeding is tapering off now.    Her last pap smear was 9 years ago.  She doesn't remember whether it was normal, but she thinks probably not. She states that both her mother and grandmother had hysterectomies in their 13s.  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     Past Medical History:  Diagnosis Date   Emphysema of lung ([place])    Frequent headaches    unknown onset    Past Surgical History:  Procedure Laterality Date   DILATION AND CURETTAGE OF UTERUS     TONSILECTOMY/ADENOIDECTOMY WITH MYRINGOTOMY Bilateral    TUBAL LIGATION  2013    Gynecologic History: Patient's last menstrual period was 12/27/2020 (approximate).   Family History  Problem Relation Age of Onset   Alcohol abuse Mother    Arthritis Mother    Cancer Mother        ovary   Hyperlipidemia  Mother    Hypertension Mother    Diabetes Mother    Arthritis Maternal Grandmother    Hypertension Maternal Grandmother    Cancer Maternal Grandmother        colon    Social History   Socioeconomic History   Marital status: Single    Spouse name: Not on file   Number of children: Not on file   Years of education: Not on file   Highest education level: Not on file  Occupational History   Not on file  Tobacco Use   Smoking status: Former    Types: Cigarettes    Quit date: 03/25/2003    Years since quitting: 17.8   Smokeless tobacco: Never  Vaping Use   Vaping Use: Never used  Substance and Sexual Activity   Alcohol use: No    Alcohol/week: 0.0 standard drinks   Drug use: Not Currently    Frequency: 0.4 times per week    Types: 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  56 year old daughter at [place] [employer]   51 year old son   Long-term relationship    GED degree    Social Determinants of Radio broadcast assistant Strain: Not on file  Food Insecurity: Not on file  Transportation Needs: Not on file  Physical Activity: Not on file  Stress: Not on file  Social Connections: Not on file  Intimate Partner Violence: Not on file    Allergies  Allergen Reactions   Sulfa Antibiotics Rash    Prior to Admission medications   Medication Sig Start Date End Date Taking? Authorizing Provider  albuterol (VENTOLIN HFA) 108 (90 Base) MCG/ACT inhaler Inhale 2 puffs into the lungs every 6 (six) hours as needed for wheezing or shortness of breath. 04/07/19   Copland, Frederico Hamman, MD  amoxicillin (AMOXIL) 500 MG capsule Take 1 capsule (500 mg total) by mouth 3 (three) times daily. 09/07/20   Montine Circle, PA-C  Cyanocobalamin (B-12) 1000 MCG SUBL Place 1  tablet under the tongue daily. 09/04/20   Flinchum, Kelby Aline, FNP  omeprazole (PRILOSEC OTC) 20 MG tablet Take 1 tablet (20 mg total) by mouth daily. 09/12/20 09/12/21  Naaman Plummer, MD  sucralfate (CARAFATE) 1 g tablet Take 1 tablet (1 g total) by mouth 4 (four) times daily for 10 days. 09/12/20 09/22/20  Naaman Plummer, MD    Review of Systems  Constitutional: Negative.   HENT: Negative.    Eyes: Negative.   Respiratory: Negative.    Cardiovascular: Negative.   Gastrointestinal: Negative.   Genitourinary: Negative.        See HPI  Musculoskeletal: Negative.   Skin: Negative.   Neurological: Negative.   Psychiatric/Behavioral: Negative.      Physical Exam BP 123/79   Ht 5\' 4"  (1.626 m)   Wt 197 lb (89.4 kg)   LMP 12/27/2020 (Approximate)   BMI 33.81 kg/m  Patient's last menstrual period was 12/27/2020 (approximate). Physical Exam Constitutional:      General: She is not in acute distress.    Appearance: Normal appearance. She is well-developed.  Genitourinary:     Vulva, bladder and urethral meatus normal.     No lesions in the vagina.     Right Labia: No rash, tenderness, lesions, skin changes or Bartholin's cyst.    Left Labia: No tenderness, lesions, skin changes, Bartholin's cyst or rash.    No inguinal adenopathy present in the right or left side.    Pelvic Tanner Score: 5/5.    No vaginal erythema, tenderness or bleeding.      Right Adnexa: not tender, not full and no mass present.    Left Adnexa: not tender, not full and no mass present.    Adnexa exam comments: Exam limited by habitus.     Cervical lesion and polyp present.     No cervical motion tenderness or friability.        Uterus is not enlarged, fixed or tender.     Uterus exam comments: Exam limited by habitus.     Uterus is anteverted.     No urethral tenderness or mass present.     Pelvic exam was performed with patient in the lithotomy position.  HENT:     Head: Normocephalic and atraumatic.   Eyes:     General: No scleral icterus.    Conjunctiva/sclera: Conjunctivae normal.  Cardiovascular:     Rate and Rhythm: Normal rate and regular rhythm.     Heart sounds: No murmur heard.   No  friction rub. No gallop.  Pulmonary:     Effort: Pulmonary effort is normal. No respiratory distress.     Breath sounds: Normal breath sounds. No wheezing or rales.  Abdominal:     General: Bowel sounds are normal. There is no distension.     Palpations: Abdomen is soft. There is no mass.     Tenderness: There is no abdominal tenderness. There is no guarding or rebound.     Hernia: There is no hernia in the left inguinal area or right inguinal area.  Musculoskeletal:        General: Normal range of motion.     Cervical back: Normal range of motion and neck supple.  Lymphadenopathy:     Lower Body: No right inguinal adenopathy. No left inguinal adenopathy.  Neurological:     General: No focal deficit present.     Mental Status: She is alert and oriented to person, place, and time.     Cranial Nerves: No cranial nerve deficit.  Skin:    General: Skin is warm and dry.     Findings: No erythema.  Psychiatric:        Mood and Affect: Mood normal.        Behavior: Behavior normal.        Judgment: Judgment normal.    Female chaperone present for pelvic and breast  portions of the physical exam  Assessment: 45 y.o. No obstetric history on file. female here for  1. Cervical mass   2. Pap smear for cervical cancer screening      Plan: Problem List Items Addressed This Visit   None Visit Diagnoses     Cervical mass    -  Primary   Relevant Orders   Cytology - PAP   Pap smear for cervical cancer screening       Relevant Orders   Cytology - PAP      Will need to try to remove the mass via hysteroscopy, dilation and possible curettage, removal of mass. DIscussed this approach with the patient. The mass is likely to grow and bleeding to become worse.  Will try an EndoLoop to reduce the  mass initially.  SHe agrees to proceed. Pap smear attempted today.  A total of 31 minutes were spent face-to-face with the patient as well as preparation, review, communication, and documentation during this encounter.    Prentice Docker, MD 01/02/2021 3:53 PM

## 2021-01-02 NOTE — Progress Notes (Signed)
Obstetrics & Gynecology Office Visit   Chief Complaint:  Chief Complaint  Patient presents with   Follow-up    ED f/u; mass; still spotting and leaking something; has form to release her to have her teeth fixed that was sent over for [place] to fill out.   History of Present Illness: 45 y.o. female [phone] (one who passed away with spinal meningitis, one who was 5 months and miscarried) who presents in follow up from an ER visit on 12/28/2020 for vaginal bleeding. She was noted to have a cervical/lower uterine segment mass with vascularity that measured about 3 cm. She also had pelvic pain.    She had been having left flank pain.  Her last period started on 10/7 and it was supposed to start on 10/15.  Her periods are typically pretty regular, coming monthly, lasting 5 days.  She notes spotting in between, different colors like orange. She states that she is leaking a clear-like fluid, but on her pad the color is orange.  For the past six months she goes through a box of tampons, changing them about every hour.  Now she is using pads and originally was changing them hourly.  The bleeding is tapering off now.    Her last pap smear was 9 years ago.  She doesn't remember whether it was normal, but she thinks probably not. She states that both her mother and grandmother had hysterectomies in their 96s.  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     Past Medical History:  Diagnosis Date   Emphysema of lung ([place])    Frequent headaches    unknown onset    Past Surgical History:  Procedure Laterality Date   DILATION AND CURETTAGE OF UTERUS     TONSILECTOMY/ADENOIDECTOMY WITH MYRINGOTOMY Bilateral    TUBAL LIGATION  2013    Gynecologic History: Patient's last menstrual period was 12/27/2020 (approximate).   Family History  Problem Relation Age of Onset   Alcohol abuse Mother    Arthritis Mother    Cancer Mother        ovary   Hyperlipidemia  Mother    Hypertension Mother    Diabetes Mother    Arthritis Maternal Grandmother    Hypertension Maternal Grandmother    Cancer Maternal Grandmother        colon    Social History   Socioeconomic History   Marital status: Single    Spouse name: Not on file   Number of children: Not on file   Years of education: Not on file   Highest education level: Not on file  Occupational History   Not on file  Tobacco Use   Smoking status: Former    Types: Cigarettes    Quit date: 03/25/2003    Years since quitting: 17.8   Smokeless tobacco: Never  Vaping Use   Vaping Use: Never used  Substance and Sexual Activity   Alcohol use: No    Alcohol/week: 0.0 standard drinks   Drug use: Not Currently    Frequency: 0.4 times per week    Types: 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  21 year old daughter at [place]   63 year old son   Long-term relationship    GED degree    Social Determinants of Radio broadcast assistant Strain: Not on file  Food Insecurity: Not on file  Transportation Needs: Not on file  Physical Activity: Not on file  Stress: Not on file  Social Connections: Not on file  Intimate Partner Violence: Not on file    Allergies  Allergen Reactions   Sulfa Antibiotics Rash    Prior to Admission medications   Medication Sig Start Date End Date Taking? Authorizing Provider  albuterol (VENTOLIN HFA) 108 (90 Base) MCG/ACT inhaler Inhale 2 puffs into the lungs every 6 (six) hours as needed for wheezing or shortness of breath. 04/07/19   Copland, Frederico Hamman, MD  amoxicillin (AMOXIL) 500 MG capsule Take 1 capsule (500 mg total) by mouth 3 (three) times daily. 09/07/20   Montine Circle, PA-C  Cyanocobalamin (B-12) 1000 MCG SUBL Place 1  tablet under the tongue daily. 09/04/20   Flinchum, Kelby Aline, FNP  omeprazole (PRILOSEC OTC) 20 MG tablet Take 1 tablet (20 mg total) by mouth daily. 09/12/20 09/12/21  Naaman Plummer, MD  sucralfate (CARAFATE) 1 g tablet Take 1 tablet (1 g total) by mouth 4 (four) times daily for 10 days. 09/12/20 09/22/20  Naaman Plummer, MD    Review of Systems  Constitutional: Negative.   HENT: Negative.    Eyes: Negative.   Respiratory: Negative.    Cardiovascular: Negative.   Gastrointestinal: Negative.   Genitourinary: Negative.        See HPI  Musculoskeletal: Negative.   Skin: Negative.   Neurological: Negative.   Psychiatric/Behavioral: Negative.      Physical Exam BP 123/79   Ht 5\' 4"  (1.626 m)   Wt 197 lb (89.4 kg)   LMP 12/27/2020 (Approximate)   BMI 33.81 kg/m  Patient's last menstrual period was 12/27/2020 (approximate). Physical Exam Constitutional:      General: She is not in acute distress.    Appearance: Normal appearance. She is well-developed.  Genitourinary:     Vulva, bladder and urethral meatus normal.     No lesions in the vagina.     Right Labia: No rash, tenderness, lesions, skin changes or Bartholin's cyst.    Left Labia: No tenderness, lesions, skin changes, Bartholin's cyst or rash.    No inguinal adenopathy present in the right or left side.    Pelvic Tanner Score: 5/5.    No vaginal erythema, tenderness or bleeding.      Right Adnexa: not tender, not full and no mass present.    Left Adnexa: not tender, not full and no mass present.    Adnexa exam comments: Exam limited by habitus.     Cervical lesion and polyp present.     No cervical motion tenderness or friability.        Uterus is not enlarged, fixed or tender.     Uterus exam comments: Exam limited by habitus.     Uterus is anteverted.     No urethral tenderness or mass present.     Pelvic exam was performed with patient in the lithotomy position.  HENT:     Head: Normocephalic and atraumatic.   Eyes:     General: No scleral icterus.    Conjunctiva/sclera: Conjunctivae normal.  Cardiovascular:     Rate and Rhythm: Normal rate and regular rhythm.     Heart sounds: No murmur heard.   No  friction rub. No gallop.  Pulmonary:     Effort: Pulmonary effort is normal. No respiratory distress.     Breath sounds: Normal breath sounds. No wheezing or rales.  Abdominal:     General: Bowel sounds are normal. There is no distension.     Palpations: Abdomen is soft. There is no mass.     Tenderness: There is no abdominal tenderness. There is no guarding or rebound.     Hernia: There is no hernia in the left inguinal area or right inguinal area.  Musculoskeletal:        General: Normal range of motion.     Cervical back: Normal range of motion and neck supple.  Lymphadenopathy:     Lower Body: No right inguinal adenopathy. No left inguinal adenopathy.  Neurological:     General: No focal deficit present.     Mental Status: She is alert and oriented to person, place, and time.     Cranial Nerves: No cranial nerve deficit.  Skin:    General: Skin is warm and dry.     Findings: No erythema.  Psychiatric:        Mood and Affect: Mood normal.        Behavior: Behavior normal.        Judgment: Judgment normal.    Female chaperone present for pelvic and breast  portions of the physical exam  Assessment: 46 y.o. No obstetric history on file. female here for  1. Cervical mass   2. Pap smear for cervical cancer screening      Plan: Problem List Items Addressed This Visit   None Visit Diagnoses     Cervical mass    -  Primary   Relevant Orders   Cytology - PAP   Pap smear for cervical cancer screening       Relevant Orders   Cytology - PAP      Will need to try to remove the mass via hysteroscopy, dilation and possible curettage, removal of mass. DIscussed this approach with the patient. The mass is likely to grow and bleeding to become worse.  Will try an EndoLoop to reduce the  mass initially.  SHe agrees to proceed. Pap smear attempted today.  A total of 31 minutes were spent face-to-face with the patient as well as preparation, review, communication, and documentation during this encounter.    Prentice Docker, MD 01/02/2021 3:53 PM

## 2021-01-07 ENCOUNTER — Encounter: Payer: Self-pay | Admitting: Obstetrics and Gynecology

## 2021-01-08 ENCOUNTER — Telehealth: Payer: Self-pay

## 2021-01-08 LAB — CYTOLOGY - PAP
Comment: NEGATIVE
Comment: NEGATIVE
HPV 16: POSITIVE — AB
HPV 18 / 45: NEGATIVE
High risk HPV: POSITIVE — AB

## 2021-01-08 NOTE — Telephone Encounter (Signed)
Called patient to schedule hysteroscopy, dilation and curettage, polypectomy w Kendra Opitz 11/8  H&P - coming in for biopsy 10/26    Pre-admit phone call appointment to be requested - date and time will be included on H&P paper work. Also all appointments will be updated on pt MyChart. Explained that this appointment has a call window. Based on the time scheduled will indicate if the call will be received within a 4 hour window before 1:00 or after.  Advised that pt may also receive calls from the hospital pharmacy and pre-service center.  Confirmed pt has BCBS as Chartered certified accountant. No secondary insurance.

## 2021-01-08 NOTE — Telephone Encounter (Signed)
-----   Message from Will Bonnet, MD sent at 01/02/2021  4:15 PM EDT ----- Regarding: Schedule surgery Surgery Booking Request Patient Full Name:  Nicole Escobar  MRN: [id number]  DOB: [birth year]  Surgeon: Prentice Docker, MD  Requested Surgery Date and Time: TBD Primary Diagnosis AND Code: cervical mass [N88.8] Secondary Diagnosis and Code:  Surgical Procedure: hysteroscopy, dilation and curettage, polypectomy RNFA Requested?: No L&D Notification: No Admission Status: same day surgery Length of Surgery: 75 min Special Case Needs: endoloop, myosure H&P: No Phone Interview???:  Yes Interpreter: No Medical Clearance:  No Special Scheduling Instructions: No Any known health/anesthesia issues, diabetes, sleep apnea, latex allergy, defibrillator/pacemaker?: No Acuity: P3   (P1 highest, P2 delay may cause harm, P3 low, elective gyn, P4 lowest) Post op follow up visits: 2 weeks post-op

## 2021-01-16 ENCOUNTER — Encounter: Payer: Self-pay | Admitting: Obstetrics and Gynecology

## 2021-01-16 ENCOUNTER — Ambulatory Visit (INDEPENDENT_AMBULATORY_CARE_PROVIDER_SITE_OTHER): Payer: BC Managed Care – PPO | Admitting: Obstetrics and Gynecology

## 2021-01-16 ENCOUNTER — Other Ambulatory Visit: Payer: Self-pay

## 2021-01-16 ENCOUNTER — Other Ambulatory Visit (HOSPITAL_COMMUNITY)
Admission: RE | Admit: 2021-01-16 | Discharge: 2021-01-16 | Disposition: A | Discharge status: Home / Self Care | Payer: BC Managed Care – PPO | Source: Ambulatory Visit | Attending: Obstetrics and Gynecology | Admitting: Obstetrics and Gynecology

## 2021-01-16 ENCOUNTER — Encounter
Admission: RE | Admit: 2021-01-16 | Discharge: 2021-01-16 | Disposition: A | Discharge status: Home / Self Care | Payer: BC Managed Care – PPO | Source: Ambulatory Visit | Attending: Obstetrics and Gynecology | Admitting: Obstetrics and Gynecology

## 2021-01-16 VITALS — BP 126/88 | Ht 64.0 in

## 2021-01-16 DIAGNOSIS — N888 Other specified noninflammatory disorders of cervix uteri: Secondary | ICD-10-CM | POA: Insufficient documentation

## 2021-01-16 DIAGNOSIS — N86 Erosion and ectropion of cervix uteri: Secondary | ICD-10-CM | POA: Diagnosis not present

## 2021-01-16 DIAGNOSIS — R87619 Unspecified abnormal cytological findings in specimens from cervix uteri: Secondary | ICD-10-CM

## 2021-01-16 NOTE — Progress Notes (Signed)
   HPI:  Nicole Escobar is a 45 y.o.  who presents today for evaluation and management of abnormal cervical cytology.    Dysplasia History:  01/02/21 - atypical glandular cells, favor neoplastic (primary differential diagnosis includes endocervical adenocarcinoma in situ), HPV 16 positive.   Physical Exam: -Vitals:  BP 126/88   Ht 5\' 4"  (1.626 m)   LMP 12/27/2020 (Approximate)   BMI 33.81 kg/m  GEN: WD, WN, NAD.  A+ O x 3, good mood and affect. ABD:  NT, ND.  Soft, no masses.  No hernias noted.   Pelvic:   Vulva: Normal appearance.  No lesions.  Vagina: No lesions or abnormalities noted.  Support: Normal pelvic support.  Urethra No masses tenderness or scarring.  Meatus Normal size without lesions or prolapse.  Cervix: See below.  Anus: Normal exam.  No lesions.  Perineum: Normal exam.  No lesions.        Bimanual   Uterus: Normal size.  Non-tender.  Mobile.  AV.  Adnexae: No masses.  Non-tender to palpation.  Cul-de-sac: Negative for abnormality.   PROCEDURE: 1.  Urine Pregnancy Test:  not done 2. Mass protruding from cervix stained with betadine x 1.  Mass visibly measures about 4-5 cm laterally, 3-4 cm in the AP dimension. It is protruding from the cervical os by several centimeters.  3. Tischler forceps used to obtain 3 biopsies (left side, right side, middle).  Adequate tissue noted within the Emlyn forceps.   4. Hemostasis obtained with Monsel's solution. 5. The patient tolerated the procedure well.                         ASSESSMENT:  Nicole Escobar is a 45 y.o. No obstetric history on file. here for  1. Cervical mass   2. 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  PLAN: Biopsies obtained today. Will follow up accordingly.      Prentice Docker, MD  [facility] 01/16/2021  1:42 PM

## 2021-01-16 NOTE — Patient Instructions (Signed)
Your procedure is scheduled on:  Report to DAY SURGERY DEPARTMENT LOCATED ON 2ND FLOOR MEDICAL MALL ENTRANCE. To find out your arrival time please call [phone] between 1PM - 3PM on .  Remember: Instructions that are not followed completely may result in serious medical risk, up to and including death, or upon the discretion of your surgeon and anesthesiologist your surgery may need to be rescheduled.     _X__ 1. Do not eat food after midnight the night before your procedure.                 No gum chewing or hard candies. You may drink clear liquids up to 2 hours                 before you are scheduled to arrive for your surgery- DO not drink clear                 liquids within 2 hours of the start of your surgery.                 Clear Liquids include:  water, apple juice without pulp, clear carbohydrate                 drink such as Clearfast or Gatorade, Black Coffee or Tea (Do not add                 anything to coffee or tea). Diabetics water only  __X__2.  On the morning of surgery brush your teeth with toothpaste and water, you                 may rinse your mouth with mouthwash if you wish.  Do not swallow any              toothpaste of mouthwash.     _X__ 3.  No Alcohol for 24 hours before or after surgery.   _X__ 4.  Do Not Smoke or use e-cigarettes For 24 Hours Prior to Your Surgery.                 Do not use any chewable tobacco products for at least 6 hours prior to                 surgery.  ____  5.  Bring all medications with you on the day of surgery if instructed.   __X__  6.  Notify your doctor if there is any change in your medical condition      (cold, fever, infections).     Do not wear jewelry, make-up, hairpins, clips or nail polish. Do not wear lotions, powders, or perfumes.  Do not shave 48 hours prior to surgery. Men may shave face and neck. Do not bring valuables to the hospital.    [facility] is not responsible for any belongings or  valuables.  Contacts, dentures/partials or body piercings may not be worn into surgery. Bring a case for your contacts, glasses or hearing aids, a denture cup will be supplied. Leave your suitcase in the car. After surgery it may be brought to your room. For patients admitted to the hospital, discharge time is determined by your treatment team.   Patients discharged the day of surgery will not be allowed to drive home.   Please read over the following fact sheets that you were given:   MRSA Information  __X__ Take these medicines the morning of surgery with A SIP OF WATER:      1.   2.   3.   4.  5.  6.  ____ Fleet Enema (as directed)   __X__ Use CHG Soap/SAGE wipes as directed  ____ Use inhalers on the day of surgery  ____ Stop metformin/Janumet/Farxiga 2 days prior to surgery    ____ Take 1/2 of usual insulin dose the night before surgery. No insulin the morning          of surgery.   ____ Stop Blood Thinners Coumadin/Plavix/Xarelto/Pleta/Pradaxa/Eliquis/Effient/Aspirin  on   Or contact your Surgeon, Cardiologist or Medical Doctor regarding  ability to stop your blood thinners  __X__ Stop Anti-inflammatories 7 days before surgery such as Advil, Ibuprofen, Motrin,  BC or Goodies Powder, Naprosyn, Naproxen, Aleve, Aspirin    __X__ Stop all herbal supplements, fish oil or vitamin E until after surgery.    ____ Bring C-Pap to the hospital.      

## 2021-01-22 ENCOUNTER — Telehealth: Payer: Self-pay

## 2021-01-22 LAB — SURGICAL PATHOLOGY

## 2021-01-22 NOTE — Telephone Encounter (Signed)
Pt calling for status of bx.  [phone]  Adv pt bx isn't back yet; it can take two weeks before we get them back.  Pt is asking if she still should show up for surgery next week if bx isn't back? Reschedule surgery?  What to do?  Pt aware SDJ in OR today and msg will be sent to him.

## 2021-01-23 ENCOUNTER — Telehealth: Payer: Self-pay

## 2021-01-23 NOTE — Telephone Encounter (Signed)
Patient is requesting a call to over results. Please advise patient is aware SDJ is out of office .

## 2021-01-24 ENCOUNTER — Telehealth: Payer: Self-pay | Admitting: Obstetrics and Gynecology

## 2021-01-24 NOTE — Telephone Encounter (Signed)
Discussed biopsy results with patient.  The plan will be to proceed with the polypectomy and hysteroscopy and D&C next week.  We discussed that she did have an abnormal Pap smear and this will require a colposcopy.  Given the large polyp and the fact that it makes colposcopy very difficult, once she has recovered from her polypectomy she will need a colposcopy.  It is very important that she completes both steps to make sure that there is no cancer in her cervix or uterus or polyp.  She voiced understanding of all of the above and agreement to proceed.  We also discussed that I will not be here after December 2.  Therefore, she will likely need a different doctor within the practice to perform the colposcopy.  She voiced understanding of this, as well.

## 2021-01-24 NOTE — Telephone Encounter (Signed)
Patient calling to speak with SDJ about results and has questions about her surgery. Patient states she would like to know she need to tell her employer.

## 2021-01-28 ENCOUNTER — Other Ambulatory Visit: Payer: Self-pay

## 2021-01-28 ENCOUNTER — Encounter
Admission: RE | Admit: 2021-01-28 | Discharge: 2021-01-28 | Disposition: A | Discharge status: Home / Self Care | Payer: BC Managed Care – PPO | Source: Ambulatory Visit | Attending: Obstetrics and Gynecology | Admitting: Obstetrics and Gynecology

## 2021-01-28 DIAGNOSIS — N888 Other specified noninflammatory disorders of cervix uteri: Secondary | ICD-10-CM | POA: Insufficient documentation

## 2021-01-28 DIAGNOSIS — Z01812 Encounter for preprocedural laboratory examination: Secondary | ICD-10-CM | POA: Insufficient documentation

## 2021-01-28 DIAGNOSIS — Z87891 Personal history of nicotine dependence: Secondary | ICD-10-CM | POA: Diagnosis not present

## 2021-01-28 DIAGNOSIS — J449 Chronic obstructive pulmonary disease, unspecified: Secondary | ICD-10-CM | POA: Diagnosis not present

## 2021-01-28 DIAGNOSIS — N921 Excessive and frequent menstruation with irregular cycle: Secondary | ICD-10-CM | POA: Diagnosis not present

## 2021-01-28 DIAGNOSIS — D261 Other benign neoplasm of corpus uteri: Secondary | ICD-10-CM | POA: Diagnosis not present

## 2021-01-28 LAB — TYPE AND SCREEN
ABO/RH(D): B POS
Antibody Screen: NEGATIVE

## 2021-01-29 ENCOUNTER — Ambulatory Visit: Payer: BC Managed Care – PPO

## 2021-01-29 ENCOUNTER — Ambulatory Visit
Admission: RE | Admit: 2021-01-29 | Discharge: 2021-01-29 | Disposition: A | Discharge status: Home / Self Care | Payer: BC Managed Care – PPO | Attending: Obstetrics and Gynecology | Admitting: Obstetrics and Gynecology

## 2021-01-29 ENCOUNTER — Other Ambulatory Visit: Payer: Self-pay

## 2021-01-29 ENCOUNTER — Encounter: Payer: Self-pay | Admitting: Obstetrics and Gynecology

## 2021-01-29 ENCOUNTER — Encounter
Admission: RE | Disposition: A | Discharge status: Home / Self Care | Payer: Self-pay | Source: Home / Self Care | Attending: Obstetrics and Gynecology

## 2021-01-29 DIAGNOSIS — J449 Chronic obstructive pulmonary disease, unspecified: Secondary | ICD-10-CM | POA: Insufficient documentation

## 2021-01-29 DIAGNOSIS — N858 Other specified noninflammatory disorders of uterus: Secondary | ICD-10-CM | POA: Diagnosis not present

## 2021-01-29 DIAGNOSIS — D260 Other benign neoplasm of cervix uteri: Secondary | ICD-10-CM | POA: Diagnosis not present

## 2021-01-29 DIAGNOSIS — Z87891 Personal history of nicotine dependence: Secondary | ICD-10-CM | POA: Diagnosis not present

## 2021-01-29 DIAGNOSIS — N889 Noninflammatory disorder of cervix uteri, unspecified: Secondary | ICD-10-CM | POA: Diagnosis not present

## 2021-01-29 DIAGNOSIS — N888 Other specified noninflammatory disorders of cervix uteri: Secondary | ICD-10-CM

## 2021-01-29 DIAGNOSIS — D261 Other benign neoplasm of corpus uteri: Secondary | ICD-10-CM | POA: Insufficient documentation

## 2021-01-29 DIAGNOSIS — N921 Excessive and frequent menstruation with irregular cycle: Secondary | ICD-10-CM | POA: Diagnosis not present

## 2021-01-29 HISTORY — PX: DILATATION & CURETTAGE/HYSTEROSCOPY WITH MYOSURE: SHX6511

## 2021-01-29 LAB — POCT PREGNANCY, URINE: Preg Test, Ur: NEGATIVE

## 2021-01-29 LAB — ABO/RH: ABO/RH(D): B POS

## 2021-01-29 SURGERY — DILATATION & CURETTAGE/HYSTEROSCOPY WITH MYOSURE
Anesthesia: General

## 2021-01-29 MED ORDER — DEXMEDETOMIDINE (PRECEDEX) IN NS 20 MCG/5ML (4 MCG/ML) IV SYRINGE
PREFILLED_SYRINGE | INTRAVENOUS | Status: DC | PRN
  Administered 2021-01-29 (×5): 4 ug via INTRAVENOUS

## 2021-01-29 MED ORDER — FENTANYL CITRATE (PF) 100 MCG/2ML IJ SOLN: 25.0000 ug | INTRAMUSCULAR | Status: DC | PRN

## 2021-01-29 MED ORDER — OXYCODONE HCL 5 MG PO TABS
5.0000 mg | ORAL_TABLET | Freq: Once | ORAL | Status: AC | PRN
  Administered 2021-01-29: 5 mg via ORAL

## 2021-01-29 MED ORDER — ACETAMINOPHEN 10 MG/ML IV SOLN
INTRAVENOUS | Status: DC | PRN
  Administered 2021-01-29: 1000 mg via INTRAVENOUS

## 2021-01-29 MED ORDER — IBUPROFEN 600 MG PO TABS: 600.0000 mg | ORAL_TABLET | Freq: Four times a day (QID) | ORAL | 0 refills | Status: DC | PRN

## 2021-01-29 MED ORDER — LACTATED RINGERS IV SOLN: INTRAVENOUS | Status: DC

## 2021-01-29 MED ORDER — HYDROCODONE-ACETAMINOPHEN 5-325 MG PO TABS: 1.0000 | ORAL_TABLET | Freq: Three times a day (TID) | ORAL | 0 refills | Status: DC | PRN

## 2021-01-29 MED ORDER — LIDOCAINE HCL (PF) 2 % IJ SOLN
INTRAMUSCULAR | Status: AC
  Filled 2021-01-29: qty 5

## 2021-01-29 MED ORDER — MIDAZOLAM HCL 2 MG/2ML IJ SOLN
INTRAMUSCULAR | Status: DC | PRN
  Administered 2021-01-29: 2 mg via INTRAVENOUS

## 2021-01-29 MED ORDER — SILVER NITRATE-POT NITRATE 75-25 % EX MISC
CUTANEOUS | Status: AC
  Filled 2021-01-29: qty 10

## 2021-01-29 MED ORDER — DEXAMETHASONE SODIUM PHOSPHATE 10 MG/ML IJ SOLN
INTRAMUSCULAR | Status: AC
  Filled 2021-01-29: qty 1

## 2021-01-29 MED ORDER — MIDAZOLAM HCL 2 MG/2ML IJ SOLN
INTRAMUSCULAR | Status: AC
  Filled 2021-01-29: qty 2

## 2021-01-29 MED ORDER — PHENYLEPHRINE HCL (PRESSORS) 10 MG/ML IV SOLN
INTRAVENOUS | Status: DC | PRN
  Administered 2021-01-29: 40 ug via INTRAVENOUS
  Administered 2021-01-29: 80 ug via INTRAVENOUS
  Administered 2021-01-29: 40 ug via INTRAVENOUS
  Administered 2021-01-29: 80 ug via INTRAVENOUS

## 2021-01-29 MED ORDER — PROPOFOL 500 MG/50ML IV EMUL
INTRAVENOUS | Status: AC
  Filled 2021-01-29: qty 50

## 2021-01-29 MED ORDER — OXYCODONE HCL 5 MG PO TABS
ORAL_TABLET | ORAL | Status: AC
  Filled 2021-01-29: qty 1

## 2021-01-29 MED ORDER — 0.9 % SODIUM CHLORIDE (POUR BTL) OPTIME
TOPICAL | Status: DC | PRN
  Administered 2021-01-29: 100 mL

## 2021-01-29 MED ORDER — PROPOFOL 10 MG/ML IV BOLUS
INTRAVENOUS | Status: DC | PRN
  Administered 2021-01-29: 30 mg via INTRAVENOUS

## 2021-01-29 MED ORDER — ONDANSETRON HCL 4 MG/2ML IJ SOLN
INTRAMUSCULAR | Status: AC
  Filled 2021-01-29: qty 2

## 2021-01-29 MED ORDER — SILVER NITRATE-POT NITRATE 75-25 % EX MISC
CUTANEOUS | Status: DC | PRN
  Administered 2021-01-29: 3

## 2021-01-29 MED ORDER — ONDANSETRON HCL 4 MG/2ML IJ SOLN: 4.0000 mg | Freq: Once | INTRAMUSCULAR | Status: DC | PRN

## 2021-01-29 MED ORDER — OXYCODONE HCL 5 MG/5ML PO SOLN: 5.0000 mg | Freq: Once | ORAL | Status: AC | PRN

## 2021-01-29 MED ORDER — PROPOFOL 500 MG/50ML IV EMUL
INTRAVENOUS | Status: DC | PRN
  Administered 2021-01-29: 150 ug/kg/min via INTRAVENOUS

## 2021-01-29 MED ORDER — LIDOCAINE HCL (CARDIAC) PF 100 MG/5ML IV SOSY
PREFILLED_SYRINGE | INTRAVENOUS | Status: DC | PRN
  Administered 2021-01-29: 100 mg via INTRAVENOUS

## 2021-01-29 MED ORDER — SUCCINYLCHOLINE CHLORIDE 200 MG/10ML IV SOSY
PREFILLED_SYRINGE | INTRAVENOUS | Status: AC
  Filled 2021-01-29: qty 10

## 2021-01-29 MED ORDER — CHLORHEXIDINE GLUCONATE 0.12 % MT SOLN
OROMUCOSAL | Status: AC
  Filled 2021-01-29: qty 15

## 2021-01-29 MED ORDER — FENTANYL CITRATE (PF) 100 MCG/2ML IJ SOLN
INTRAMUSCULAR | Status: DC | PRN
  Administered 2021-01-29: 50 ug via INTRAVENOUS
  Administered 2021-01-29 (×2): 25 ug via INTRAVENOUS

## 2021-01-29 MED ORDER — ONDANSETRON HCL 4 MG/2ML IJ SOLN
INTRAMUSCULAR | Status: DC | PRN
  Administered 2021-01-29: 4 mg via INTRAVENOUS

## 2021-01-29 MED ORDER — ACETAMINOPHEN 10 MG/ML IV SOLN: 1000.0000 mg | Freq: Once | INTRAVENOUS | Status: DC | PRN

## 2021-01-29 MED ORDER — ACETAMINOPHEN 10 MG/ML IV SOLN
INTRAVENOUS | Status: AC
  Filled 2021-01-29: qty 100

## 2021-01-29 MED ORDER — FENTANYL CITRATE (PF) 100 MCG/2ML IJ SOLN
INTRAMUSCULAR | Status: AC
  Filled 2021-01-29: qty 2

## 2021-01-29 MED ORDER — PROPOFOL 10 MG/ML IV BOLUS
INTRAVENOUS | Status: AC
  Filled 2021-01-29: qty 20

## 2021-01-29 MED ORDER — DEXAMETHASONE SODIUM PHOSPHATE 10 MG/ML IJ SOLN
INTRAMUSCULAR | Status: DC | PRN
  Administered 2021-01-29: 10 mg via INTRAVENOUS

## 2021-01-29 MED ORDER — SODIUM CHLORIDE 0.9 % IR SOLN
Status: DC | PRN
  Administered 2021-01-29: 7500 mL

## 2021-01-29 SURGICAL SUPPLY — 37 items
BAG DRN RND TRDRP ANRFLXCHMBR (UROLOGICAL SUPPLIES)
BAG URINE DRAIN 2000ML AR STRL (UROLOGICAL SUPPLIES) IMPLANT
BLADE BOVIE TIP EXT 4 (BLADE) ×2 IMPLANT
CATH FOLEY 2WAY  5CC 16FR (CATHETERS)
CATH FOLEY 2WAY 5CC 16FR (CATHETERS)
CATH URTH 16FR FL 2W BLN LF (CATHETERS) IMPLANT
DEVICE MYOSURE LITE (MISCELLANEOUS) ×1 IMPLANT
DEVICE MYOSURE REACH (MISCELLANEOUS) ×1 IMPLANT
DRSG TELFA 3X8 NADH (GAUZE/BANDAGES/DRESSINGS) ×3 IMPLANT
ELECT BLADE 4.0 EZ CLEAN MEGAD (MISCELLANEOUS) ×3
ELECT REM PT RETURN 9FT ADLT (ELECTROSURGICAL) ×3
ELECTRODE BLDE 4.0 EZ CLN MEGD (MISCELLANEOUS) IMPLANT
ELECTRODE REM PT RTRN 9FT ADLT (ELECTROSURGICAL) ×1 IMPLANT
GAUZE 4X4 16PLY ~~LOC~~+RFID DBL (SPONGE) ×3 IMPLANT
GLOVE SURG ENC MOIS LTX SZ7 (GLOVE) ×3 IMPLANT
GLOVE SURG UNDER POLY LF SZ7.5 (GLOVE) ×3 IMPLANT
GOWN STRL REUS W/ TWL LRG LVL3 (GOWN DISPOSABLE) ×2 IMPLANT
GOWN STRL REUS W/TWL LRG LVL3 (GOWN DISPOSABLE) ×6
HANDLE YANKAUER SUCT BULB TIP (MISCELLANEOUS) ×3 IMPLANT
IV NS IRRIG 3000ML ARTHROMATIC (IV SOLUTION) ×7 IMPLANT
KIT PROCEDURE FLUENT (KITS) IMPLANT
KIT TURNOVER CYSTO (KITS) ×3 IMPLANT
LOOP CUT BIPOLAR 24F LRG (ELECTROSURGICAL) ×2 IMPLANT
LOOP SUT CHROMIC 0 SGL3 (SUTURE) ×3 IMPLANT
MANIFOLD NEPTUNE II (INSTRUMENTS) ×3 IMPLANT
PACK DNC HYST (MISCELLANEOUS) ×3 IMPLANT
PAD DRESSING TELFA 3X8 NADH (GAUZE/BANDAGES/DRESSINGS) IMPLANT
PAD OB MATERNITY 4.3X12.25 (PERSONAL CARE ITEMS) ×3 IMPLANT
PAD PREP 24X41 OB/GYN DISP (PERSONAL CARE ITEMS) ×3 IMPLANT
PENCIL ELECTRO HAND CTR (MISCELLANEOUS) ×2 IMPLANT
SCRUB EXIDINE 4% CHG 4OZ (MISCELLANEOUS) ×3 IMPLANT
SEAL ROD LENS SCOPE MYOSURE (ABLATOR) ×3 IMPLANT
SET IRRIG Y TYPE TUR BLADDER L (SET/KITS/TRAYS/PACK) ×2 IMPLANT
SURGILUBE 2OZ TUBE FLIPTOP (MISCELLANEOUS) ×3 IMPLANT
TUBING CONNECTING 10 (TUBING) ×3 IMPLANT
TUBING CONNECTING 10' (TUBING) ×2
WATER STERILE IRR 500ML POUR (IV SOLUTION) ×3 IMPLANT

## 2021-01-29 NOTE — Transfer of Care (Signed)
Immediate Anesthesia Transfer of Care Note  Patient: Nicole Escobar  Procedure(s) Performed: DILATATION & CURETTAGE/HYSTEROSCOPY WITH MYOSURE  Patient Location: PACU  Anesthesia Type:General  Level of Consciousness: awake and alert   Airway & Oxygen Therapy: Patient Spontanous Breathing and Patient connected to face mask oxygen  Post-op Assessment: Report given to RN, Post -op Vital signs reviewed and stable and Patient moving all extremities  Post vital signs: Reviewed and stable  Last Vitals:  Vitals Value Taken Time  BP 116/74 01/29/21 1245  Temp    Pulse 60 01/29/21 1246  Resp 16 01/29/21 1246  SpO2 100 % 01/29/21 1246  Vitals shown include unvalidated device data.  Last Pain:  Vitals:   01/29/21 0842  TempSrc: Temporal  PainSc: 3          Complications: No notable events documented.

## 2021-01-29 NOTE — Discharge Instructions (Signed)
AMBULATORY SURGERY  ?DISCHARGE INSTRUCTIONS ? ? ?The drugs that you were given will stay in your system until tomorrow so for the next 24 hours you should not: ? ?Drive an automobile ?Make any legal decisions ?Drink any alcoholic beverage ? ? ?You may resume regular meals tomorrow.  Today it is better to start with liquids and gradually work up to solid foods. ? ?You may eat anything you prefer, but it is better to start with liquids, then soup and crackers, and gradually work up to solid foods. ? ? ?Please notify your doctor immediately if you have any unusual bleeding, trouble breathing, redness and pain at the surgery site, drainage, fever, or pain not relieved by medication. ? ? ? ?Additional Instructions: ? ? ? ?Please contact your physician with any problems or Same Day Surgery at [phone], Monday through Friday 6 am to 4 pm, or [place] [facility] number at [phone].  ?

## 2021-01-29 NOTE — Anesthesia Preprocedure Evaluation (Signed)
Anesthesia Evaluation  Patient identified by MRN, date of birth, ID band Patient awake    Reviewed: Allergy & Precautions, NPO status , Patient's Chart, lab work & pertinent test results  History of Anesthesia Complications Negative for: history of anesthetic complications  Airway Mallampati: I   Neck ROM: Full    Dental  (+) Poor Dentition   Pulmonary COPD, former smoker (quit 2005),    Pulmonary exam normal breath sounds clear to auscultation       Cardiovascular Normal cardiovascular exam Rhythm:Regular Rate:Normal  ECG 09/12/20: normal   Neuro/Psych  Headaches,    GI/Hepatic negative GI ROS,   Endo/Other  negative endocrine ROS  Renal/GU negative Renal ROS     Musculoskeletal   Abdominal   Peds  Hematology negative hematology ROS (+)   Anesthesia Other Findings   Reproductive/Obstetrics                             Anesthesia Physical Anesthesia Plan  ASA: 3  Anesthesia Plan: General   Post-op Pain Management:    Induction: Intravenous  PONV Risk Score and Plan: 3 and Propofol infusion, TIVA and Treatment may vary due to age or medical condition  Airway Management Planned: Natural Airway  Additional Equipment:   Intra-op Plan:   Post-operative Plan:   Informed Consent: I have reviewed the patients History and Physical, chart, labs and discussed the procedure including the risks, benefits and alternatives for the proposed anesthesia with the patient or authorized representative who has indicated his/her understanding and acceptance.     Dental advisory given  Plan Discussed with: CRNA  Anesthesia Plan Comments: (LMA/GETA backup.  Informed patient about role of CRNA in peri- and intra-operative care.)        Anesthesia Quick Evaluation

## 2021-01-29 NOTE — OR Nursing (Signed)
Normal saline used for Hysteroscopy. Total fluid used 7,500 mLs. 7,350 mLs out. 150 mLs deficit. Dr. Glennon Mac aware.   Fuller Mandril, RN

## 2021-01-29 NOTE — Interval H&P Note (Signed)
History and Physical Interval Note:  01/29/2021 9:51 AM  Nicole Escobar  has presented today for surgery, with the diagnosis of cerviacl mass N88.8.  The various methods of treatment have been discussed with the patient and family. After consideration of risks, benefits and other options for treatment, the patient has consented to  Procedure(s): [place] (N/A) and removal of cervical/uterine mass as a surgical intervention.  The patient's history has been reviewed, patient examined, no change in status, stable for surgery.  I have reviewed the patient's chart and labs.  Questions were answered to the patient's satisfaction.    She had an abnormal pap smear that did not state that the lesion or she had cancer of her cervix. She did undergo a biopsy of the mass in three different locations on the mass, the pathology of which suggested benign polyp. So, we will proceed with the above procedure.  We did discuss that she will still need a follow up on her abnormal pap smear and likely a colposcopy of her cervix, which can't be done at this time due to the enormity of her polypoid lesion which nearly completely blocks any adequate visualization of the cervix.  She has voiced understanding of the this and agrees to follow up for cervical evaluation after this procedure once she has healed.    Prentice Docker, MD, [facility], [facility] 01/29/2021 9:54 AM

## 2021-01-29 NOTE — Op Note (Signed)
  Operative Note    Name: Nicole Escobar  Date of Service: 01/29/2021  DOB: 1975/12/04  MRN: [id number]   Pre-Operative Diagnosis:  1) Uterine Mass [N85.5] 2) Menorrhagia with irregular cycle [N92.1]  Post-Operative Diagnosis:  1) Uterine Mass [N85.5] 2) Menorrhagia with irregular cycle [N92.1]  Procedures:  1. Hysteroscopy, dilation and curettage 2. Endometrial polypectomy of enlarged polypoid lesion prolapsing through cervix  Primary Surgeon: Prentice Docker, MD   EBL: 100 mL   IVF: 500 mL   Urine output: 70 mL  Fluid Deficit: 150 mL  Specimens: uterine polypoid mass with endometrial curettings.  Drains: none  Complications: None   Disposition: PACU   Condition: Stable   Findings:  1) Enlarged polypoid mass prolapsing through cervix, measuring about 4-5 x 4 cm in the AP and lateral dimensions 2) otherwise normal-appearing endometrial cavity 3) cervix without apparent lesions though significantly dilated by mass  Procedure Summary:  After informed consent was obtained, the patient was taken to the operating room where anesthesia was obtained without difficulty. The patient was positioned in the dorsal lithotomy position in [place].  The patient's bladder was catheterized with an in and out foley catheter.  The patient was examined under anesthesia, with the above noted findings.  The bi-valved speculum was placed inside the patient's vagina, and the the anterior lip of the cervix was seen and grasped with the tenaculum.  A  The Endoloop was utilized to attempt to get to the base of the polypoid lesion.  After cinching down the loop, cold scissors were utilized to remove the visible polypoid lesion.  A second Endoloop was required to obtain a better seal around the base of the polypoid stalk.  More visible lesion was removed.  The hysteroscope was introduced through the cervix.  Due to leakage of fluid around the hysteroscope through the cervix, the cervix was  grasped with a single-tooth tenaculum to around the hysteroscope to reduce loss of fluid.  Within the uterine cavity the bilateral tubal ostia were visualized.  The rest of the cavity appeared normal.  Posteriorly there appeared to be a broad-based lesion.  This was carefully resected using the bipolar resectoscope.  This allowed the use of a saline-based isotonic solution during the procedure.  Once as much of the polypoid lesion was resected and hemostasis was obtained the hysteroscope was removed.  A curettage was performed until a gritty texture was noted throughout.  The hysteroscope was reintroduced through the cervix and using bipolar coagulation cautery any areas of bleeding were cauterized.  The uterus and cervix were visualized to be hemostatic.  The polypoid lesion was removed in its entirety.  The hysteroscope was removed.  The single-tooth tenacula were removed from the cervix.  Silver nitrate was utilized to gain hemostasis at the tenaculum entry sites.  The cervix was monitored for about 10 minutes to verify that no bleeding was occurring from the cervix and from inside the uterus.  Once certain that hemostasis was obtained the procedure was terminated.  The speculum was removed from the vagina after ensuring no instruments remained.  The patient tolerated the procedure well.  Sponge, lap, needle, and instrument counts were correct x 2.  VTE prophylaxis: SCDs. Antibiotic prophylaxis: none indicated and none given. She was awakened in the operating room and was taken to the PACU in stable condition.   Prentice Docker, MD 01/29/2021 12:47 PM

## 2021-01-30 ENCOUNTER — Encounter: Payer: Self-pay | Admitting: Obstetrics and Gynecology

## 2021-01-30 LAB — SURGICAL PATHOLOGY

## 2021-01-30 NOTE — Anesthesia Postprocedure Evaluation (Signed)
Anesthesia Post Note  Patient: Nicole Escobar  Procedure(s) Performed: DILATATION & CURETTAGE/HYSTEROSCOPY WITH [place]  Patient location during evaluation: PACU Anesthesia Type: General Level of consciousness: awake and alert and oriented Pain management: pain level controlled Vital Signs Assessment: post-procedure vital signs reviewed and stable Respiratory status: spontaneous breathing, nonlabored ventilation and respiratory function stable Cardiovascular status: blood pressure returned to baseline and stable Postop Assessment: no signs of nausea or vomiting Anesthetic complications: no   No notable events documented.   Last Vitals:  Vitals:   01/29/21 1345 01/29/21 1400  BP: 109/64 103/68  Pulse: (!) 56 (!) 52  Resp: 13 16  Temp: 36.6 C (!) 36.1 C  SpO2: 98% 100%    Last Pain:  Vitals:   01/29/21 1400  TempSrc: Temporal  PainSc: 4                  Donne Robillard

## 2021-02-12 ENCOUNTER — Encounter: Payer: Self-pay | Admitting: Obstetrics and Gynecology

## 2021-02-12 ENCOUNTER — Other Ambulatory Visit: Payer: Self-pay

## 2021-02-12 ENCOUNTER — Ambulatory Visit (INDEPENDENT_AMBULATORY_CARE_PROVIDER_SITE_OTHER): Payer: BC Managed Care – PPO | Admitting: Obstetrics and Gynecology

## 2021-02-12 VITALS — BP 140/90 | Ht 64.0 in | Wt 214.0 lb

## 2021-02-12 DIAGNOSIS — Z09 Encounter for follow-up examination after completed treatment for conditions other than malignant neoplasm: Secondary | ICD-10-CM

## 2021-02-12 NOTE — Progress Notes (Signed)
   Postoperative Follow-up Patient presents post op from Hysteroscopy, dilation and curettage,  polypectomy 2 weeks ago for cervical/lower uterine segment mass.  Subjective: Patient reports marked improvement in her preop symptoms. Eating a regular diet without difficulty. The patient is not having any pain.  Activity: normal activities of daily living.  She denies fever, chills, nausea, and vomiting.   A. ENDOMETRIUM; POLYPECTOMY AND CURETTAGE:  - FRAGMENTS OF BENIGN POLYPOID ADENOMYOMA.  - BACKGROUND DISORDERED PROLIFERATIVE ENDOMETRIUM WITH GLANDULAR AND  STROMAL BREAKDOWN.  - NEGATIVE FOR ATYPICAL HYPERPLASIA/EIN AND MALIGNANCY.  Objective: Vital Signs: BP 140/90   Ht 5\' 4"  (1.626 m)   Wt 214 lb (97.1 kg)   LMP 01/26/2021 (Exact Date)   BMI 36.73 kg/m  Physical Exam Constitutional:      General: She is not in acute distress.    Appearance: Normal appearance.  HENT:     Head: Normocephalic and atraumatic.  Eyes:     General: No scleral icterus.    Conjunctiva/sclera: Conjunctivae normal.  Neurological:     General: No focal deficit present.     Mental Status: She is alert and oriented to person, place, and time.     Cranial Nerves: No cranial nerve deficit.  Psychiatric:        Mood and Affect: Mood normal.        Behavior: Behavior normal.        Judgment: Judgment normal.     Assessment: 45 y.o. s/p above surgery progressing well  Plan: Patient has done well after surgery with no apparent complications.  I have discussed the post-operative course to date, and the expected progress moving forward.  The patient understands what complications to be concerned about.  I will see the patient in routine follow up, or sooner if needed.    Activity plan: no restriction  Discussed extreme importance of follow up with colposcopy based on pap smear.   Return in about 2 weeks (around 02/26/2021) for colposcopy.   Prentice Docker, MD  02/12/2021, 4:41 PM

## 2021-03-11 ENCOUNTER — Telehealth (INDEPENDENT_AMBULATORY_CARE_PROVIDER_SITE_OTHER): Payer: BC Managed Care – PPO | Admitting: Adult Health

## 2021-03-11 ENCOUNTER — Encounter: Payer: Self-pay | Admitting: Adult Health

## 2021-03-11 VITALS — Ht 64.02 in | Wt 200.0 lb

## 2021-03-11 DIAGNOSIS — R062 Wheezing: Secondary | ICD-10-CM | POA: Insufficient documentation

## 2021-03-11 DIAGNOSIS — R051 Acute cough: Secondary | ICD-10-CM | POA: Diagnosis not present

## 2021-03-11 DIAGNOSIS — J069 Acute upper respiratory infection, unspecified: Secondary | ICD-10-CM

## 2021-03-11 MED ORDER — PREDNISONE 10 MG (21) PO TBPK: ORAL_TABLET | ORAL | 0 refills | Status: DC

## 2021-03-11 MED ORDER — BENZONATATE 100 MG PO CAPS
100.0000 mg | ORAL_CAPSULE | Freq: Two times a day (BID) | ORAL | 0 refills | Status: DC | PRN
Start: 2021-03-11 — End: 2021-04-25

## 2021-03-11 MED ORDER — ALBUTEROL SULFATE HFA 108 (90 BASE) MCG/ACT IN AERS
1.0000 | INHALATION_SPRAY | Freq: Four times a day (QID) | RESPIRATORY_TRACT | 2 refills | Status: DC | PRN
Start: 2021-03-11 — End: 2021-04-25

## 2021-03-11 NOTE — Patient Instructions (Signed)
Testing for flu, covid and RSV as well as strep in our office parking lot, drive around back and call the office number when you arrive. Please come before 12 pm today.   Advised in person evaluation at anytime is advised if any symptoms do not improve, worsen or change at any given time.  Red Flags discussed. The patient was given clear instructions to go to ER or return to medical center if any red flags develop, symptoms do not improve, worsen or new problems develop. They verbalized understanding.   Albuterol Metered Dose Inhaler (MDI) What is this medication? ALBUTEROL (al [place]) treats lung diseases, such as asthma, where the airways in the lungs narrow, causing breathing problems or wheezing (bronchospasm). It is also used to treat asthma or prevent breathing problems during exercise. This medication works by opening the airways of the lungs, making it easier to breathe. It is often called a rescue- or quick-relief inhaler. This medicine may be used for other purposes; ask your health care provider or pharmacist if you have questions. COMMON BRAND NAME(S): Proair HFA, Proventil, Proventil HFA, Respirol, Ventolin, Ventolin HFA What should I tell my care team before I take this medication? They need to know if you have any of these conditions: Diabetes (high blood sugar) Heart disease High blood pressure Irregular heartbeat or rhythm Pheochromocytoma Seizures Thyroid disease An unusual or allergic reaction to albuterol, other medications, foods, dyes, or preservatives Pregnant or trying to get pregnant Breast-feeding How should I use this medication? This medication is inhaled through the mouth. Take it as directed on the prescription label. Do not use it more often than directed. This medication comes with INSTRUCTIONS FOR USE. Ask your pharmacist for directions on how to use this medication. Read the information carefully. Talk to your pharmacist or care team if you have  questions. Talk to your care team about the use of this medication in children. While it may be given to children for selected conditions, precautions do apply. Overdosage: If you think you have taken too much of this medicine contact a poison control center or emergency room at once. NOTE: This medicine is only for you. Do not share this medicine with others. What if I miss a dose? If you take this medication on a regular basis, take it as soon as you can. If it is almost time for your next dose, take only that dose. Do not take double or extra doses. What may interact with this medication? Caffeine Chloroquine Cisapride Diuretics Medications for colds Medications for depression or for emotional or psychotic conditions Medications for weight loss including some herbal products Methadone Pentamidine Some antibiotics like clarithromycin, erythromycin, levofloxacin, and linezolid Some heart medications Steroid hormones like dexamethasone, cortisone, hydrocortisone Theophylline Thyroid hormones This list may not describe all possible interactions. Give your health care provider a list of all the medicines, herbs, non-prescription drugs, or dietary supplements you use. Also tell them if you smoke, drink alcohol, or use illegal drugs. Some items may interact with your medicine. What should I watch for while using this medication? Visit your care team for regular checks on your progress. Tell your care team if your symptoms do not start to get better or if they get worse. If your symptoms get worse or if you are using this medication more than normal, call your care team right away. Do not treat yourself for coughs, colds or allergies without asking your care team for advice. Some nonprescription medications can affect this one.  You and your care team should develop an Asthma Action Plan that is just for you. Be sure to know what to do if you are in the yellow (asthma is getting worse) or red  (medical alert) zones. Your mouth may get dry. Chewing sugarless gum or sucking hard candy and drinking plenty of water may help. Contact your health care provider if the problem does not go away or is severe. What side effects may I notice from receiving this medication? Side effects that you should report to your care team as soon as possible: Allergic reactions--skin rash, itching, hives, swelling of the face, lips, tongue, or throat Heart rhythm changes--fast or irregular heartbeat, dizziness, feeling faint or lightheaded, chest pain, trouble breathing Increase in blood pressure Muscle pain or cramps Wheezing or trouble breathing that is worse after use Side effects that usually do not require medical attention (report to your care team if they continue or are bothersome): Change in taste Dry mouth Headache Sore throat Tremors or shaking Trouble sleeping This list may not describe all possible side effects. Call your doctor for medical advice about side effects. You may report side effects to FDA at 1-800-FDA-1088. Where should I keep my medication? Keep out of the reach of children and pets. Store at room temperature between 20 and 25 degrees C (68 and 77 degrees F). Keep inhaler away from extreme heat and cold. Get rid of it when the dose counter reads 0 or after the expiration date, whichever is first. To get rid of medications that are no longer needed or have expired: Take the medication to a medication take-back program. Check with your pharmacy or law enforcement to find a location. If you cannot return the medication, ask your care team how to get rid of this medication safely. NOTE: This sheet is a summary. It may not cover all possible information. If you have questions about this medicine, talk to your doctor, pharmacist, or health care provider.  2022 Elsevier/Gold Standard (2020-02-24 00:00:00) Benzonatate Capsules What is this medication? BENZONATATE (ben ZOE na tate) is  used to relieve cough. It works by calming your cough reflex. It belongs to a group of medications called cough suppressants. This medicine may be used for other purposes; ask your health care provider or pharmacist if you have questions. COMMON BRAND NAME(S): Tessalon Perles, Zonatuss What should I tell my care team before I take this medication? They need to know if you have any of these conditions: Kidney or liver disease An unusual or allergic reaction to benzonatate, anesthetics, other medications, foods, dyes, or preservatives Pregnant or trying to get pregnant Breast-feeding How should I use this medication? Take this medication by mouth with a glass of water. Follow the directions on the prescription label. Avoid breaking, chewing, or sucking the capsule, as this can cause serious side effects. Take your medication at regular intervals. Do not take your medication more often than directed. Talk to your care team about the use of this medication in children. While this medication may be prescribed for children as young as 1 years old for selected conditions, precautions do apply. Overdosage: If you think you have taken too much of this medicine contact a poison control center or emergency room at once. NOTE: This medicine is only for you. Do not share this medicine with others. What if I miss a dose? If you miss a dose, take it as soon as you can. If it is almost time for your next dose, take only  that dose. Do not take double or extra doses. What may interact with this medication? Do not take this medication with any of the following: MAOIs like Carbex, Eldepryl, Marplan, Nardil, and Parnate This list may not describe all possible interactions. Give your health care provider a list of all the medicines, herbs, non-prescription drugs, or dietary supplements you use. Also tell them if you smoke, drink alcohol, or use illegal drugs. Some items may interact with your medicine. What should I  watch for while using this medication? Tell your care team if your symptoms do not improve or if they get worse. If you have a high fever, skin rash, or headache, see your care team. You may get drowsy or dizzy. Do not drive, use machinery, or do anything that needs mental alertness until you know how this medication affects you. Do not sit or stand up quickly, especially if you are an older patient. This reduces the risk of dizzy or fainting spells. What side effects may I notice from receiving this medication? Side effects that you should report to your care team as soon as possible: Allergic reactions--skin rash, itching, hives, swelling of the face, lips, tongue, or throat Confusion Hallucinations Side effects that usually do not require medical attention (report to your care team if they continue or are bothersome): Burning or tingling of the tongue, mouth, throat, or face Dizziness Drowsiness This list may not describe all possible side effects. Call your doctor for medical advice about side effects. You may report side effects to FDA at 1-800-FDA-1088. Where should I keep my medication? Keep out of the reach of children. Store at room temperature between 15 and 30 degrees C (59 and 86 degrees F). Keep tightly closed. Protect from light and moisture. Throw away any unused medication after the expiration date. NOTE: This sheet is a summary. It may not cover all possible information. If you have questions about this medicine, talk to your doctor, pharmacist, or health care provider.  2022 Elsevier/Gold Standard (2020-06-20 00:00:00) Prednisolone Tablets What is this medication? PREDNISOLONE (pred NISS oh lone) treats many conditions such as asthma, allergic reactions, arthritis, inflammatory bowel diseases, adrenal, and blood or bone marrow disorders. It works by decreasing inflammation, slowing down an overactive immune system, or replacing cortisol normally made in the body. Cortisol is a  hormone that plays an important role in how the body responds to stress, illness, and injury. It belongs to a group of medications called steroids. This medicine may be used for other purposes; ask your health care provider or pharmacist if you have questions. COMMON BRAND NAME(S): Millipred, Millipred DP, Millipred DP 12-Day, Millipred DP 6 Day, Prednoral What should I tell my care team before I take this medication? They need to know if you have any of these conditions: Cushing's syndrome Diabetes Glaucoma Heart problems or disease High blood pressure Infection such as herpes, measles, tuberculosis, or chickenpox Kidney disease Liver disease Mental problems Myasthenia gravis Osteoporosis Seizures Stomach ulcer or intestine disease including colitis and diverticulitis Thyroid problem An unusual or allergic reaction to lactose, prednisolone, other medications, foods, dyes, or preservatives Pregnant or trying to get pregnant Breast-feeding How should I use this medication? Take this medication by mouth with a glass of water. Follow the directions on the prescription label. Take it with food or milk to avoid stomach upset. If you are taking this medication once a day, take it in the morning. Do not take more medication than you are told to take. Do not  suddenly stop taking your medication because you may develop a severe reaction. Your care team will tell you how much medication to take. If your care team wants you to stop the medication, the dose may be slowly lowered over time to avoid any side effects. Talk to your care team about the use of this medication in children. Special care may be needed. Overdosage: If you think you have taken too much of this medicine contact a poison control center or emergency room at once. NOTE: This medicine is only for you. Do not share this medicine with others. What if I miss a dose? If you miss a dose, take it as soon as you can. If it is almost time  for your next dose, take only that dose. Do not take double or extra doses. What may interact with this medication? Do not take this medication with any of the following: Metyrapone Mifepristone This medication may also interact with the following: Aminoglutethimide Amphotericin B Aspirin and aspirin-like medications Barbiturates Certain medications for diabetes, like glipizide or glyburide Cholestyramine Cholinesterase inhibitors Cyclosporine Digoxin Diuretics Ephedrine Female hormones, like estrogens and birth control pills Isoniazid Ketoconazole NSAIDS, medications for pain and inflammation, like ibuprofen or naproxen Phenytoin Rifampin Toxoids Vaccines Warfarin This list may not describe all possible interactions. Give your health care provider a list of all the medicines, herbs, non-prescription drugs, or dietary supplements you use. Also tell them if you smoke, drink alcohol, or use illegal drugs. Some items may interact with your medicine. What should I watch for while using this medication? Visit your care team for regular checks on your progress. If you are taking this medication over a prolonged period, carry an identification card with your name and address, the type and dose of your medication, and your care team's name and address. This medication may increase your risk of getting an infection. Tell your care team if you are around anyone with measles or chickenpox, or if you develop sores or blisters that do not heal properly. If you are going to have surgery, tell your care team that you have taken this medication within the last twelve months. Ask your care team about your diet. You may need to lower the amount of salt you eat. This medication may increase blood sugar. Ask your care team if changes in diet or medications are needed if you have diabetes. What side effects may I notice from receiving this medication? Side effects that you should report to your care  team as soon as possible: Allergic reactions--skin rash, itching, hives, swelling of the face, lips, tongue, or throat Cushing syndrome--increased fat around the midsection, upper back, neck, or face, pink or purple stretch marks on the skin, thinning, fragile skin that easily bruises, unexpected hair growth High blood sugar (hyperglycemia)--increased thirst or amount of urine, unusual weakness or fatigue, blurry vision Increase in blood pressure Infection--fever, chills, cough, sore throat, wounds that don't heal, pain or trouble when passing urine, general feeling of discomfort or being unwell Low adrenal gland function--nausea, vomiting, loss of appetite, unusual weakness or fatigue, dizziness Mood and behavior changes--anxiety, nervousness, confusion, hallucinations, irritability, hostility, thoughts of suicide or self-harm, worsening mood, feelings of depression Stomach bleeding--bloody or black, tar-like stools, vomiting blood or brown material that looks like coffee grounds Swelling of the ankles, hands, or feet Side effects that usually do not require medical attention (report to your care team if they continue or are bothersome): Acne General discomfort and fatigue Headache Increase in appetite Nausea  Trouble sleeping Weight gain This list may not describe all possible side effects. Call your doctor for medical advice about side effects. You may report side effects to FDA at 1-800-FDA-1088. Where should I keep my medication? Keep out of the reach of children. Store at room temperature between 15 and 30 degrees C (59 and 86 degrees F). Keep container tightly closed. Throw away any unused medication after the expiration date. NOTE: This sheet is a summary. It may not cover all possible information. If you have questions about this medicine, talk to your doctor, pharmacist, or health care provider.  2022 Elsevier/Gold Standard (2020-06-08 00:00:00) Bronchospasm, Adult Bronchospasm is  when the small airways in the lungs narrow. This can make it very hard to breathe. Swelling and more mucus than normal can add to this problem. What are the causes? Having a cold. Exercise. The smell from sprays, perfumes, candles, and cleaners. Cold air. Stress or strong feelings such as laughing or crying. What increases the risk? Having asthma. Smoking. Being around someone who smokes (secondhand smoke). Having allergies. Being allergic to certain foods, medicine, or bug bites or stings. What are the signs or symptoms? Making a high-pitched whistling sound when you breathe, most often when you breathe out (wheezing). Coughing. A tight feeling in your chest. Feeling like you cannot catch your breath. Feeling like you have no energy to exercise. Breathing that is noisy. A cough that has a high pitch. How is this treated?  Using medicines that you breathe in (inhale). These open up the airways and help you breathe. Medicines can be taken with a metered dose inhaler or a nebulizer device. Taking medicines to reduce swelling. Getting rid of what started the bronchospasm. Follow these instructions at home: Medicines Take over-the-counter and prescription medicines only as told by your doctor. If you need to use an inhaler or nebulizer to take your medicine, ask your doctor how to use it. You may be given a spacer to use with your inhaler. This makes it easier to get the medicine from the inhaler into your lungs. Lifestyle Do not smoke or use any products that contain nicotine or tobacco. If you need help quitting, ask your doctor. Keep track of things that start your bronchospasm. Avoid these if you can. When pollen, air pollution, or humidity is bad, keep windows closed. Use an air conditioner if you have one. Find ways to cope with stress and your feelings. Activity Some people have bronchospasm when they exercise. This is called exercise-induced bronchoconstriction (EIB). If you  have this problem, talk with your doctor about how to deal with EIB. Some tips include: Use your inhaler before exercise. Exercise indoors if it is very cold or humid, or if the pollen and mold counts are high. Warm up and cool down before and after exercise. Stop your exercise right away if your symptoms start or get worse. General instructions If you have asthma, make sure you have an asthma action plan. Stay up to date on your shots (immunizations). Keep all follow-up visits. Get help right away if: You have trouble breathing. You wheeze and cough and this does not get better after you take medicine. You have chest pain. You have trouble speaking more than one word in a sentence. These symptoms may be an emergency. Get help right away. Call 911. Do not wait to see if the symptoms will go away. Do not drive yourself to the hospital. Summary Bronchospasm is when the small airways in the lungs narrow. Swelling  and more mucus than normal can add to this problem. This can make it very hard to breathe. Do not smoke or use any products that contain nicotine or tobacco. If you need help quitting, ask your doctor. Get help right away if you wheeze and cough and this does not get better after you take medicine. This information is not intended to replace advice given to you by your health care provider. Make sure you discuss any questions you have with your health care provider. Document Revised: 10/01/2020 Document Reviewed: 10/01/2020 Elsevier Patient Education  [place]. Cough, Adult A cough helps to clear your throat and lungs. A cough may be a sign of an illness or another medical condition. An acute cough may only last 2-3 weeks, while a chronic cough may last 8 or more weeks. Many things can cause a cough. They include: Germs (viruses or bacteria) that attack the airway. Breathing in things that bother (irritate) your lungs. Allergies. Asthma. Mucus that runs down the back  of your throat (postnasal drip). Smoking. Acid backing up from the stomach into the tube that moves food from the mouth to the stomach (gastroesophageal reflux). Some medicines. Lung problems. Other medical conditions, such as heart failure or a blood clot in the lung (pulmonary embolism). Follow these instructions at home: Medicines Take over-the-counter and prescription medicines only as told by your doctor. Talk with your doctor before you take medicines that stop a cough (cough suppressants). Lifestyle  Do not smoke, and try not to be around smoke. Do not use any products that contain nicotine or tobacco, such as cigarettes, e-cigarettes, and chewing tobacco. If you need help quitting, ask your doctor. Drink enough fluid to keep your pee (urine) pale yellow. Avoid caffeine. Do not drink alcohol if your doctor tells you not to drink. General instructions  Watch for any changes in your cough. Tell your doctor about them. Always cover your mouth when you cough. Stay away from things that make you cough, such as perfume, candles, campfire smoke, or cleaning products. If the air is dry, use a cool mist vaporizer or humidifier in your home. If your cough is worse at night, try using extra pillows to raise your head up higher while you sleep. Rest as needed. Keep all follow-up visits as told by your doctor. This is important. Contact a doctor if: You have new symptoms. You cough up pus. Your cough does not get better after 2-3 weeks, or your cough gets worse. Cough medicine does not help your cough and you are not sleeping well. You have pain that gets worse or pain that is not helped with medicine. You have a fever. You are losing weight and you do not know why. You have night sweats. Get help right away if: You cough up blood. You have trouble breathing. Your heartbeat is very fast. These symptoms may be an emergency. Do not wait to see if the symptoms will go away. Get medical  help right away. Call your local emergency services (911 in the [place] Do not drive yourself to the hospital. Summary A cough helps to clear your throat and lungs. Many things can cause a cough. Take over-the-counter and prescription medicines only as told by your doctor. Always cover your mouth when you cough. Contact a doctor if you have new symptoms or you have a cough that does not get better or gets worse. This information is not intended to replace advice given to you by your health care provider.  Make sure you discuss any questions you have with your health care provider. Document Revised: 04/29/2019 Document Reviewed: 03/29/2018 Elsevier Patient Education  [place]. Upper Respiratory Infection, Adult An upper respiratory infection (URI) affects the nose, throat, and upper airways that lead to the lungs. The most common type of URI is often called the common cold. URIs usually get better on their own, without medical treatment. What are the causes? A URI is caused by a germ (virus). You may catch these germs by: Breathing in droplets from an infected person's cough or sneeze. Touching something that has the germ on it (is contaminated) and then touching your mouth, nose, or eyes. What increases the risk? You are more likely to get a URI if: You are very young or very old. You have close contact with others, such as at work, school, or a health care facility. You smoke. You have long-term (chronic) heart or lung disease. You have a weakened disease-fighting system (immune system). You have nasal allergies or asthma. You have a lot of stress. You have poor nutrition. What are the signs or symptoms? Runny or stuffy (congested) nose. Cough. Sneezing. Sore throat. Headache. Feeling tired (fatigue). Fever. Not wanting to eat as much as usual. Pain in your forehead, behind your eyes, and over your cheekbones (sinus pain). Muscle aches. Redness or irritation of the  eyes. Pressure in the ears or face. How is this treated? URIs usually get better on their own within 7-10 days. Medicines cannot cure URIs, but your doctor may recommend certain medicines to help relieve symptoms, such as: Over-the-counter cold medicines. Medicines to reduce coughing (cough suppressants). Coughing is a type of defense against infection that helps to clear the nose, throat, windpipe, and lungs (respiratory system). Take these medicines only as told by your doctor. Medicines to lower your fever. Follow these instructions at home: Activity Rest as needed. If you have a fever, stay home from work or school until your fever is gone, or until your doctor says you may return to work or school. You should stay home until you cannot spread the infection anymore (you are not contagious). Your doctor may have you wear a face mask so you have less risk of spreading the infection. Relieving symptoms Rinse your mouth often with salt water. To make salt water, dissolve -1 tsp (3-6 g) of salt in 1 cup (237 mL) of warm water. Use a cool-mist humidifier to add moisture to the air. This can help you breathe more easily. Eating and drinking  Drink enough fluid to keep your pee (urine) pale yellow. Eat soups and other clear broths. General instructions  Take over-the-counter and prescription medicines only as told by your doctor. Do not smoke or use any products that contain nicotine or tobacco. If you need help quitting, ask your doctor. Avoid being where people are smoking (avoid secondhand smoke). Stay up to date on all your shots (immunizations), and get the flu shot every year. Keep all follow-up visits. How to prevent the spread of infection to others  Wash your hands with soap and water for at least 20 seconds. If you cannot use soap and water, use hand sanitizer. Avoid touching your mouth, face, eyes, or nose. Cough or sneeze into a tissue or your sleeve or elbow. Do not cough or  sneeze into your hand or into the air. Contact a doctor if: You are getting worse, not better. You have any of these: A fever or chills. Brown or red mucus  in your nose. Yellow or brown fluid (discharge)coming from your nose. Pain in your face, especially when you bend forward. Swollen neck glands. Pain when you swallow. White areas in the back of your throat. Get help right away if: You have shortness of breath that gets worse. You have very bad or constant: Headache. Ear pain. Pain in your forehead, behind your eyes, and over your cheekbones (sinus pain). Chest pain. You have long-lasting (chronic) lung disease along with any of these: Making high-pitched whistling sounds when you breathe, most often when you breathe out (wheezing). Long-lasting cough (more than 14 days). Coughing up blood. A change in your usual mucus. You have a stiff neck. You have changes in your: Vision. Hearing. Thinking. Mood. These symptoms may be an emergency. Get help right away. Call 911. Do not wait to see if the symptoms will go away. Do not drive yourself to the hospital. Summary An upper respiratory infection (URI) is caused by a germ (virus). The most common type of URI is often called the common cold. URIs usually get better within 7-10 days. Take over-the-counter and prescription medicines only as told by your doctor. This information is not intended to replace advice given to you by your health care provider. Make sure you discuss any questions you have with your health care provider. Document Revised: 10/10/2020 Document Reviewed: 10/10/2020 Elsevier Patient Education  [place].

## 2021-03-11 NOTE — Progress Notes (Signed)
Virtual Visit via Video Note  I connected with Nicole Escobar on 03/11/21 at 11:00 AM EST by a video enabled telemedicine application and verified that I am speaking with the correct person using two identifiers.  Location: Patient: at  home  Provider: Provider: Provider's office at  [facility], [place].      I discussed the limitations of evaluation and management by telemedicine and the availability of in person appointments. The patient expressed understanding and agreed to proceed.  History of Present Illness: Patient started Saturday 03/09/21 sore throat, congestion. Temperature low grade- has not taken. Cough. Bronchial type cough. Mild wheezing. Body aches.  Son and husband are both sick.   Son tested negative for flu, covid and strep.   Patient  denies any chills, rash, chest pain,nausea, vomiting, or diarrhea.     Observations/Objective: No vital signs available.   Patient is alert and oriented and responsive to questions Engages in conversation with provider. Speaks in full sentences without any pauses without any shortness of breath or distress.     Denies dizziness, lightheadedness, pre syncopal or syncopal episodes.   Assessment and Plan:  1. Viral upper respiratory tract infection Testing today RSV, flu, covid, strep in parking lot.   2. Wheezing Discussed red flags and prednisone and albuterol sent in. Discussed side effects.   3. Acute cough Benzonatate for cough. Discussed directions and use/ side effects.    Meds ordered this encounter  Medications   predniSONE (STERAPRED UNI-PAK 21 TAB) 10 MG (21) TBPK tablet    Sig: PO: Take 6 tablets on day 1:Take 5 tablets day 2:Take 4 tablets day 3: Take 3 tablets day 4:Take 2 tablets day five: 5 Take 1 tablet day 6    Dispense:  21 tablet    Refill:  0   benzonatate (TESSALON) 100 MG capsule    Sig: Take 1 capsule (100 mg total) by mouth 2 (two) times daily as needed for cough.     Dispense:  20 capsule    Refill:  0   albuterol (VENTOLIN HFA) 108 (90 Base) MCG/ACT inhaler    Sig: Inhale 1-2 puffs into the lungs every 6 (six) hours as needed for wheezing or shortness of breath (only as needed for cough/ wheezing).    Dispense:  8 g    Refill:  2    Testing for flu, covid, rsv and strep today in parking lot.  Discussed likely viral etiology need testing.  Sent benzonatate, albuterol and steroid dose pack for cough, wheezing.    Follow Up Instructions:   Advised in person evaluation at anytime is advised if any symptoms do not improve, worsen or change at any given time.  Red Flags discussed. The patient was given clear instructions to go to ER or return to medical center if any red flags develop, symptoms do not improve, worsen or new problems develop. They verbalized understanding.  I discussed the assessment and treatment plan with the patient. The patient was provided an opportunity to ask questions and all were answered. The patient agreed with the plan and demonstrated an understanding of the instructions.   The patient was advised to call back or seek an in-person evaluation if the symptoms worsen or if the condition fails to improve as anticipated.  Return if symptoms worsen or fail to improve, for at any time for any worsening symptoms, Go to Emergency room/ urgent care if worse.   Marcille Buffy, FNP

## 2021-03-12 LAB — COVID-19, FLU A+B AND RSV
Influenza A, NAA: DETECTED — AB
Influenza B, NAA: NOT DETECTED
RSV, NAA: NOT DETECTED
SARS-CoV-2, NAA: NOT DETECTED

## 2021-03-12 NOTE — Progress Notes (Signed)
Influenza A is positive, she is past the point of giving Tamiflu, symptomatic treatment and returning to office at anytime if any symptoms persist or worsening at anytime.  Covid and rsv influenza B is negative.

## 2021-03-13 ENCOUNTER — Telehealth: Payer: Self-pay

## 2021-03-13 LAB — CULTURE, GROUP A STREP
MICRO NUMBER:: 12774144
SPECIMEN QUALITY:: ADEQUATE

## 2021-03-13 NOTE — Progress Notes (Signed)
Negative strep

## 2021-03-13 NOTE — Telephone Encounter (Signed)
-----   Message from Doreen Beam, [place] sent at 03/12/2021  4:59 PM EST ----- Influenza A is positive, she is past the point of giving Tamiflu, symptomatic treatment and returning to office at anytime if any symptoms persist or worsening at anytime.  Covid and rsv influenza B is negative.

## 2021-03-13 NOTE — Telephone Encounter (Signed)
Patient returned office phone call. 

## 2021-04-02 ENCOUNTER — Ambulatory Visit: Payer: BC Managed Care – PPO | Admitting: Obstetrics and Gynecology

## 2021-04-24 ENCOUNTER — Telehealth: Payer: Self-pay

## 2021-04-24 NOTE — Telephone Encounter (Signed)
Pt calling; is having the same pain she was having before her surgery; missed last appt; probably needs to reschedule but was tx'd to nurse line.  [phone]

## 2021-04-25 ENCOUNTER — Other Ambulatory Visit (HOSPITAL_COMMUNITY)
Admission: RE | Admit: 2021-04-25 | Discharge: 2021-04-25 | Disposition: A | Discharge status: Home / Self Care | Payer: BC Managed Care – PPO | Source: Ambulatory Visit | Attending: Obstetrics & Gynecology | Admitting: Obstetrics & Gynecology

## 2021-04-25 ENCOUNTER — Encounter: Payer: Self-pay | Admitting: Obstetrics & Gynecology

## 2021-04-25 ENCOUNTER — Other Ambulatory Visit: Payer: Self-pay

## 2021-04-25 ENCOUNTER — Ambulatory Visit (INDEPENDENT_AMBULATORY_CARE_PROVIDER_SITE_OTHER): Payer: BC Managed Care – PPO | Admitting: Obstetrics & Gynecology

## 2021-04-25 VITALS — BP 120/80 | Ht 64.0 in | Wt 223.0 lb

## 2021-04-25 DIAGNOSIS — Z87410 Personal history of cervical dysplasia: Secondary | ICD-10-CM | POA: Diagnosis not present

## 2021-04-25 DIAGNOSIS — Z8742 Personal history of other diseases of the female genital tract: Secondary | ICD-10-CM | POA: Insufficient documentation

## 2021-04-25 DIAGNOSIS — N939 Abnormal uterine and vaginal bleeding, unspecified: Secondary | ICD-10-CM

## 2021-04-25 DIAGNOSIS — Z9889 Other specified postprocedural states: Secondary | ICD-10-CM | POA: Diagnosis not present

## 2021-04-25 MED ORDER — MEDROXYPROGESTERONE ACETATE 10 MG PO TABS
10.0000 mg | ORAL_TABLET | Freq: Every day | ORAL | 3 refills | Status: DC
Start: 2021-04-25 — End: 2021-08-13

## 2021-04-25 NOTE — Progress Notes (Signed)
°  HPI:      [name] is a 46 y.o. [id number] who LMP was Patient's last menstrual period was 04/08/2021., presents today for a problem visit.  She complains of  irreg periods since Nov (procedure then for cervical polypectomy 4 cm), but also pain and pressure in her lower pelvis.  No associated sx's.  No modifiers or other context.  PMHx: She  has a past medical history of Emphysema of lung ([place]) and Frequent headaches. Also,  has a past surgical history that includes Tubal ligation (2013); Tonsilectomy/adenoidectomy with myringotomy (Bilateral); Dilation and curettage of uterus; Tonsillectomy; and Dilatation & curettage/hysteroscopy with myosure (N/A, 01/29/2021)., family history includes Alcohol abuse in her mother; Arthritis in her maternal grandmother and mother; Cancer in her maternal grandmother and mother; Diabetes in her mother; Hyperlipidemia in her mother; Hypertension in her maternal grandmother and mother.,  reports that she quit smoking about 18 years ago. Her smoking use included cigarettes. She has never used smokeless tobacco. She reports that she does not currently use drugs after having used the following drugs: Marijuana. Frequency: 0.40 times per week. She reports that she does not drink alcohol.  She  Current Outpatient Medications:    medroxyPROGESTERone (PROVERA) 10 MG tablet, Take 1 tablet (10 mg total) by mouth daily for 10 days. Take for 10 days if heavy or prolonged period occurs., Disp: 10 tablet, Rfl: 3  Also, is allergic to sulfa antibiotics.  ROS  Objective: BP 120/80    Ht 5\' 4"  (1.626 m)    Wt 223 lb (101.2 kg)    LMP 04/08/2021    BMI 38.28 kg/m  OBGyn Exam  ASSESSMENT/PLAN:  dysfunctional uterine bleeding, irregular menses, and pelvic pain, history of cervical polyp (large)  Problem List Items Addressed This Visit       Other   History of cervical polypectomy   History of cervical dysplasia   Relevant Orders   Cytology - PAP   Other Visit Diagnoses      Abnormal uterine bleeding    -  Primary   Relevant Orders   US PELVIC COMPLETE WITH TRANSVAGINAL     Pt expresses desire for hysterectomy.  Will check [place] for masses, although exam normal today (no cervical mass or polyp, and uterus not enlarged).  Treatment options for irreg periods discussed (hormones, ablation, hysterectomy).  Patient has abnormal uterine bleeding . She has a normal exam today, with no evidence of lesions.  Evaluation includes the following: exam, labs such as hormonal testing, and pelvic ultrasound to evaluate for any structural gynecologic abnormalities.  Patient to follow up after testing.  Treatment option for menorrhagia or menometrorrhagia discussed in great detail with the patient.  Options include hormonal therapy, IUD therapy such as Mirena, D&C, Ablation, and Hysterectomy.  The pros and cons of each option discussed with patient.  PAP as her PAP was abnormal at time of polyp.  DIscussed Col[place] or other procedures based on results.  Barnett Applebaum, MD, [facility], [facility] 04/25/2021  8:46 AM

## 2021-05-01 LAB — CYTOLOGY - PAP
Diagnosis: NEGATIVE
Diagnosis: REACTIVE

## 2021-05-08 DIAGNOSIS — N921 Excessive and frequent menstruation with irregular cycle: Secondary | ICD-10-CM | POA: Diagnosis not present

## 2021-05-08 DIAGNOSIS — R87618 Other abnormal cytological findings on specimens from cervix uteri: Secondary | ICD-10-CM | POA: Diagnosis not present

## 2021-05-09 ENCOUNTER — Other Ambulatory Visit: Payer: Self-pay

## 2021-05-09 ENCOUNTER — Ambulatory Visit (INDEPENDENT_AMBULATORY_CARE_PROVIDER_SITE_OTHER): Payer: BC Managed Care – PPO

## 2021-05-09 DIAGNOSIS — N939 Abnormal uterine and vaginal bleeding, unspecified: Secondary | ICD-10-CM | POA: Diagnosis not present

## 2021-06-18 ENCOUNTER — Other Ambulatory Visit: Payer: Self-pay

## 2021-06-18 ENCOUNTER — Encounter: Payer: Self-pay | Admitting: Family

## 2021-06-18 ENCOUNTER — Ambulatory Visit (INDEPENDENT_AMBULATORY_CARE_PROVIDER_SITE_OTHER): Payer: BC Managed Care – PPO | Admitting: Family

## 2021-06-18 VITALS — BP 128/82 | HR 78 | Temp 97.4°F | Ht 64.0 in | Wt 225.2 lb

## 2021-06-18 DIAGNOSIS — M25562 Pain in left knee: Secondary | ICD-10-CM

## 2021-06-18 DIAGNOSIS — G8929 Other chronic pain: Secondary | ICD-10-CM

## 2021-06-18 DIAGNOSIS — M25561 Pain in right knee: Secondary | ICD-10-CM

## 2021-06-18 DIAGNOSIS — R0982 Postnasal drip: Secondary | ICD-10-CM

## 2021-06-18 MED ORDER — MELOXICAM 7.5 MG PO TABS: 7.5000 mg | ORAL_TABLET | Freq: Every day | ORAL | 1 refills | Status: DC | PRN

## 2021-06-18 MED ORDER — FLUTICASONE PROPIONATE 50 MCG/ACT NA SUSP: 2.0000 | Freq: Every day | NASAL | 6 refills | Status: DC

## 2021-06-18 NOTE — Progress Notes (Signed)
? ?Subjective:  ? ? Patient ID: Nicole Escobar, female    DOB: 14-Apr-1975, 46 y.o.   MRN: [id number] ? ?CC: Nicole Escobar is a 46 y.o. female who presents today for an acute visit.   ? ?HPI: HPI ?Complains of bilateral anterior knee pain , worse on right , x 2 weeks, constant.  ?Previous knee pain 6-8 months ago, resolved and has returned. Describes area of swelling above the right knee.  ?Trouble bending knee. Feels that knee will give out.  ?No injury, fever, chills, redness, wound.  ?She is working 10 hrs days at [employer] ? ? ?[place] right leg 08/25/20 negative DVT ?Right knee x-ray 08/28/2020 shows mild medial compartment joint space narrowing bilateral knees, worse on right.  Small right knee joint effusion ? ?She has dry cough x 2 days, coughs when laying down.  ?Congestion and sinus pressure has resolved. Endorses ear pain, sore throat, fever, cp.  ? ?Quit smoking 10 years ago  ?She has been inhalers in the past.  ?She has try any medication ?No h/o allergies.  ? ?  ? ?she has a follow-up with Dr. Glennon Mac OB/[facility]N for menorrhagia, history of positive HPV. ? ?HISTORY:  ?Past Medical History:  ?Diagnosis Date  ? Emphysema of lung ([place])   ? Frequent headaches   ? unknown onset  ? ?Past Surgical History:  ?Procedure Laterality Date  ? DILATATION & CURETTAGE/HYSTEROSCOPY WITH MYOSURE N/A 01/29/2021  ? Procedure: [place];  Surgeon: Will Bonnet, MD;  Location: [facility] ORS;  Service: Gynecology;  Laterality: N/A;  ? DILATION AND CURETTAGE OF UTERUS    ? TONSILECTOMY/ADENOIDECTOMY WITH MYRINGOTOMY Bilateral   ? TONSILLECTOMY    ? TUBAL LIGATION  2013  ? ?Family History  ?Problem Relation Age of Onset  ? Alcohol abuse Mother   ? Arthritis Mother   ? Cancer Mother   ?     ovary  ? Hyperlipidemia Mother   ? Hypertension Mother   ? Diabetes Mother   ? Arthritis Maternal Grandmother   ? Hypertension Maternal Grandmother   ? Cancer Maternal Grandmother   ?     colon  ? ? ?Allergies: Sulfa  antibiotics ?Current Outpatient Medications on File Prior to Visit  ?Medication Sig Dispense Refill  ? medroxyPROGESTERone (PROVERA) 10 MG tablet Take 1 tablet (10 mg total) by mouth daily for 10 days. Take for 10 days if heavy or prolonged period occurs. 10 tablet 3  ? ?No current facility-administered medications on file prior to visit.  ? ? ?Social History  ? ?Tobacco Use  ? Smoking status: Former  ?  Types: Cigarettes  ?  Quit date: 03/25/2003  ?  Years since quitting: 18.2  ? Smokeless tobacco: Never  ?Vaping Use  ? Vaping Use: Never used  ?Substance Use Topics  ? Alcohol use: No  ?  Alcohol/week: 0.0 standard drinks  ? Drug use: Not Currently  ?  Frequency: 0.4 times per week  ?  Types: Marijuana  ?  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 ? ?Review of Systems  ?Constitutional:  Negative for chills and fever.  ?HENT:  Negative for congestion.   ?Respiratory:  Positive for cough. Negative for shortness of breath.   ?Cardiovascular:  Negative for chest pain, palpitations and leg swelling (no calf swelling).  ?Gastrointestinal:  Negative for nausea and vomiting.  ?   ?Objective:  ?  ?BP 128/82 (BP Location: Left Arm, Patient Position: Sitting, Cuff Size:  Normal)   Pulse 78   Temp (!) 97.4 ?F (36.3 ?C) (Oral)   Ht '5\' 4"'$  (1.626 m)   Wt 225 lb 3.2 oz (102.2 kg)   LMP 05/30/2021 (Exact Date)   SpO2 98%   BMI 38.66 kg/m?  ? ? ?Physical Exam ?Vitals reviewed.  ?Constitutional:   ?   Appearance: She is well-developed.  ?HENT:  ?   Head: Normocephalic and atraumatic.  ?   Right Ear: Hearing, tympanic membrane, ear canal and external ear normal. No decreased hearing noted. No drainage, swelling or tenderness. No middle ear effusion. No foreign body. Tympanic membrane is not erythematous or bulging.  ?   Left Ear: Hearing, tympanic membrane, ear canal and external ear normal. No decreased hearing noted. No drainage, swelling or tenderness.  No middle ear effusion. No foreign body. Tympanic membrane is not  erythematous or bulging.  ?   Nose: Nose normal. No rhinorrhea.  ?   Right Sinus: No maxillary sinus tenderness or frontal sinus tenderness.  ?   Left Sinus: No maxillary sinus tenderness or frontal sinus tenderness.  ?   Mouth/Throat:  ?   Pharynx: Uvula midline. No oropharyngeal exudate or posterior oropharyngeal erythema.  ?   Tonsils: No tonsillar abscesses.  ?Eyes:  ?   Conjunctiva/sclera: Conjunctivae normal.  ?Cardiovascular:  ?   Rate and Rhythm: Regular rhythm.  ?   Pulses: Normal pulses.  ?   Heart sounds: Normal heart sounds.  ?Pulmonary:  ?   Effort: Pulmonary effort is normal.  ?   Breath sounds: Normal breath sounds. No wheezing, rhonchi or rales.  ?Musculoskeletal:  ?   Right knee: Swelling present. No effusion, erythema, ecchymosis or bony tenderness. Decreased range of motion. Tenderness present over the lateral joint line.  ?   Left knee: No effusion, erythema, ecchymosis or bony tenderness. Normal range of motion. No tenderness.  ?   Comments: Bilateral knees are symmetric. Subtle supraarticular area right patella which appears edematous; area is not erythematous.  No severe pain with deep palpation of the area.  Area is nonfluctuant.  Nor increase in temperature  ? ?Crepitus felt with flexion of bilateral knees. ? ?Right knee:  ?Able to extend to -5 to 10 degrees. Decrease ROM with flexion due to pain.  fl No catching with McMurray maneuver. No patellar apprehension.  ? ?No calf tenderness of lower leg edema bilaterally.  ?  ?Lymphadenopathy:  ?   Head:  ?   Right side of head: No submental, submandibular, tonsillar, preauricular, posterior auricular or occipital adenopathy.  ?   Left side of head: No submental, submandibular, tonsillar, preauricular, posterior auricular or occipital adenopathy.  ?   Cervical: No cervical adenopathy.  ?Skin: ?   General: Skin is warm and dry.  ?Neurological:  ?   Mental Status: She is alert.  ?Psychiatric:     ?   Speech: Speech normal.     ?   Behavior: Behavior  normal.     ?   Thought Content: Thought content normal.  ? ? ?   ?Assessment & Plan:  ? ?Problem List Items Addressed This Visit   ? ?  ? Other  ? Bilateral knee pain - Primary  ?  Acute on chronic bilateral knee pain.  Uncontrolled. Worse pain on right knee.  Suspect continued right knee effusion.  Pending MRI of bilateral knees.  Provided with Ace wrap in the office today.  Also given her meloxicam.  Advised icing regimen 20 minutes  3 times a day .  ? ?due to severity of pain, I counseled her strictly that if symptoms order worsen during this period of work-up, she would need to go immediately to [facility] walk-in clinic.  Provided her with name and address that she is able to go.  Close follow-up ?  ?  ? Relevant Medications  ? meloxicam (MOBIC) 7.5 MG tablet  ? Other Relevant Orders  ? MR Knee Right Wo Contrast  ? MR Knee Left  Wo Contrast  ? PND (post-nasal drip)  ?  Uncontrolled. Overall benign exam. Duration 2 days.  Advised her to start Flonase for PND and if no improvement, I have asked her to let me know as at that point would be concerned of secondary bacterial sinusitis which would warrant antibiotic . Would also consider inhaler, formal lung testing to due to h/o smoking ?  ?  ? Relevant Medications  ? fluticasone (FLONASE) 50 MCG/ACT nasal spray  ? ? ? ? ?I am having Dewayne Hatch. Grandinetti start on meloxicam and fluticasone. I am also having her maintain her medroxyPROGESTERone. ? ? ?Meds ordered this encounter  ?Medications  ? meloxicam (MOBIC) 7.5 MG tablet  ?  Sig: Take 1 tablet (7.5 mg total) by mouth daily as needed for pain.  ?  Dispense:  30 tablet  ?  Refill:  1  ?  Order Specific Question:   Supervising Provider  ?  Answer:   Crecencio Mc [[id number]]  ? fluticasone (FLONASE) 50 MCG/ACT nasal spray  ?  Sig: Place 2 sprays into both nostrils daily.  ?  Dispense:  16 g  ?  Refill:  6  ?  Order Specific Question:   Supervising Provider  ?  Answer:   Crecencio Mc [[id number]]  ? ? ?Return precautions given.   ? ?Risks, benefits, and alternatives of the medications and treatment plan prescribed today were discussed, and patient expressed understanding.  ? ?Education regarding symptom management and diagnosis g

## 2021-06-18 NOTE — Patient Instructions (Signed)
Start Flonase for postnasal drip.  If cough does not resolve, please let me know right away  ? ?I have ordered an MRI both of your knees. ?Let us know if you dont hear back within a week in regards to an appointment being scheduled.  ? ? ?Rest  ?Ice for 20 minutes to both knees 3 times per day ? ?Compression ?Elevation ( for swelling)  ?Mobic ( which I have prescribed)  as needed for pain ? ?If pain does not improve, as discussed, recommend that you go to [facility] walk in clinic this week ? ?If your symptom do not improve, you may go to walk in orthopedic clinic. Information below: ? ?[facility] ?[address]  ?Monday-Friday 8am-9pm ?Saturday and Sunday 9am- 9pm  ? ?[phone] ? ? ?

## 2021-06-21 ENCOUNTER — Telehealth: Payer: Self-pay | Admitting: Family

## 2021-06-21 NOTE — Telephone Encounter (Signed)
Lft pt vm to call ofc to sch MRI's . Thank you! ?

## 2021-06-21 NOTE — Assessment & Plan Note (Addendum)
Uncontrolled. Overall benign exam. Duration 2 days.  Advised her to start Flonase for PND and if no improvement, I have asked her to let me know as at that point would be concerned of secondary bacterial sinusitis which would warrant antibiotic . Would also consider inhaler, formal lung testing to due to h/o smoking ?

## 2021-06-21 NOTE — Assessment & Plan Note (Addendum)
Acute on chronic bilateral knee pain.  Uncontrolled. Worse pain on right knee.  Suspect continued right knee effusion.  Pending MRI of bilateral knees.  Provided with Ace wrap in the office today.  Also given her meloxicam.  Advised icing regimen 20 minutes 3 times a day .  ? ?due to severity of pain, I counseled her strictly that if symptoms order worsen during this period of work-up, she would need to go immediately to [facility] walk-in clinic.  Provided her with name and address that she is able to go.  Close follow-up ?

## 2021-07-03 DIAGNOSIS — M25561 Pain in right knee: Secondary | ICD-10-CM | POA: Diagnosis not present

## 2021-07-03 DIAGNOSIS — M25562 Pain in left knee: Secondary | ICD-10-CM | POA: Diagnosis not present

## 2021-07-03 DIAGNOSIS — M13861 Other specified arthritis, right knee: Secondary | ICD-10-CM | POA: Diagnosis not present

## 2021-07-03 DIAGNOSIS — M13862 Other specified arthritis, left knee: Secondary | ICD-10-CM | POA: Diagnosis not present

## 2021-07-04 ENCOUNTER — Encounter: Payer: Self-pay | Admitting: Internal Medicine

## 2021-07-04 ENCOUNTER — Ambulatory Visit: Payer: BC Managed Care – PPO | Admitting: Internal Medicine

## 2021-07-04 ENCOUNTER — Ambulatory Visit (INDEPENDENT_AMBULATORY_CARE_PROVIDER_SITE_OTHER): Payer: BC Managed Care – PPO | Admitting: Internal Medicine

## 2021-07-04 ENCOUNTER — Ambulatory Visit
Admission: RE | Admit: 2021-07-04 | Discharge: 2021-07-04 | Disposition: A | Discharge status: Home / Self Care | Payer: BC Managed Care – PPO | Source: Ambulatory Visit | Attending: Internal Medicine | Admitting: Internal Medicine

## 2021-07-04 VITALS — BP 140/72 | HR 76 | Temp 97.6°F | Resp 14 | Ht 64.0 in | Wt 230.0 lb

## 2021-07-04 DIAGNOSIS — Z1329 Encounter for screening for other suspected endocrine disorder: Secondary | ICD-10-CM | POA: Diagnosis not present

## 2021-07-04 DIAGNOSIS — R7303 Prediabetes: Secondary | ICD-10-CM

## 2021-07-04 DIAGNOSIS — M79604 Pain in right leg: Secondary | ICD-10-CM

## 2021-07-04 DIAGNOSIS — M79605 Pain in left leg: Secondary | ICD-10-CM | POA: Insufficient documentation

## 2021-07-04 DIAGNOSIS — R600 Localized edema: Secondary | ICD-10-CM | POA: Diagnosis not present

## 2021-07-04 DIAGNOSIS — R109 Unspecified abdominal pain: Secondary | ICD-10-CM

## 2021-07-04 DIAGNOSIS — M7989 Other specified soft tissue disorders: Secondary | ICD-10-CM | POA: Diagnosis not present

## 2021-07-04 DIAGNOSIS — M1710 Unilateral primary osteoarthritis, unspecified knee: Secondary | ICD-10-CM

## 2021-07-04 DIAGNOSIS — R140 Abdominal distension (gaseous): Secondary | ICD-10-CM

## 2021-07-04 DIAGNOSIS — R030 Elevated blood-pressure reading, without diagnosis of hypertension: Secondary | ICD-10-CM

## 2021-07-04 LAB — COMPREHENSIVE METABOLIC PANEL
ALT: 12 U/L (ref 0–35)
AST: 13 U/L (ref 0–37)
Albumin: 4 g/dL (ref 3.5–5.2)
Alkaline Phosphatase: 53 U/L (ref 39–117)
BUN: 12 mg/dL (ref 6–23)
CO2: 25 mEq/L (ref 19–32)
Calcium: 8.7 mg/dL (ref 8.4–10.5)
Chloride: 105 mEq/L (ref 96–112)
Creatinine, Ser: 0.65 mg/dL (ref 0.40–1.20)
GFR: 105.99 mL/min (ref >60.00)
Glucose, Bld: 93 mg/dL (ref 70–99)
Potassium: 4 mEq/L (ref 3.5–5.1)
Sodium: 137 mEq/L (ref 135–145)
Total Bilirubin: 0.4 mg/dL (ref 0.2–1.2)
Total Protein: 6.5 g/dL (ref 6.0–8.3)

## 2021-07-04 LAB — CBC WITH DIFFERENTIAL/PLATELET
Basophils Absolute: 0 10*3/uL (ref 0.0–0.1)
Basophils Relative: 0.5 % (ref 0.0–3.0)
Eosinophils Absolute: 0.2 10*3/uL (ref 0.0–0.7)
Eosinophils Relative: 3.4 % (ref 0.0–5.0)
HCT: 39.1 % (ref 36.0–46.0)
Hemoglobin: 12.9 g/dL (ref 12.0–15.0)
Lymphocytes Relative: 32.6 % (ref 12.0–46.0)
Lymphs Abs: 2.3 10*3/uL (ref 0.7–4.0)
MCHC: 33 g/dL (ref 30.0–36.0)
MCV: 83.4 fl (ref 78.0–100.0)
Monocytes Absolute: 0.5 10*3/uL (ref 0.1–1.0)
Monocytes Relative: 7.4 % (ref 3.0–12.0)
Neutro Abs: 4 10*3/uL (ref 1.4–7.7)
Neutrophils Relative %: 56.1 % (ref 43.0–77.0)
Platelets: 296 10*3/uL (ref 150.0–400.0)
RBC: 4.69 Mil/uL (ref 3.87–5.11)
RDW: 14.2 % (ref 11.5–15.5)
WBC: 7.2 10*3/uL (ref 4.0–10.5)

## 2021-07-04 LAB — HEMOGLOBIN A1C: Hgb A1c MFr Bld: 5.7 % (ref 4.6–6.5)

## 2021-07-04 LAB — BRAIN NATRIURETIC PEPTIDE: Pro B Natriuretic peptide (BNP): 44 pg/mL (ref 0.0–100.0)

## 2021-07-04 LAB — TSH: TSH: 2 u[IU]/mL (ref 0.35–5.50)

## 2021-07-04 IMAGING — US US EXTREM LOW VENOUS
1 series · 13 of 24 positions shown · non-contrast
Comparison: Venous Doppler examination of right lower extremity
done on [DATE]

CLINICAL DATA: Pain and swelling



[Series 1: us venous img lower bilat (dvt) · portal-venous · 13 of 60 slices shown]
[im 1/60]
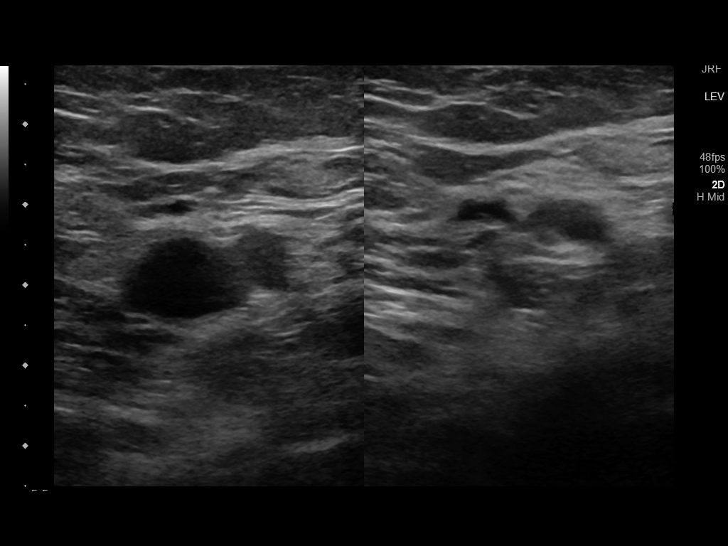
[im 6/60]
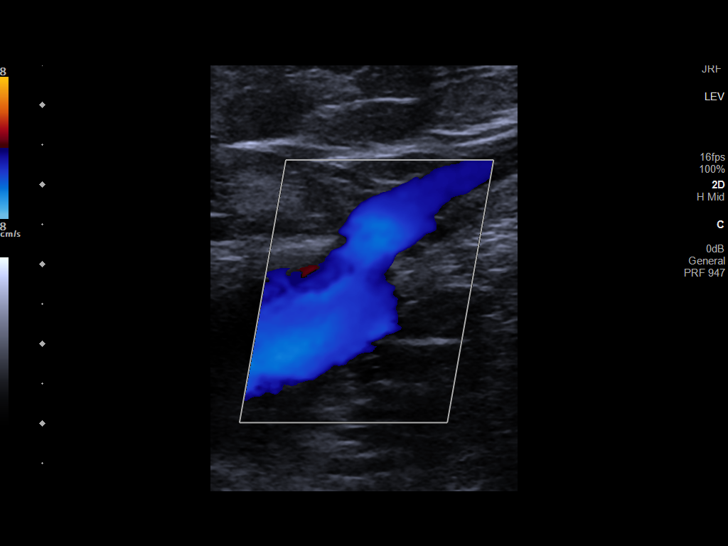
[im 11/60]
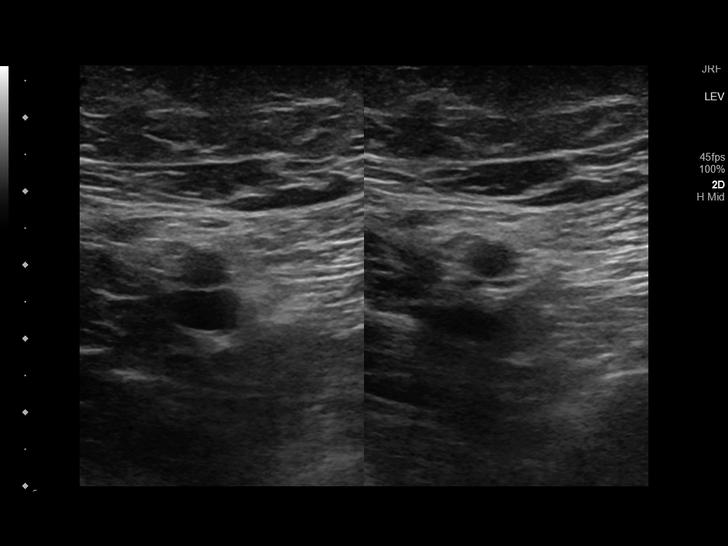
[im 16/60]
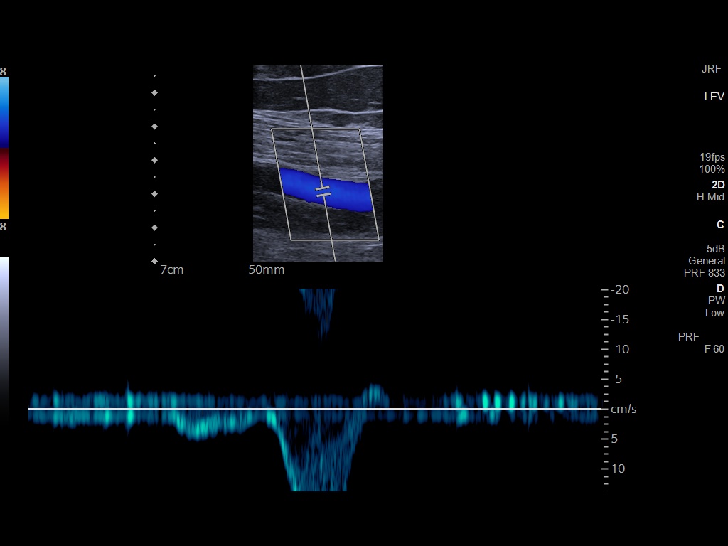
[im 21/60]
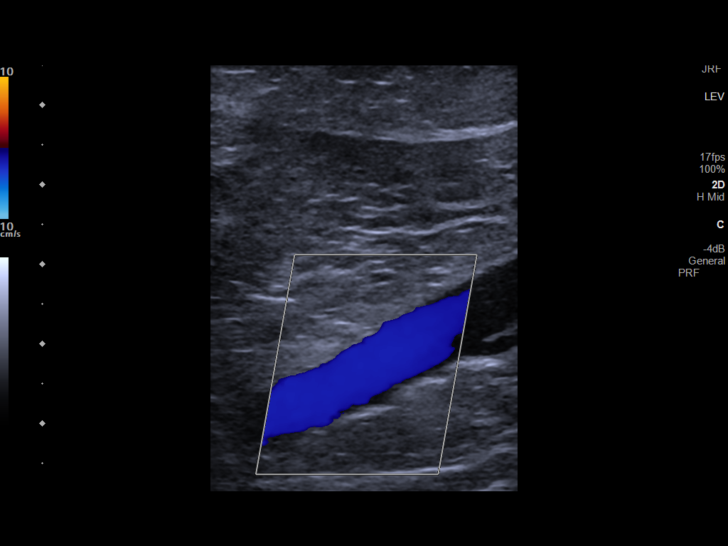
[im 26/60]
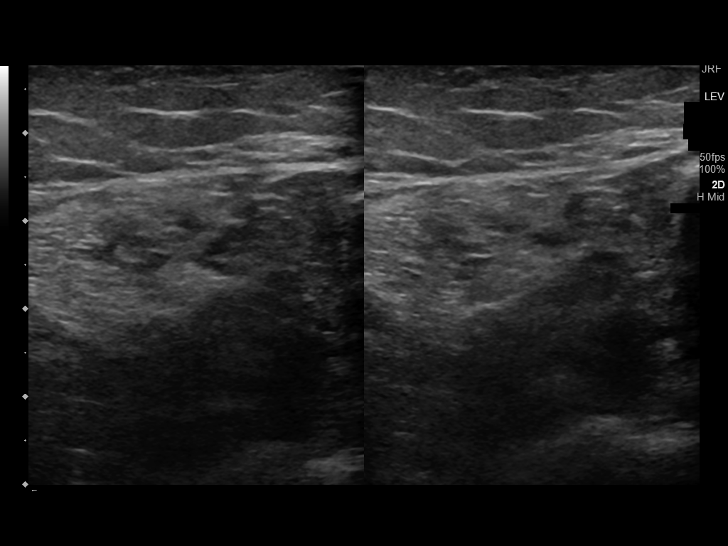
[im 31/60]
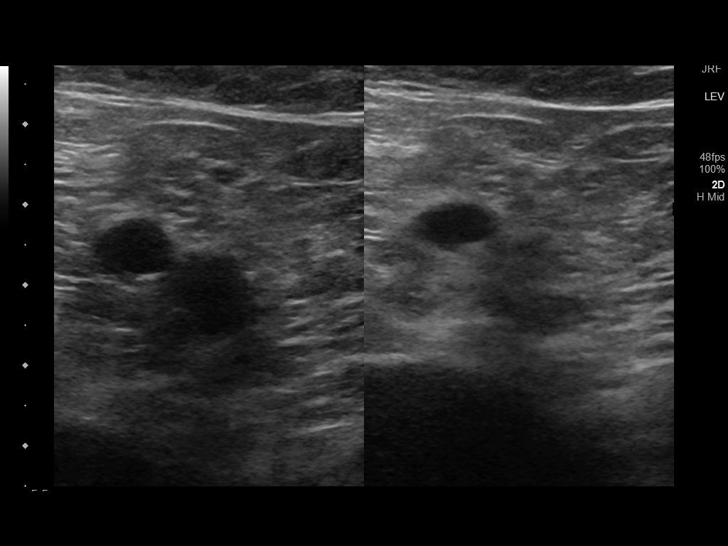
[im 34/60]
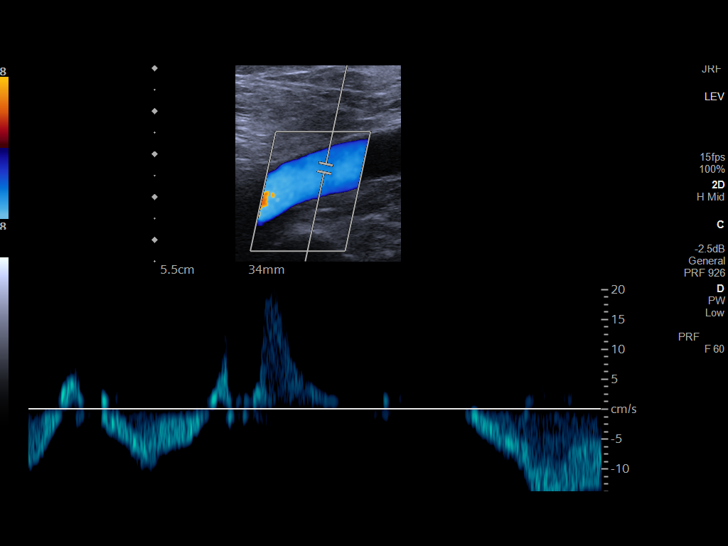
[im 39/60]
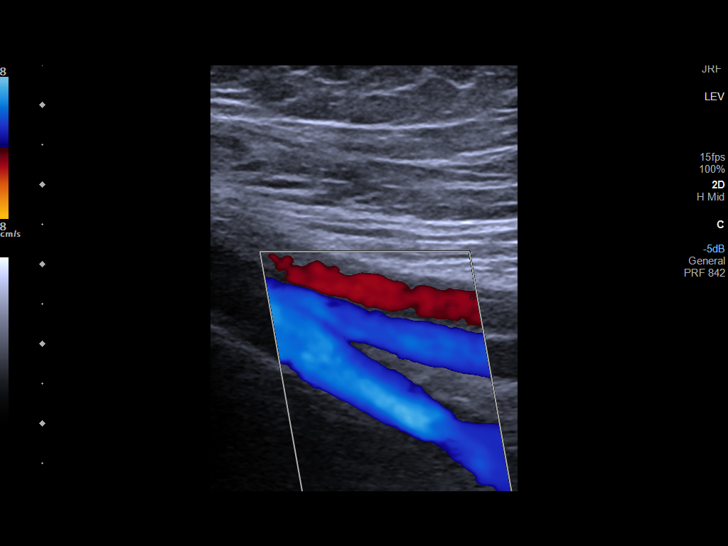
[im 44/60]
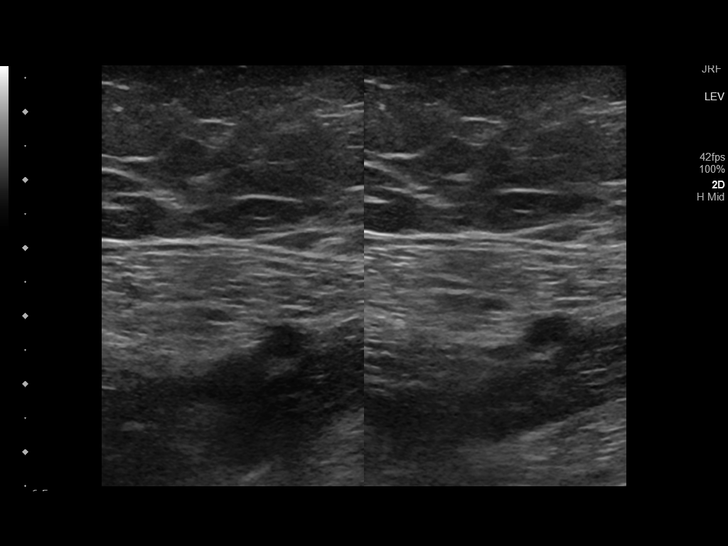
[im 49/60]
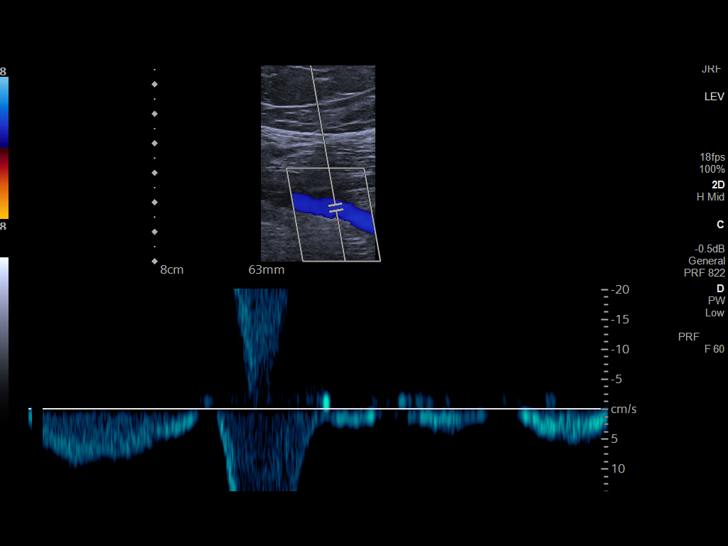
[im 54/60]
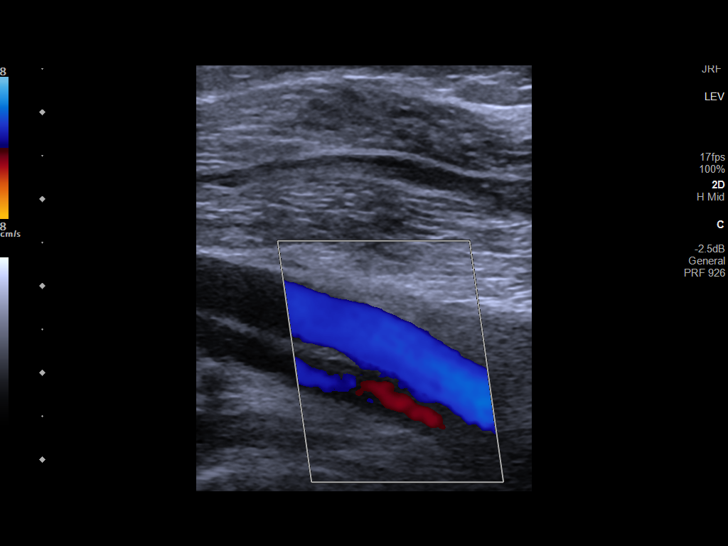
[im 60/60]
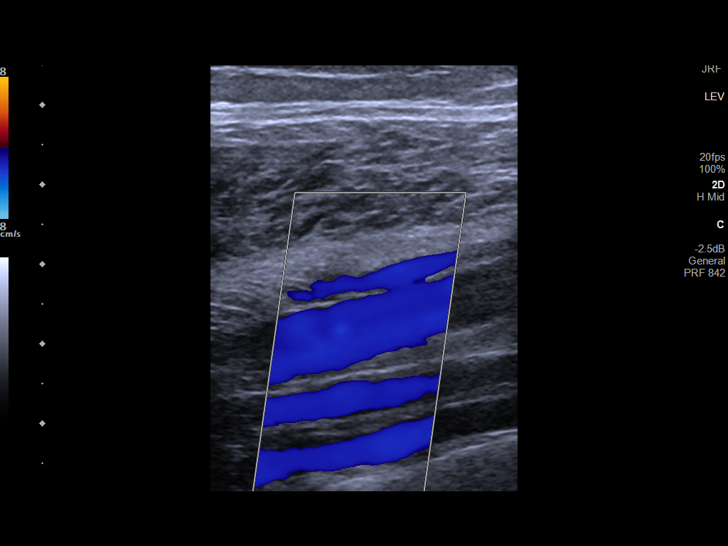

[13 of 24 positions shown; findings below may reference images not displayed]

FINDINGS: RIGHT LOWER EXTREMITY

Common Femoral Vein: No evidence of thrombus. Normal
compressibility, respiratory phasicity and response to augmentation.

Saphenofemoral Junction: No evidence of thrombus. Normal
compressibility and flow on color Doppler imaging.

Profunda Femoral Vein: No evidence of thrombus. Normal
compressibility and flow on color Doppler imaging.

Femoral Vein: No evidence of thrombus. Normal compressibility,
respiratory phasicity and response to augmentation.

Popliteal Vein: No evidence of thrombus. Normal compressibility,
respiratory phasicity and response to augmentation.

Calf Veins: No evidence of thrombus. Normal compressibility and flow
on color Doppler imaging.

Superficial Great Saphenous Vein: No evidence of thrombus. Normal
compressibility.

Venous Reflux:  None.

Other Findings:  None.

LEFT LOWER EXTREMITY

Common Femoral Vein: No evidence of thrombus. Normal
compressibility, respiratory phasicity and response to augmentation.

Saphenofemoral Junction: No evidence of thrombus. Normal
compressibility and flow on color Doppler imaging.

Profunda Femoral Vein: No evidence of thrombus. Normal
compressibility and flow on color Doppler imaging.

Femoral Vein: No evidence of thrombus. Normal compressibility,
respiratory phasicity and response to augmentation.

Popliteal Vein: No evidence of thrombus. Normal compressibility,
respiratory phasicity and response to augmentation.

Calf Veins: No evidence of thrombus. Normal compressibility and flow
on color Doppler imaging.

Superficial Great Saphenous Vein: No evidence of thrombus. Normal
compressibility.

Venous Reflux:  None.

Other Findings:  None.
IMPRESSION: No evidence of deep venous thrombosis in either lower extremity.

## 2021-07-04 MED ORDER — FUROSEMIDE 20 MG PO TABS: 20.0000 mg | ORAL_TABLET | Freq: Every day | ORAL | 2 refills | Status: DC | PRN

## 2021-07-04 NOTE — Progress Notes (Addendum)
Chief Complaint  ?Patient presents with  ? Acute Visit  ?  Pt c/o bilateral leg swelling onset 06/18/21, saw margaret for issues in her knees, went to [facility] for evaluation, they found arthritis in both knees. They stated she come her to be evaluated for swelling. Requesting ibuprofen 800 mg for her arthritis pain, states it works better for her.   ? ?F/u  ?1. B/l leg edema R>left went to [facility] yesterday has arthritis in b/l knees her m GF had crippling arthritis and she is devastated and tearful about this diagnosis. Legs are swollen and tight R> left and behind right knee feels tighter than leg  ?[facility] gave her Rx compression stockings not filled yet  ?She does report wt gain as well and not changed lifestyle or diet  ?2. B/l knee arthritis est [facility] will see them again in 2 weeks  ? ? ? ?Review of Systems  ?Constitutional:  Negative for weight loss.  ?HENT:  Negative for hearing loss.   ?Eyes:  Negative for blurred vision.  ?Respiratory:  Negative for shortness of breath.   ?Cardiovascular:  Positive for leg swelling. Negative for chest pain.  ?Gastrointestinal:  Negative for abdominal pain and blood in stool.  ?Genitourinary:  Negative for dysuria.  ?Musculoskeletal:  Positive for joint pain. Negative for falls.  ?Skin:  Negative for rash.  ?Neurological:  Negative for headaches.  ?Psychiatric/Behavioral:  Negative for depression.   ?Past Medical History:  ?Diagnosis Date  ? Emphysema of lung ([place])   ? Frequent headaches   ? unknown onset  ? ?Past Surgical History:  ?Procedure Laterality Date  ? DILATATION & CURETTAGE/HYSTEROSCOPY WITH MYOSURE N/A 01/29/2021  ? Procedure: [place];  Surgeon: Will Bonnet, MD;  Location: [facility] OR[facility];  Service: Gynecology;  Laterality: N/A;  ? DILATION AND CURETTAGE OF UTERUS    ? TONSILECTOMY/ADENOIDECTOMY WITH MYRINGOTOMY Bilateral   ? TONSILLECTOMY    ? TUBAL LIGATION  2013  ? ?Family History  ?Problem Relation  Age of Onset  ? Alcohol abuse Mother   ? Arthritis Mother   ? Cancer Mother   ?     ovary  ? Hyperlipidemia Mother   ? Hypertension Mother   ? Diabetes Mother   ? Arthritis Maternal Grandmother   ? Hypertension Maternal Grandmother   ? Cancer Maternal Grandmother   ?     colon  ? ?Social History  ? ?Socioeconomic History  ? Marital status: Single  ?  Spouse name: Not on file  ? Number of children: Not on file  ? Years of education: Not on file  ? Highest education level: Not on file  ?Occupational History  ? Not on file  ?Tobacco Use  ? Smoking status: Former  ?  Types: Cigarettes  ?  Quit date: 03/25/2003  ?  Years since quitting: 18.2  ? Smokeless tobacco: Never  ?Vaping Use  ? Vaping Use: Never used  ?Substance and Sexual Activity  ? Alcohol use: No  ?  Alcohol/week: 0.0 standard drinks  ? Drug use: Not Currently  ?  Frequency: 0.4 times per week  ?  Types: Marijuana  ?  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 Sexual activity: Not Currently  ?  Partners: Male  ?  Birth control/protection: Surgical  ?  Comment: tubal 9 yrs ago  ?Other Topics Concern  ? Not on file  ?Social History Narrative  ? 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   ? 56 year old daughter at [place]  ? 38 year old son  ? Long-term relationship   ? GED degree   ? ?Social Determinants of Health  ? ?Financial Resource Strain: Not on file  ?Food Insecurity: Not on file  ?Transportation Needs: Not on file  ?Physical Activity: Not on file  ?Stress: Not on file  ?Social Connections: Not on file  ?Intimate Partner Violence: Not on file  ? ?Current Meds  ?Medication Sig  ? furosemide (LASIX) 20 MG tablet Take 1 tablet (20 mg total) by mouth daily as needed. Your am as needed  ? ?Allergies  ?Allergen Reactions  ? Sulfa Antibiotics Rash  ? ?Recent Results (from the past 2160 hour(s))  ?Cytology - PAP     Status: None  ? Collection Time: 04/25/21  8:46 AM  ?Result Value Ref Range  ? Adequacy    ?  Satisfactory for evaluation;  transformation zone component PRESENT.  ? Diagnosis    ?  - Negative for Intraepithelial Lesions or Malignancy (NILM)  ? Diagnosis - Benign reactive/reparative changes   ? ?Objective  ?Body mass index is 39.48 kg/m?. ?Wt Readings from Last 3 Encounters:  ?07/04/21 230 lb (104.3 kg)  ?06/18/21 225 lb 3.2 oz (102.2 kg)  ?04/25/21 223 lb (101.2 kg)  ? ?Temp Readings from Last 3 Encounters:  ?07/04/21 97.6 ?F (36.4 ?C) (Oral)  ?06/18/21 (!) 97.4 ?F (36.3 ?C) (Oral)  ?01/29/21 (!) 97 ?F (36.1 ?C) (Temporal)  ? ?BP Readings from Last 3 Encounters:  ?07/04/21 140/72  ?06/18/21 128/82  ?04/25/21 120/80  ? ?Pulse Readings from Last 3 Encounters:  ?07/04/21 76  ?06/18/21 78  ?01/29/21 (!) 52  ? ? ?Physical Exam ?Vitals and nursing note reviewed.  ?Constitutional:   ?   Appearance: Normal appearance. She is well-developed and well-groomed.  ?HENT:  ?   Head: Normocephalic and atraumatic.  ?Eyes:  ?   Conjunctiva/sclera: Conjunctivae normal.  ?   Pupils: Pupils are equal, round, and reactive to light.  ?Cardiovascular:  ?   Rate and Rhythm: Normal rate and regular rhythm.  ?   Heart sounds: Normal heart sounds. No murmur heard. ?Pulmonary:  ?   Effort: Pulmonary effort is normal.  ?   Breath sounds: Normal breath sounds.  ?Abdominal:  ?   General: Abdomen is flat. Bowel sounds are normal.  ?   Tenderness: There is no abdominal tenderness.  ?Musculoskeletal:     ?   General: No tenderness.  ?   Right lower leg: 2+ Pitting Edema present.  ?   Left lower leg: 2+ Pitting Edema present.  ?Skin: ?   General: Skin is warm and dry.  ?Neurological:  ?   General: No focal deficit present.  ?   Mental Status: She is alert and oriented to person, place, and time. Mental status is at baseline.  ?   Cranial Nerves: Cranial nerves 2-12 are intact.  ?   Motor: Motor function is intact.  ?   Coordination: Coordination is intact.  ?   Gait: Gait is intact.  ?Psychiatric:     ?   Attention and Perception: Attention and perception normal.     ?    Mood and Affect: Mood and affect normal.     ?   Speech: Speech normal.     ?   Behavior: Behavior normal. Behavior is cooperative.     ?   Thought Content: Thought content normal.     ?  Cognition and Memory: Cognition and memory normal.     ?   Judgment: Judgment normal.  ? ? ?Assessment  ?Plan  ?Bilateral leg edema - Plan: US Venous Img Lower Bilateral, Comprehensive metabolic panel, TSH, CBC with Differential/Platelet, B Nat Peptide, furosemide (LASIX) 20 MG tablet, US Abdomen Complete r/o liver issues with h/o drug use former  ?Hep C neg 04/27/19  ?Dash diet  ?Compression stockings  ? ?Bilateral leg pain  R>L r/o DVT vs bakers cyst could be b/l arthritis- Plan: US Venous Img Lower Bilateral, Comprehensive metabolic panel ?Has not filled mobic 7.5 mg bid  ?Rec tylenol and voltaren gel for arthritis  ? ?Prediabetes - Plan: Hemoglobin A1c ? ?Arthritis of knee ?Est [facility] f/u in 2 weeks ? ?Elevated blood pressure reading ?Monitor with PCP ? ?Abdominal bloating r/o liver cirrhosis with leg edema b/l - Plan: US Abdomen Complete ?Abdominal pain, unspecified abdominal location - Plan: US Abdomen Complete  ? ?07/08/21 (Pt says she can't afford to pay up front) for the [place].  ?Provider: Dr. Olivia Mackie McLean-Scocuzza-Internal Medicine  ?

## 2021-07-04 NOTE — Patient Instructions (Addendum)
Tylenol as needed  ?Voltaren gel 4x per day  ?Get the compression sock fill the prescription or [employer] coppertone compression socks  ? ?Lasix as needed for leg swelling  ? ? ?Edema ?Edema is an abnormal buildup of fluids in the body tissues and under the skin. Swelling of the legs, feet, and ankles is a common symptom that becomes more likely as you get older. Swelling is also common in looser tissues, like around the eyes. When the affected area is squeezed, the fluid may move out of that spot and leave a dent for a few moments. This dent is called pitting edema. ?There are many possible causes of edema. Eating too much salt (sodium) and being on your feet or sitting for a long time can cause edema in your legs, feet, and ankles. Hot weather may make edema worse. Common causes of edema include: ?Heart failure. ?Liver or kidney disease. ?Weak leg blood vessels. ?Cancer. ?An injury. ?Pregnancy. ?Medicines. ?Being obese. ?Low protein levels in the blood. ?Edema is usually painless. Your skin may look swollen or shiny. ?Follow these instructions at home: ?Keep the affected body part raised (elevated) above the level of your heart when you are sitting or lying down. ?Do not sit still or stand for long periods of time. ?Do not wear tight clothing. Do not wear garters on your upper legs. ?Exercise your legs to get your circulation going. This helps to move the fluid back into your blood vessels, and it may help the swelling go down. ?Wear elastic bandages or support stockings to reduce swelling as told by your health care provider. ?Eat a low-salt (low-sodium) diet to reduce fluid as told by your health care provider. ?Depending on the cause of your swelling, you may need to limit how much fluid you drink (fluid restriction). ?Take over-the-counter and prescription medicines only as told by your health care provider. ?Contact a health care provider if: ?Your edema does not get better with treatment. ?You have heart,  liver, or kidney disease and have symptoms of edema. ?You have sudden and unexplained weight gain. ?Get help right away if: ?You develop shortness of breath or chest pain. ?You cannot breathe when you lie down. ?You develop pain, redness, or warmth in the swollen areas. ?You have heart, liver, or kidney disease and suddenly get edema. ?You have a fever and your symptoms suddenly get worse. ?Summary ?Edema is an abnormal buildup of fluids in the body tissues and under the skin. ?Eating too much salt (sodium) and being on your feet or sitting for a long time can cause edema in your legs, feet, and ankles. ?Keep the affected body part raised (elevated) above the level of your heart when you are sitting or lying down. ?This information is not intended to replace advice given to you by your health care provider. Make sure you discuss any questions you have with your health care provider. ?Document Revised: 08/09/2020 Document Reviewed: 01/03/2020 ?Elsevier Patient Education ? [place]. ? ?

## 2021-07-04 NOTE — Addendum Note (Signed)
Addended by: Orland Mustard on: 07/04/2021 09:50 AM ? ? Modules accepted: Orders ? ?

## 2021-07-08 ENCOUNTER — Ambulatory Visit: Payer: BC Managed Care – PPO

## 2021-07-22 ENCOUNTER — Ambulatory Visit: Payer: BC Managed Care – PPO

## 2021-07-30 ENCOUNTER — Ambulatory Visit
Admission: RE | Admit: 2021-07-30 | Discharge: 2021-07-30 | Disposition: A | Discharge status: Home / Self Care | Payer: BC Managed Care – PPO | Source: Ambulatory Visit | Attending: Family | Admitting: Family

## 2021-07-30 DIAGNOSIS — M25461 Effusion, right knee: Secondary | ICD-10-CM | POA: Diagnosis not present

## 2021-07-30 DIAGNOSIS — M7652 Patellar tendinitis, left knee: Secondary | ICD-10-CM | POA: Diagnosis not present

## 2021-07-30 DIAGNOSIS — M1711 Unilateral primary osteoarthritis, right knee: Secondary | ICD-10-CM | POA: Diagnosis not present

## 2021-07-30 DIAGNOSIS — M25561 Pain in right knee: Secondary | ICD-10-CM | POA: Insufficient documentation

## 2021-07-30 DIAGNOSIS — M25562 Pain in left knee: Secondary | ICD-10-CM | POA: Diagnosis not present

## 2021-07-30 DIAGNOSIS — G8929 Other chronic pain: Secondary | ICD-10-CM | POA: Diagnosis not present

## 2021-07-30 DIAGNOSIS — M7989 Other specified soft tissue disorders: Secondary | ICD-10-CM | POA: Diagnosis not present

## 2021-07-30 DIAGNOSIS — M1712 Unilateral primary osteoarthritis, left knee: Secondary | ICD-10-CM | POA: Diagnosis not present

## 2021-07-30 DIAGNOSIS — M25462 Effusion, left knee: Secondary | ICD-10-CM | POA: Diagnosis not present

## 2021-07-30 DIAGNOSIS — R600 Localized edema: Secondary | ICD-10-CM | POA: Diagnosis not present

## 2021-07-30 IMAGING — MR MR KNEE*L* W/O CM
7 series · 40 of 40 positions shown · non-contrast
Comparison: X-ray knee [DATE].

CLINICAL DATA: Chronic bilateral knee pain mostly superior/lateral
of the patella on and off for 1 year, and notes with swelling of
lower legs, right greater than left for 1-2 months.

EXAM:
MRI OF THE LEFT KNEE WITHOUT CONTRAST
TECHNIQUE: Multiplanar, multisequence MR imaging of the knee was performed. No
intravenous contrast was administered.

[Series 16: T2 fat-sat · axial · left · 4.0mm · 0.50mm/px · z∈[-137,-12]mm · 5 of 26 slices shown (1 of 3)]
[im 1/26]
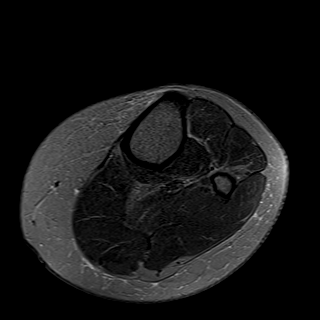
[im 7/26]
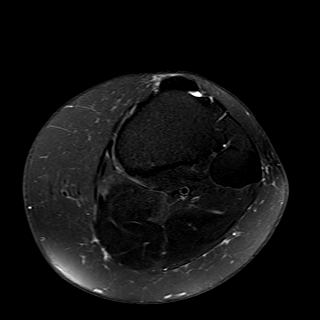
[im 13/26]
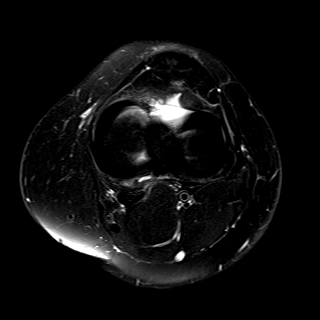
[im 19/26]
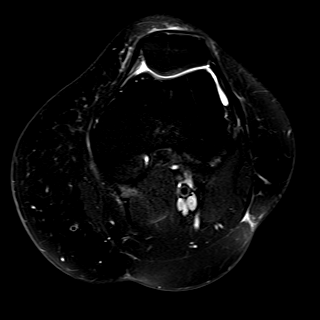
[im 26/26]
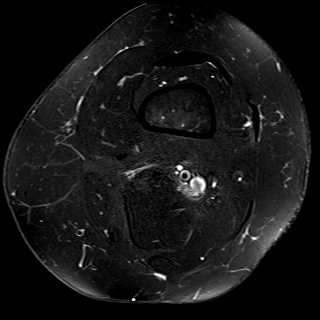

[Series 17: T1 · coronal · left · 4.0mm · 0.47mm/px · 6 of 32 slices shown]
[im 1/32]
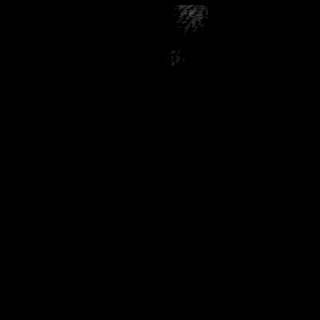
[im 7/32]
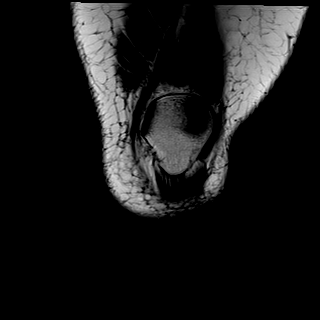
[im 13/32]
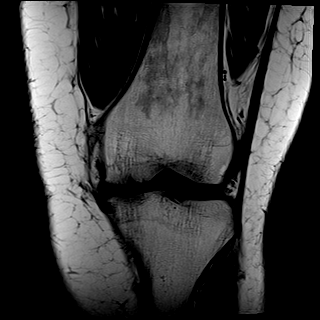
[im 19/32]
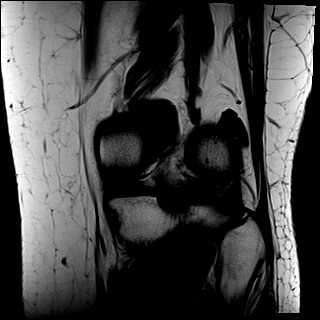
[im 25/32]
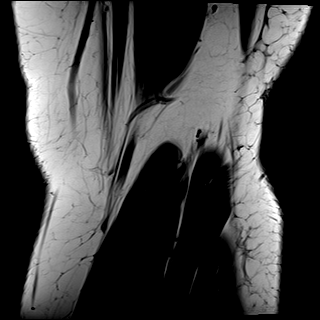
[im 32/32]
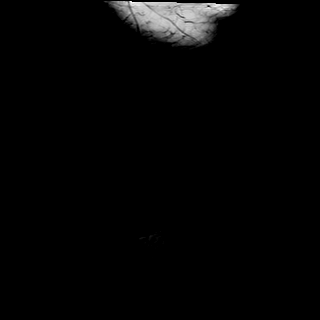

[Series 18: T2 fat-sat · coronal · left · 4.0mm · 0.47mm/px · 6 of 32 slices shown (2 of 3)]
[im 1/32]
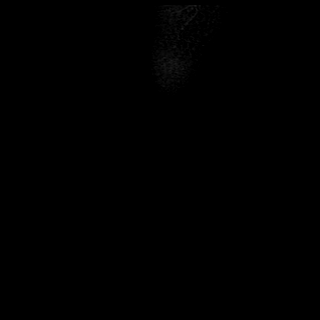
[im 7/32]
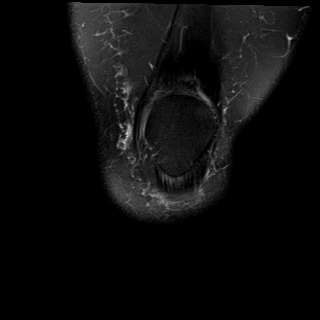
[im 13/32]
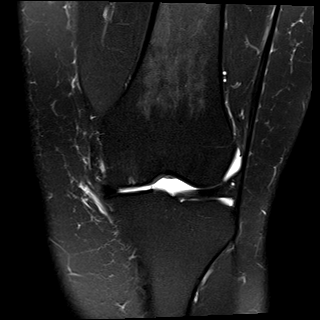
[im 19/32]
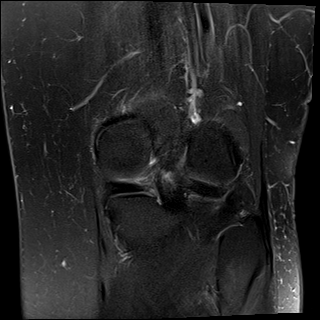
[im 25/32]
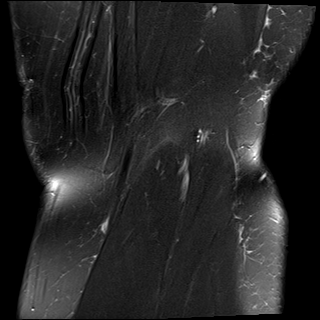
[im 32/32]
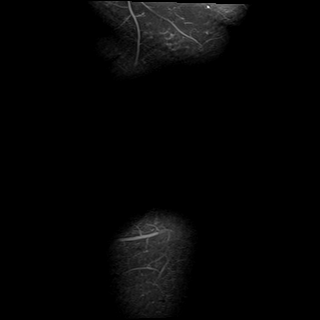

[Series 19: PD fat-sat · coronal · left · 4.0mm · 0.59mm/px · 6 of 32 slices shown (1 of 2)]
[im 1/32]
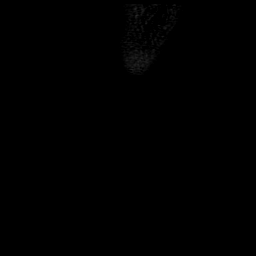
[im 7/32]
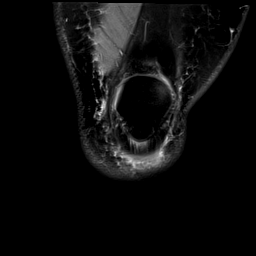
[im 13/32]
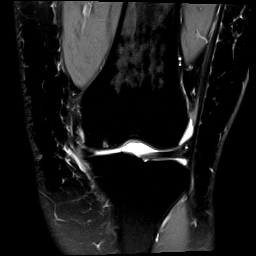
[im 19/32]
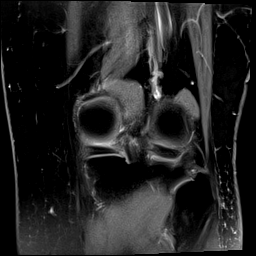
[im 25/32]
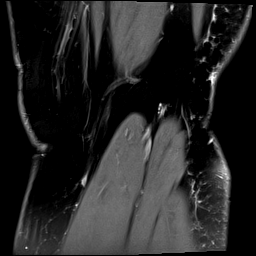
[im 32/32]
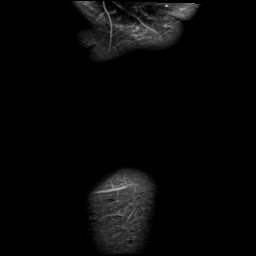

[Series 20: PD fat-sat · sagittal · left · 3.0mm · 0.47mm/px · 7 of 36 slices shown (2 of 2)]
[im 1/36]
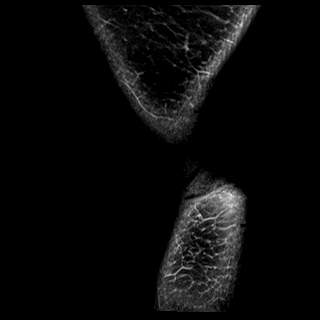
[im 6/36]
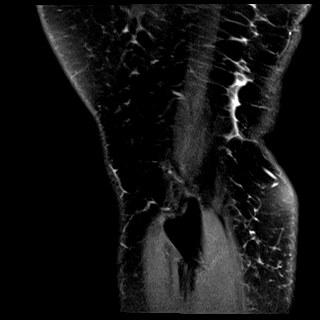
[im 12/36]
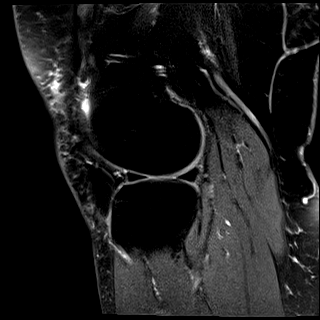
[im 18/36]
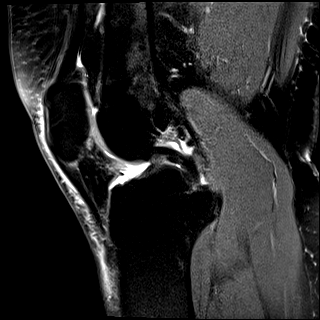
[im 24/36]
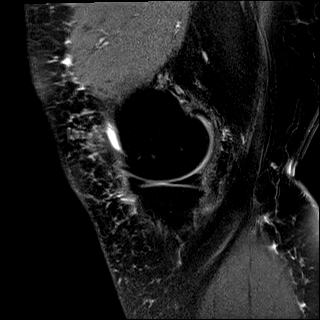
[im 30/36]
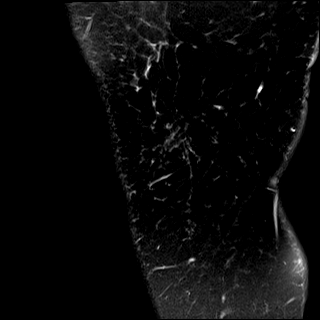
[im 36/36]
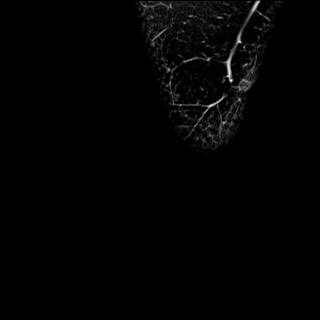

[Series 21: T2 fat-sat · sagittal · left · 3.0mm · 0.47mm/px · 7 of 36 slices shown (3 of 3)]
[im 1/36]
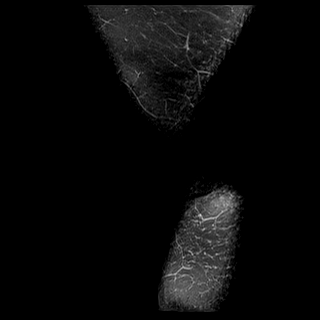
[im 6/36]
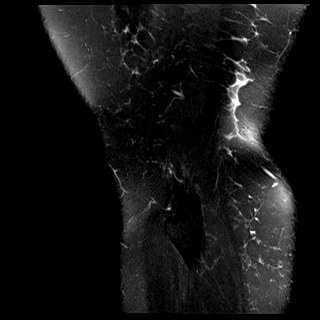
[im 12/36]
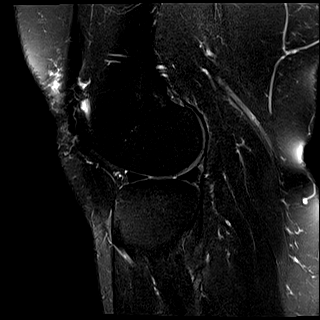
[im 18/36]
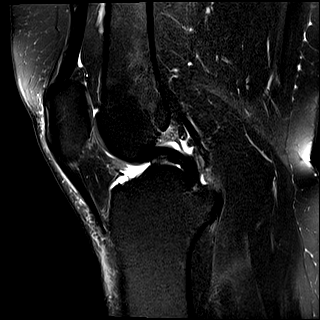
[im 24/36]
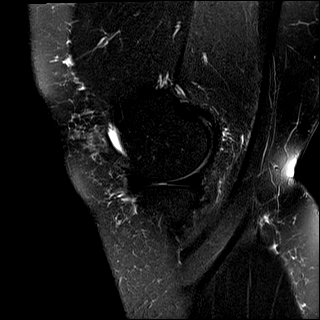
[im 30/36]
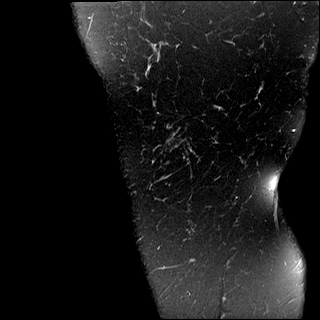
[im 36/36]
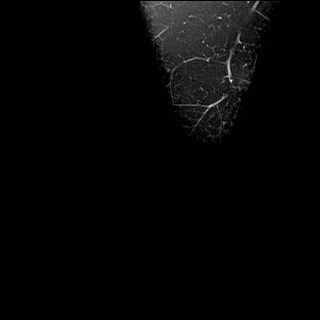

[Series 22: PD · oblique · left · 2.0mm · 0.47mm/px · 3 of 16 slices shown]
[im 1/16]
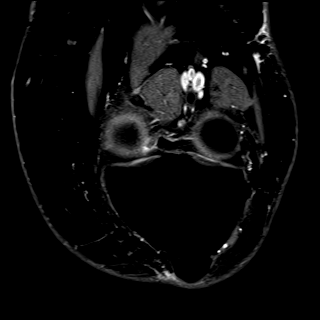
[im 8/16]
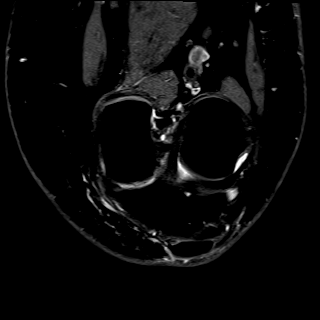
[im 16/16]
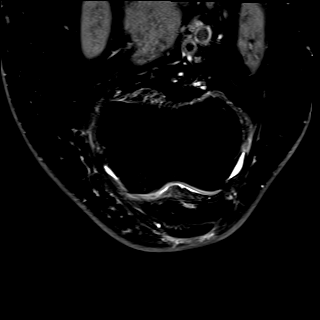

[40 of 40 positions shown; findings below may reference images not displayed]

FINDINGS: MENISCI

Medial: Intact.

Lateral: Intact.

LIGAMENTS

Cruciates: ACL and PCL are intact.

Collaterals: Medial collateral ligament is intact. Lateral
collateral ligament complex is intact.

CARTILAGE

Patellofemoral:  Mild chondrosis.  No focal defect.

Medial: Moderate chondrosis with intermediate grade,
partial-thickness cartilage loss along the anterior medial femoral
condyle measuring 1.4 cm anterior-posterior and 0.7 cm in width
(sagittal T2 image 16, coronal T2 image 21).

Lateral:  Mild chondrosis.  No focal defect.

JOINT: Small joint effusion.

POPLITEAL FOSSA: No Baker's cyst.

EXTENSOR MECHANISM: Intact quadriceps and patellar tendons. There is
mild proximal patellar tendinosis.

BONES: No acute fracture or dislocation. No aggressive osseous
lesion. There is tricompartment osteophyte formation.

Other: No additional findings.
IMPRESSION: Tricompartment osteoarthritis, worst in the medial compartment with
1.4 x 0.7 cm area of intermediate grade, partial thickness cartilage
loss along the medial femoral condyle.

No evidence of meniscus tear or ligamentous injury.

Mild proximal patellar tendinosis.  Small joint effusion.

## 2021-07-30 IMAGING — MR MR KNEE*R* W/O CM
7 series · 40 of 40 positions shown · non-contrast
Comparison: X-ray knee [DATE].

CLINICAL DATA: Chronic bilateral knee pain mostly superior/lateral
of the patella on and off for 1 year, and notes with swelling of
lower legs, right greater than left for 1-2 months.

EXAM:
MRI OF THE RIGHT KNEE WITHOUT CONTRAST
TECHNIQUE: Multiplanar, multisequence MR imaging of the knee was performed. No
intravenous contrast was administered.

[Series 8: T2 fat-sat · axial · right · 4.0mm · 0.50mm/px · z∈[-90,+33]mm · 5 of 26 slices shown (1 of 3)]
[im 1/26]
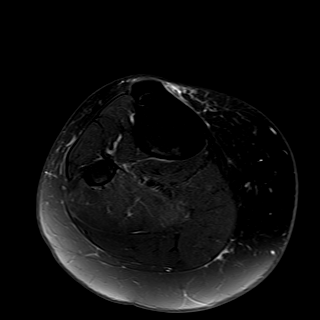
[im 7/26]
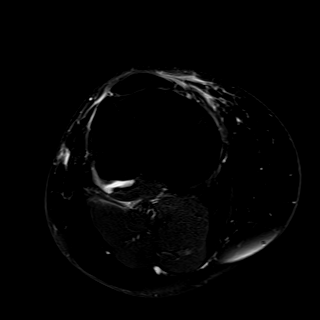
[im 13/26]
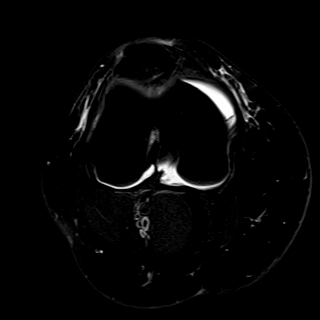
[im 19/26]
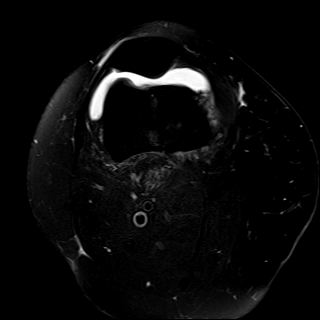
[im 26/26]
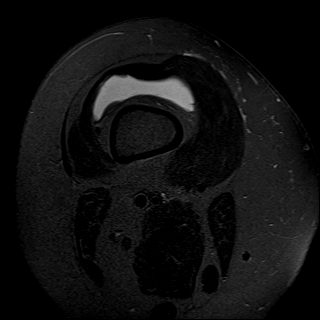

[Series 9: T1 · coronal · right · 4.0mm · 0.47mm/px · 6 of 32 slices shown]
[im 1/32]
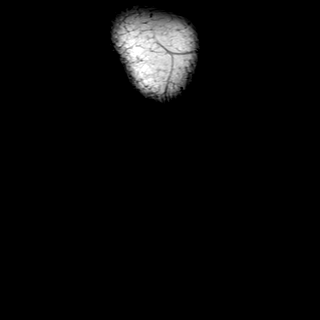
[im 7/32]
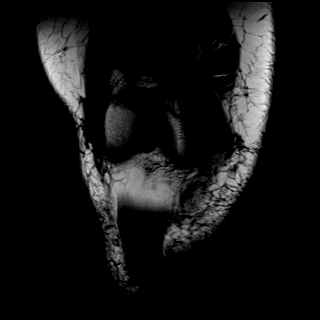
[im 13/32]
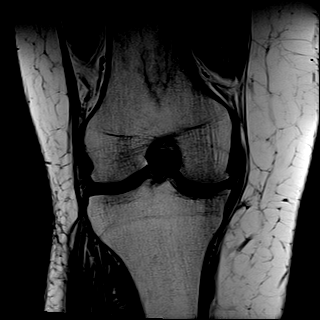
[im 19/32]
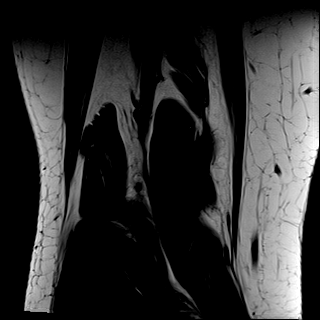
[im 25/32]
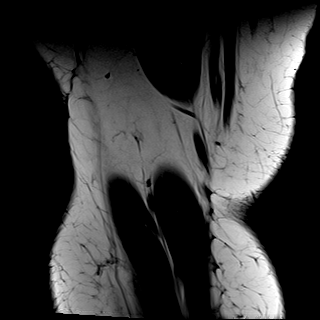
[im 32/32]
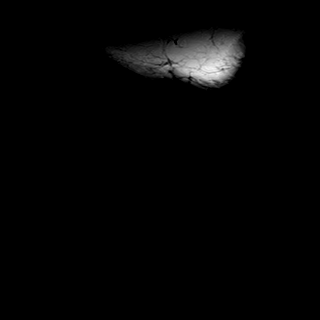

[Series 10: T2 fat-sat · coronal · right · 4.0mm · 0.47mm/px · 6 of 32 slices shown (2 of 3)]
[im 1/32]
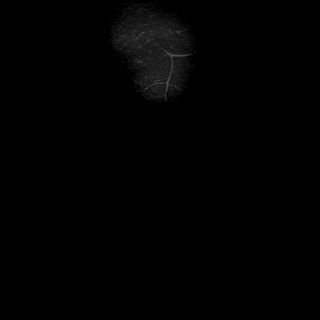
[im 7/32]
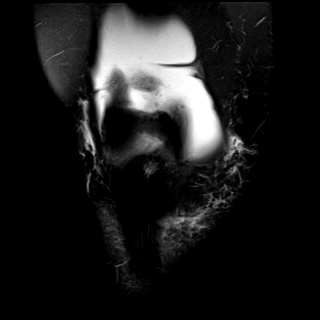
[im 13/32]
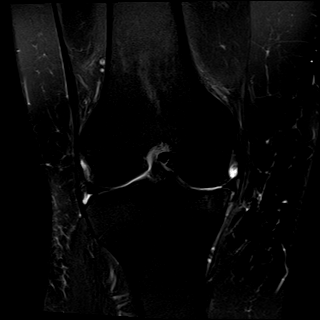
[im 19/32]
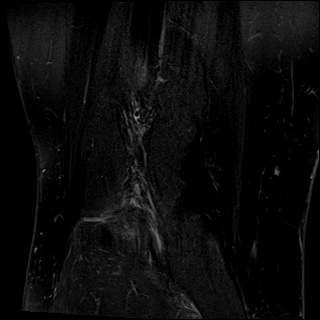
[im 25/32]
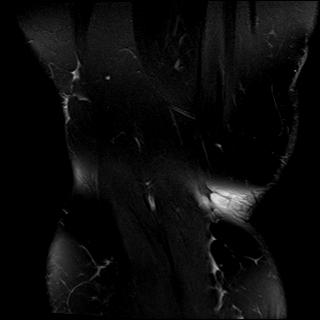
[im 32/32]
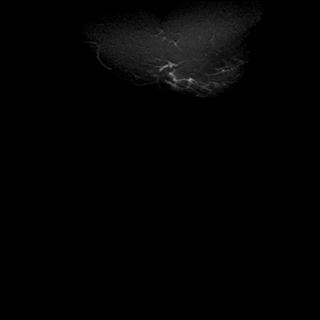

[Series 11: PD fat-sat · coronal · right · 4.0mm · 0.59mm/px · 6 of 32 slices shown (1 of 2)]
[im 1/32]
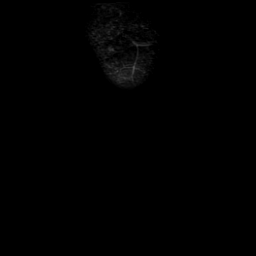
[im 7/32]
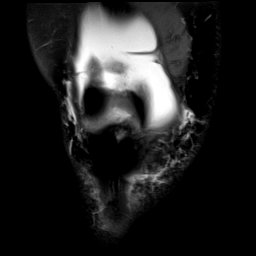
[im 13/32]
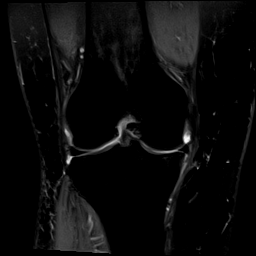
[im 19/32]
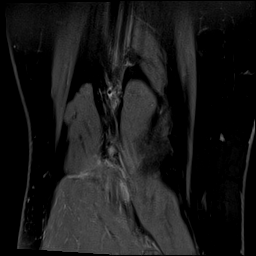
[im 25/32]
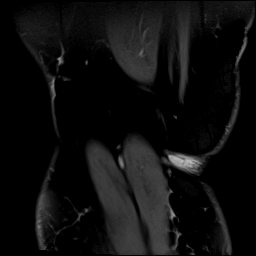
[im 32/32]
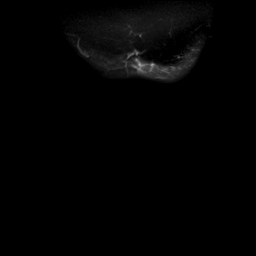

[Series 12: PD fat-sat · sagittal · right · 3.0mm · 0.47mm/px · 7 of 40 slices shown (2 of 2)]
[im 1/40]
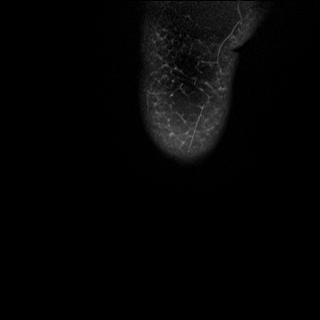
[im 7/40]
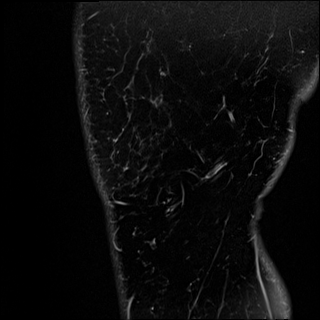
[im 14/40]
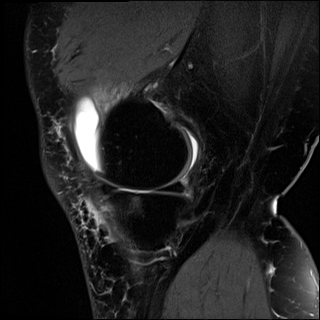
[im 20/40]
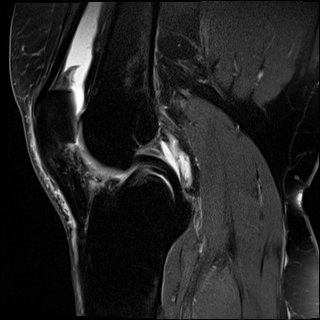
[im 27/40]
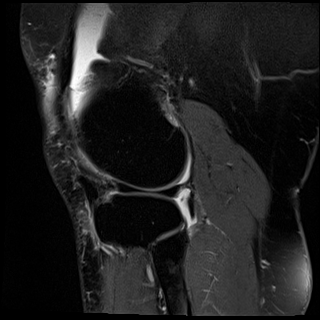
[im 33/40]
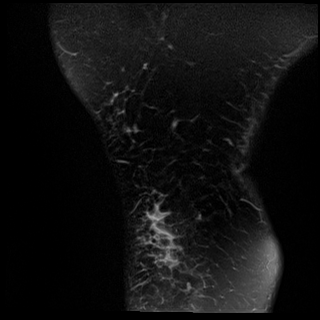
[im 40/40]
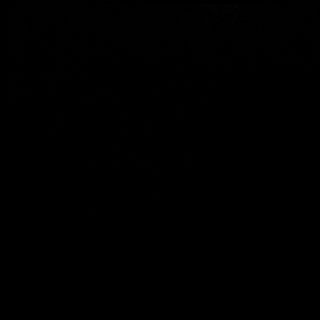

[Series 13: T2 fat-sat · sagittal · right · 3.0mm · 0.47mm/px · 7 of 40 slices shown (3 of 3)]
[im 1/40]
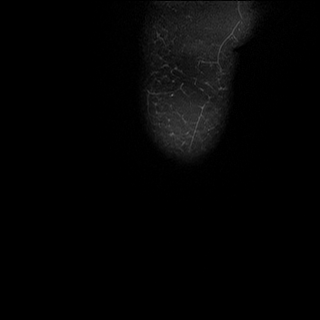
[im 7/40]
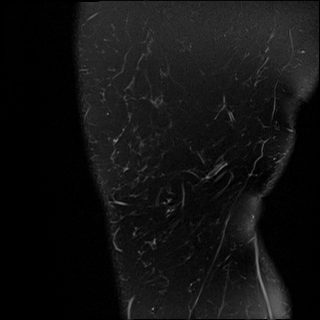
[im 14/40]
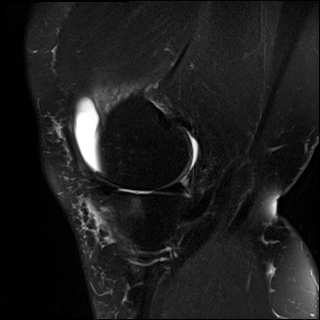
[im 20/40]
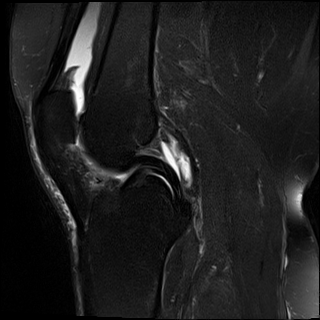
[im 27/40]
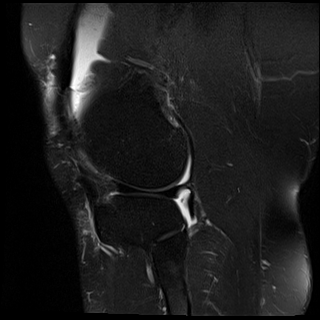
[im 33/40]
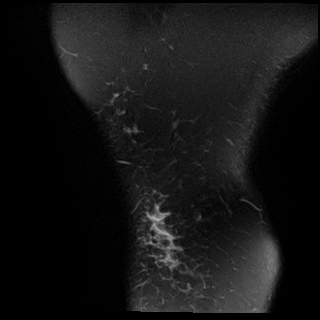
[im 40/40]
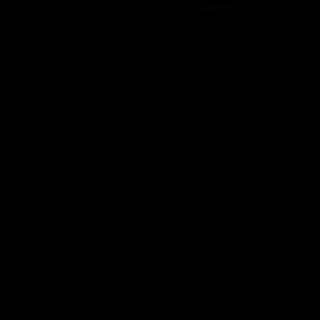

[Series 14: PD · coronal · right · 2.0mm · 0.47mm/px · 3 of 16 slices shown]
[im 1/16]
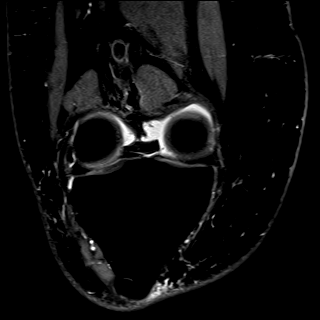
[im 8/16]
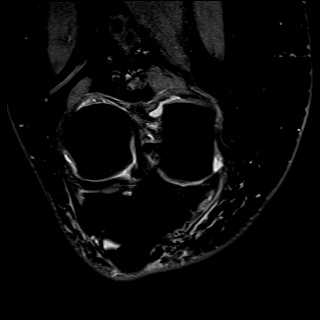
[im 16/16]
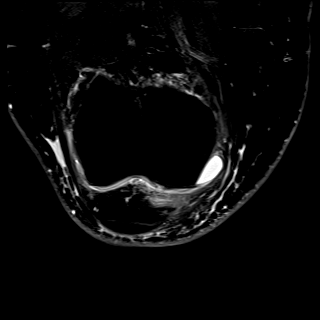

[40 of 40 positions shown; findings below may reference images not displayed]

FINDINGS: MENISCI

Medial: There is undersurface and free edge fraying near the
anterior root of the medial meniscus. There is no definitive
meniscus tear.

Lateral: No evidence of lateral meniscus tear.

LIGAMENTS

Cruciates: ACL and PCL are intact.

Collaterals: Medial collateral ligament is intact. Lateral
collateral ligament complex is intact.

CARTILAGE

Patellofemoral: Mild partial-thickness cartilage loss of the medial
patellar facet and medial trochlea.

Medial: Areas of intermediate to high-grade cartilage loss along the
weight-bearing surfaces, with associated subchondral marrow edema.

Lateral:  Mild chondrosis.

JOINT: Large joint effusion.

POPLITEAL FOSSA: No Baker's cyst.

EXTENSOR MECHANISM: Mild proximal and distal patellar tendinosis.
Intact quadriceps tendon.

BONES: Tricompartment osteophyte formation. No acute fracture or
dislocation. Subchondral marrow edema in the medial compartment
related to overlying cartilage loss.

Other: Mild prepatellar soft tissue swelling. No focal fluid
collection. Mild intramuscular edema in the proximal soleus.
IMPRESSION: Tricompartment osteoarthritis, worst in the medial compartment, with
areas of intermediate to high-grade cartilage loss along the
weight-bearing surfaces and associated subchondral marrow edema.

Undersurface and free edge fraying at the anterior root of the
medial meniscus. No definitive meniscus tear.

Large joint effusion.

Mild intramuscular edema in the proximal soleus compatible with
low-grade muscle strain.

Mild patellar tendinosis.

## 2021-08-09 ENCOUNTER — Ambulatory Visit: Payer: BC Managed Care – PPO | Admitting: Family

## 2021-08-13 ENCOUNTER — Encounter: Payer: Self-pay | Admitting: Family

## 2021-08-13 ENCOUNTER — Ambulatory Visit (INDEPENDENT_AMBULATORY_CARE_PROVIDER_SITE_OTHER): Payer: BC Managed Care – PPO | Admitting: Family

## 2021-08-13 ENCOUNTER — Ambulatory Visit (INDEPENDENT_AMBULATORY_CARE_PROVIDER_SITE_OTHER): Payer: BC Managed Care – PPO

## 2021-08-13 VITALS — BP 142/72 | HR 72 | Temp 97.6°F | Ht 64.0 in | Wt 228.4 lb

## 2021-08-13 DIAGNOSIS — I10 Essential (primary) hypertension: Secondary | ICD-10-CM

## 2021-08-13 DIAGNOSIS — M25571 Pain in right ankle and joints of right foot: Secondary | ICD-10-CM | POA: Diagnosis not present

## 2021-08-13 DIAGNOSIS — E538 Deficiency of other specified B group vitamins: Secondary | ICD-10-CM | POA: Diagnosis not present

## 2021-08-13 DIAGNOSIS — M7989 Other specified soft tissue disorders: Secondary | ICD-10-CM | POA: Diagnosis not present

## 2021-08-13 DIAGNOSIS — M79606 Pain in leg, unspecified: Secondary | ICD-10-CM | POA: Insufficient documentation

## 2021-08-13 LAB — SEDIMENTATION RATE: Sed Rate: 21 mm/hr — ABNORMAL HIGH (ref 0–20)

## 2021-08-13 LAB — B12 AND FOLATE PANEL
Folate: 15.1 ng/mL (ref >5.9)
Vitamin B-12: 290 pg/mL (ref 211–911)

## 2021-08-13 LAB — URIC ACID: Uric Acid, Serum: 4.3 mg/dL (ref 2.4–7.0)

## 2021-08-13 LAB — C-REACTIVE PROTEIN: CRP: 1.4 mg/dL (ref 0.5–20.0)

## 2021-08-13 IMAGING — DX DG ANKLE COMPLETE 3+V*R*
4 series · 4 of 4 positions shown · non-contrast
Comparison: None Available.

CLINICAL DATA: right ankle pain, swelling

EXAM:
RIGHT ANKLE - COMPLETE 3+ VIEW

[ankle ap]
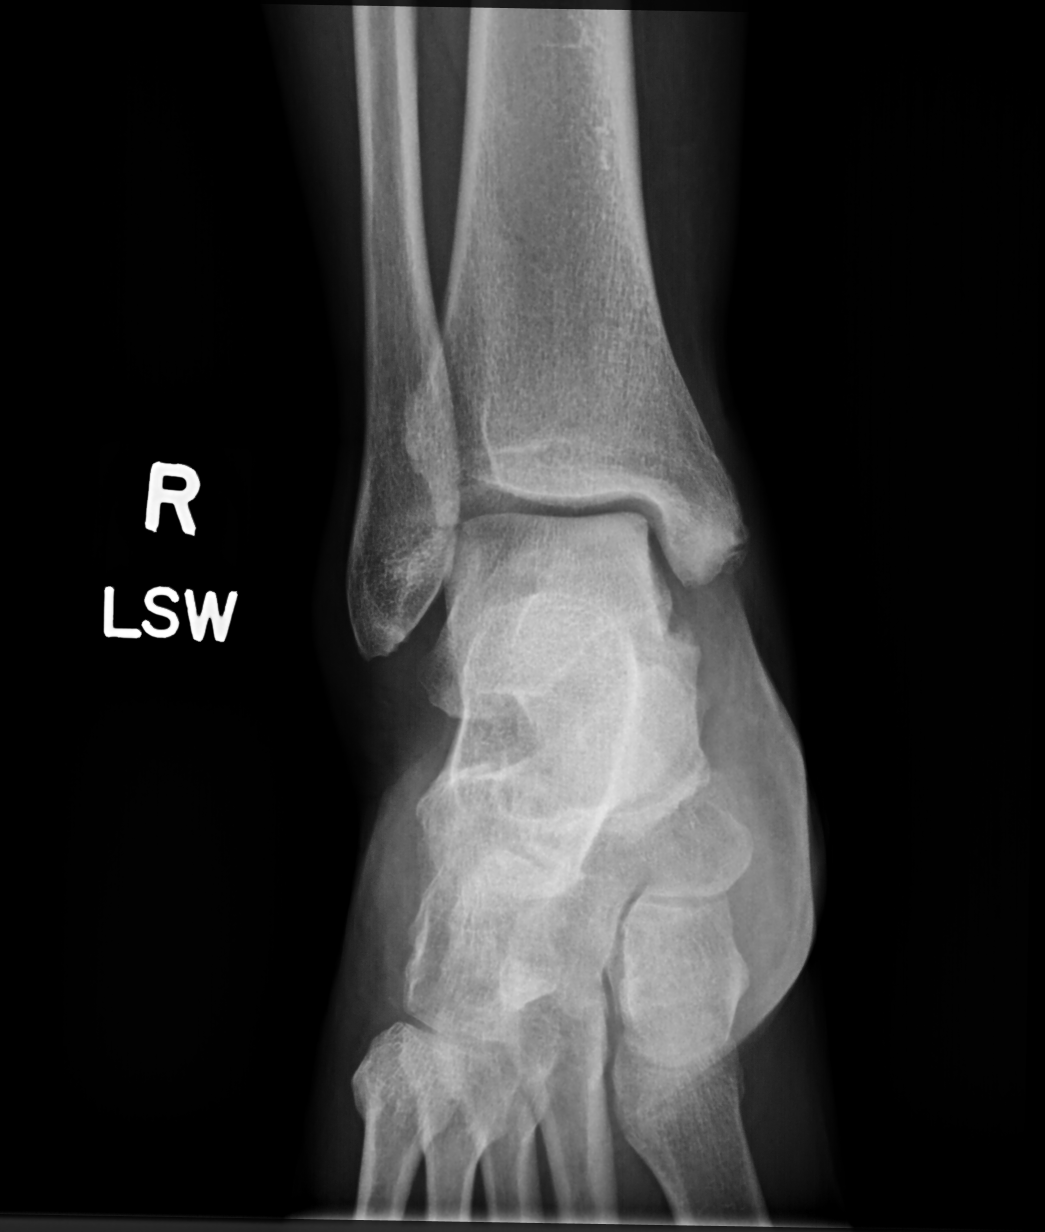

[ankle obl (oblique) (1 of 2)]
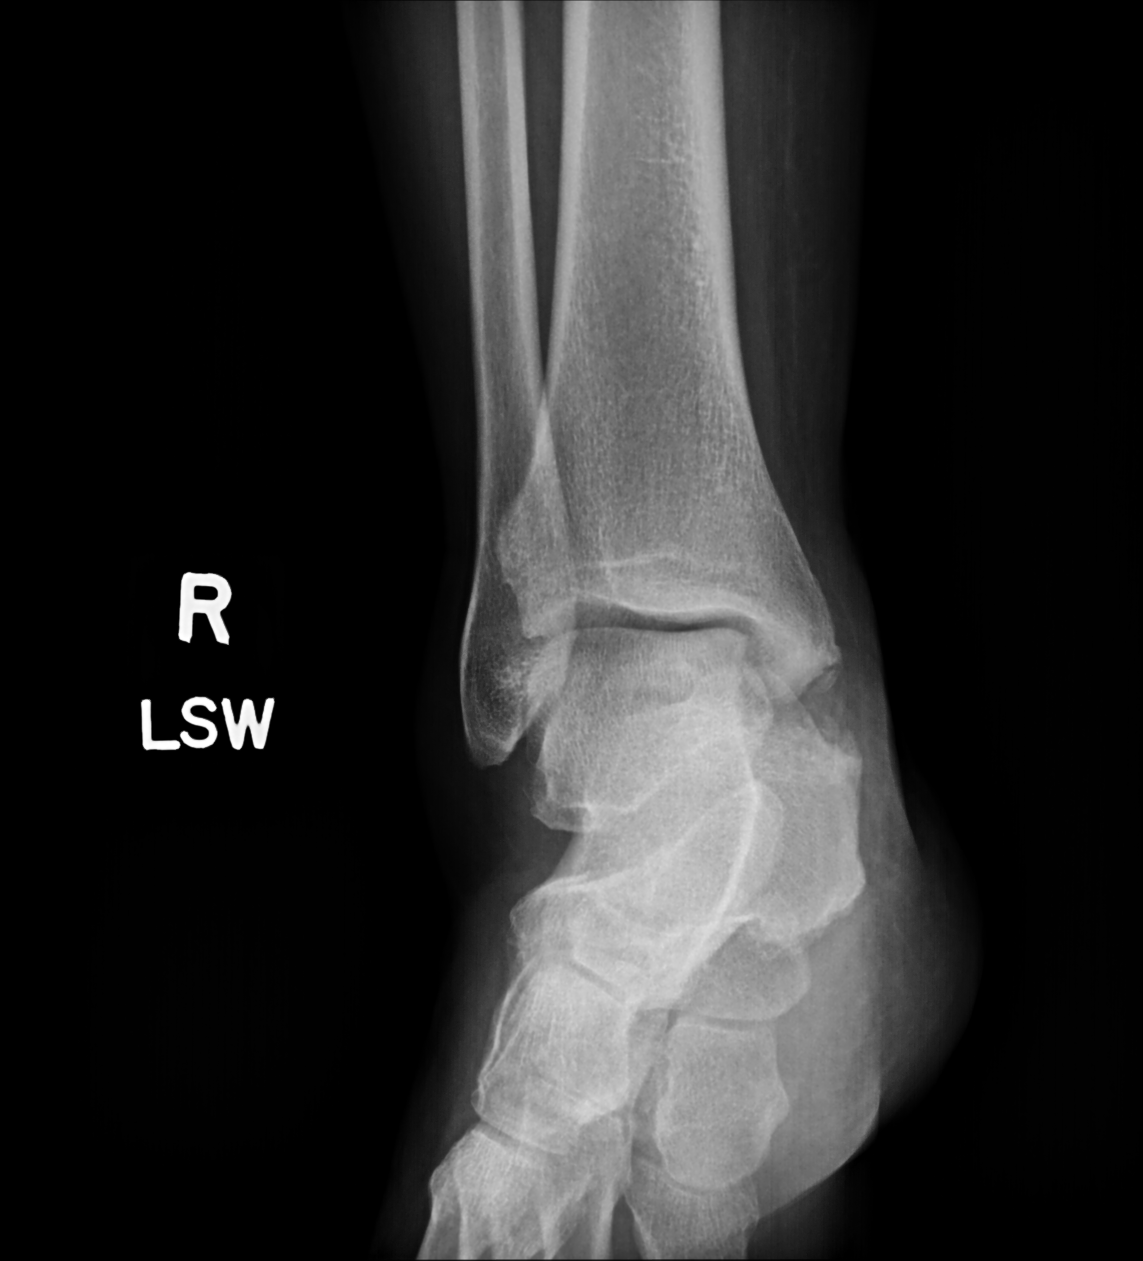

[ankle obl (oblique) (2 of 2)]
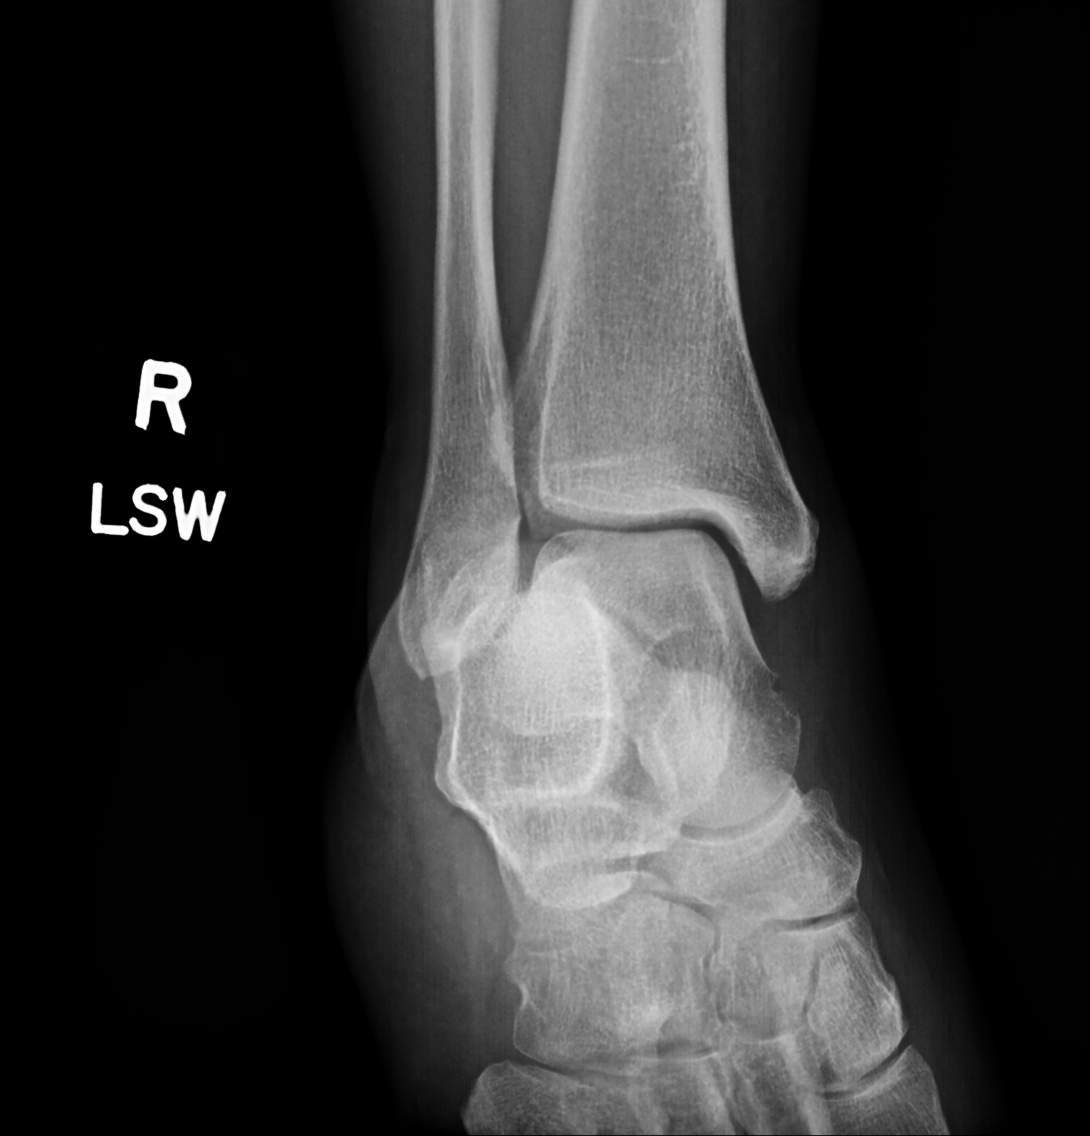

[ankle lat]
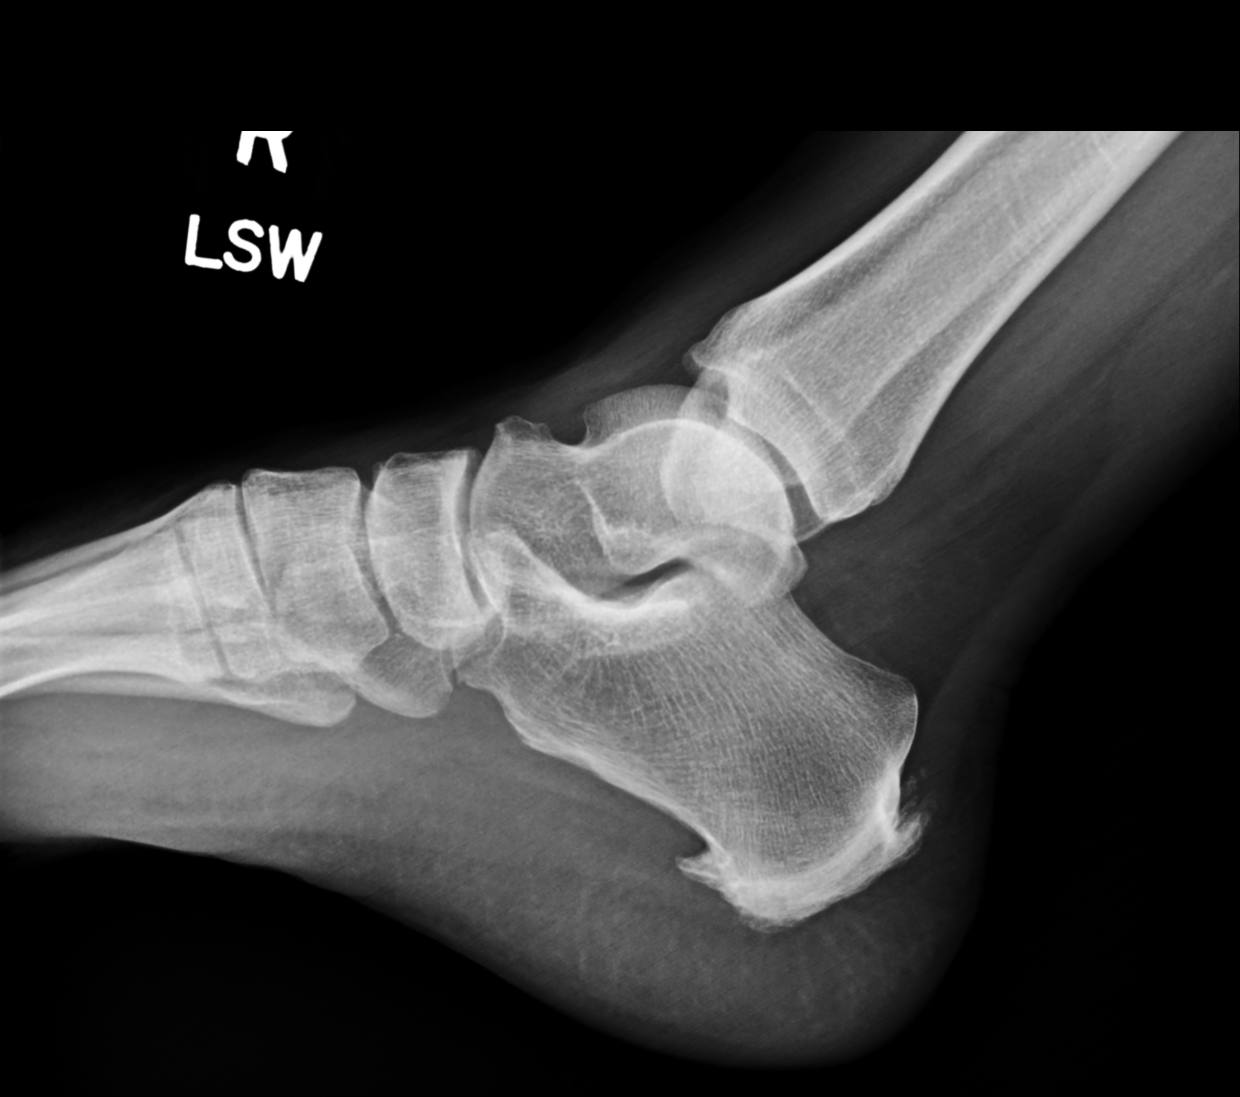

[4 of 4 positions shown; findings below may reference images not displayed]

FINDINGS: No acute fracture or dislocation. Enthesopathic changes of the
Achilles tendon and plantar calcaneus. Scattered midfoot
degenerative changes. No area of erosion. No unexpected radiopaque
foreign body. Soft tissues are unremarkable.
IMPRESSION: 1.  No acute fracture or dislocation.

## 2021-08-13 MED ORDER — HYDROCHLOROTHIAZIDE 12.5 MG PO CAPS: 12.5000 mg | ORAL_CAPSULE | Freq: Every day | ORAL | 1 refills | Status: DC

## 2021-08-13 MED ORDER — DULOXETINE HCL 30 MG PO CPEP: 30.0000 mg | ORAL_CAPSULE | Freq: Every day | ORAL | 1 refills | Status: DC

## 2021-08-13 NOTE — Progress Notes (Signed)
Subjective:    Patient ID: Nicole Escobar, female    DOB: May 28, 1975, 46 y.o.   MRN: [id number]  CC: Nicole Escobar is a 46 y.o. female who presents today for an acute visit.    HPI: Complains of bilateral ankle swelling  onset when knees started to bother her x 6 weeks.  Swelling is worse at end of day and after sitting for long periods. Improves with elevation.  She has occasional pain in calves when walking.  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  No wound on legs, sob, orthopnea. She has hair growth bilaterally.   Pain in right ankle which is more bothersome than bilateral knee pain.  No h/o trauma or injury of ankle.   Former smoker - quit 10 years ago MGM had RA.   Seen [name] 04/05/2021 for bilateral leg edema, R > L Seen by [facility] per patient 07/03/21 ( unable to see record) .Orthopedics gave her mobic which is helpful but '800mg'$  ibuprofen wasn't help 07/04/21 Bilateral ultrasound negative for DVT Started on Lasix 20 mg however she has not been using  BNP 44  A1c 5.7  HISTORY:  Past Medical History:  Diagnosis Date   Emphysema of lung ([place])    Frequent headaches    unknown onset   Past Surgical History:  Procedure Laterality Date   DILATATION & CURETTAGE/HYSTEROSCOPY WITH MYOSURE N/A 01/29/2021   Procedure: DILATATION & CURETTAGE/HYSTEROSCOPY WITH MYOSURE;  Surgeon: Will Bonnet, MD;  Location: [facility];  Service: Gynecology;  Laterality: N/A;   DILATION AND CURETTAGE OF UTERUS     TONSILECTOMY/ADENOIDECTOMY WITH MYRINGOTOMY Bilateral    TONSILLECTOMY     TUBAL LIGATION  2013   Family History  Problem Relation Age of Onset   Alcohol abuse Mother    Arthritis Mother    Cancer Mother        ovary   Hyperlipidemia Mother    Hypertension Mother    Diabetes Mother    Arthritis Maternal Grandmother    Hypertension Maternal Grandmother    Cancer Maternal Grandmother        colon   Rheum arthritis Maternal Grandfather 40     Allergies: Sulfa antibiotics No current outpatient medications on file prior to visit.   No current facility-administered medications on file prior to visit.    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  Respiratory:  Negative for cough and shortness of breath.   Cardiovascular:  Positive for leg swelling. Negative for chest pain and palpitations.  Gastrointestinal:  Negative for nausea and vomiting.  Musculoskeletal:  Positive for arthralgias (right knee).     Objective:    BP (!) 142/72 (BP Location: Left Arm, Patient Position: Sitting, Cuff Size: Large)   Pulse 72   Temp 97.6 F (36.4 C) (Oral)   Ht '5\' 4"'$  (1.626 m)   Wt 228 lb 6.4 oz (103.6 kg)  SpO2 99%   BMI 39.20 kg/m    Physical Exam Vitals reviewed.  Constitutional:      Appearance: She is well-developed.  Eyes:     Conjunctiva/sclera: Conjunctivae normal.  Cardiovascular:     Rate and Rhythm: Normal rate and regular rhythm.     Pulses: Normal pulses.     Heart sounds: Normal heart sounds.     Comments: +1 nonpitting bilateral ankle edema.No palpable cords or masses. No erythema or increased warmth. No asymmetry in calf size when compared bilaterally LE hair growth symmetric and present. No discoloration or varicosities noted. Diminished bilateral pedal pulses.  Pulmonary:     Effort: Pulmonary effort is normal.     Breath sounds: Normal breath sounds. No wheezing, rhonchi or rales.  Musculoskeletal:     Right ankle: No ecchymosis. No tenderness. Normal range of  motion.     Left ankle: No ecchymosis. No tenderness. Normal range of motion.  Skin:    General: Skin is warm and dry.  Neurological:     Mental Status: She is alert.  Psychiatric:        Speech: Speech normal.        Behavior: Behavior normal.        Thought Content: Thought content normal.       Assessment & Plan:   Problem List Items Addressed This Visit       Cardiovascular and Mediastinum   HTN (hypertension)    Elevated.  Advised not to start Lasix.  Start HCTZ 12.5 mg for blood pressure control and due to bilateral lower extremity edema.  Close follow-up       Relevant Orders   Basic metabolic panel     Other   B12 deficiency   Relevant Orders   Celiac Disease Ab Screen w/Rfx (Completed)   Homocysteine (Completed)   B12 and Folate Panel (Completed)   Methylmalonic acid, serum (Completed)   Right ankle pain - Primary    Uncontrolled. New. Pending x-ray.  +1 nonpitting edema present bilaterally.  Discussed elevation, icing, Ace wrap.  We will also start Cymbalta for further pain management particularly of right ankle and right knee.  Pending autoimmune evaluation due to maternal grandfather history.  Bilateral diminished pulses on exam.  Discussed ordering vascular ABI versus consult with vascular to further evaluate for peripheral artery disease.  She has a history of smoking.  Patient prefers to see vascular.  I placed that referral as well       Relevant Orders   DG Ankle Complete Right (Completed)   Ambulatory referral to Vascular Surgery   ANA (Completed)   C-reactive protein (Completed)   CYCLIC CITRUL PEPTIDE ANTIBODY, IGG/IGA (Completed)   Rheumatoid factor (Completed)   Sedimentation rate (Completed)   Uric acid (Completed)   Basic metabolic panel      I have discontinued Dewayne Hatch. Som's medroxyPROGESTERone and furosemide.   Meds ordered this encounter  Medications   DISCONTD: hydrochlorothiazide (MICROZIDE) 12.5 MG capsule    Sig: Take 1  capsule (12.5 mg total) by mouth daily.    Dispense:  90 capsule    Refill:  1    Order Specific Question:   Supervising Provider    Answer:   Crecencio Mc [[id number]]   DISCONTD: DULoxetine (CYMBALTA) 30 MG capsule    Sig: Take 1 capsule (30 mg total) by mouth daily.    Dispense:  90 capsule    Refill:  1    Order Specific Question:  Supervising Provider    Answer:   Crecencio Mc [[id number]]    Return precautions given.   Risks, benefits, and alternatives of the medications and treatment plan prescribed today were discussed, and patient expressed understanding.   Education regarding symptom management and diagnosis given to patient on AVS.  Continue to follow with Burnard Hawthorne, FNP for routine health maintenance.   Nicole Escobar and I agreed with plan.   Mable Paris, FNP

## 2021-08-13 NOTE — Patient Instructions (Signed)
You do not need to start Lasix.  I have started hydrochlorothiazide which is a blood pressure medication and also diuretic to help take the fluid from your legs.   Referral to vascular Let us know if you dont hear back within a week in regards to an appointment being scheduled.

## 2021-08-15 DIAGNOSIS — M13862 Other specified arthritis, left knee: Secondary | ICD-10-CM | POA: Diagnosis not present

## 2021-08-16 LAB — RHEUMATOID FACTOR: Rheumatoid fact SerPl-aCnc: <14 IU/mL (ref <14)

## 2021-08-16 LAB — CELIAC DISEASE AB SCREEN W/RFX
Antigliadin Abs, IgA: 9 units (ref 0–19)
IgA/Immunoglobulin A, Serum: 236 mg/dL (ref 87–352)
Transglutaminase IgA: <2 U/mL (ref 0–3)

## 2021-08-16 LAB — HOMOCYSTEINE: Homocysteine: 9.3 umol/L (ref <10.4)

## 2021-08-16 LAB — ANTI-NUCLEAR AB-TITER (ANA TITER)
ANA TITER: 1:40 {titer} — ABNORMAL HIGH
ANA Titer 1: 1:40 {titer} — ABNORMAL HIGH

## 2021-08-16 LAB — ANA: Anti Nuclear Antibody (ANA): POSITIVE — AB

## 2021-08-16 LAB — METHYLMALONIC ACID, SERUM: Methylmalonic Acid, Quant: 84 nmol/L — ABNORMAL LOW (ref 87–318)

## 2021-08-16 LAB — CYCLIC CITRUL PEPTIDE ANTIBODY, IGG/IGA: Cyclic Citrullin Peptide Ab: 2 units (ref 0–19)

## 2021-08-19 ENCOUNTER — Encounter: Payer: Self-pay | Admitting: Emergency Medicine

## 2021-08-19 ENCOUNTER — Emergency Department: Payer: BC Managed Care – PPO

## 2021-08-19 ENCOUNTER — Other Ambulatory Visit: Payer: Self-pay

## 2021-08-19 ENCOUNTER — Emergency Department
Admission: EM | Admit: 2021-08-19 | Discharge: 2021-08-19 | Disposition: A | Discharge status: Home / Self Care | Payer: BC Managed Care – PPO | Attending: Emergency Medicine | Admitting: Emergency Medicine

## 2021-08-19 DIAGNOSIS — F410 Panic disorder [episodic paroxysmal anxiety] without agoraphobia: Secondary | ICD-10-CM | POA: Diagnosis not present

## 2021-08-19 DIAGNOSIS — Z79899 Other long term (current) drug therapy: Secondary | ICD-10-CM | POA: Insufficient documentation

## 2021-08-19 DIAGNOSIS — R519 Headache, unspecified: Secondary | ICD-10-CM | POA: Diagnosis not present

## 2021-08-19 DIAGNOSIS — R55 Syncope and collapse: Secondary | ICD-10-CM | POA: Insufficient documentation

## 2021-08-19 DIAGNOSIS — R42 Dizziness and giddiness: Secondary | ICD-10-CM | POA: Diagnosis not present

## 2021-08-19 DIAGNOSIS — D72829 Elevated white blood cell count, unspecified: Secondary | ICD-10-CM | POA: Diagnosis not present

## 2021-08-19 DIAGNOSIS — I10 Essential (primary) hypertension: Secondary | ICD-10-CM | POA: Diagnosis not present

## 2021-08-19 LAB — BASIC METABOLIC PANEL
Anion gap: 9 (ref 5–15)
BUN: 22 mg/dL — ABNORMAL HIGH (ref 6–20)
CO2: 27 mmol/L (ref 22–32)
Calcium: 9 mg/dL (ref 8.9–10.3)
Chloride: 100 mmol/L (ref 98–111)
Creatinine, Ser: 0.68 mg/dL (ref 0.44–1.00)
GFR, Estimated: >60 mL/min (ref >60)
Glucose, Bld: 114 mg/dL — ABNORMAL HIGH (ref 70–99)
Potassium: 3.5 mmol/L (ref 3.5–5.1)
Sodium: 136 mmol/L (ref 135–145)

## 2021-08-19 LAB — CBC WITH DIFFERENTIAL/PLATELET
Abs Immature Granulocytes: 0.04 10*3/uL (ref 0.00–0.07)
Basophils Absolute: 0.1 10*3/uL (ref 0.0–0.1)
Basophils Relative: 1 %
Eosinophils Absolute: 0.1 10*3/uL (ref 0.0–0.5)
Eosinophils Relative: 1 %
HCT: 41.1 % (ref 36.0–46.0)
Hemoglobin: 13.3 g/dL (ref 12.0–15.0)
Immature Granulocytes: 0 %
Lymphocytes Relative: 27 %
Lymphs Abs: 3.3 10*3/uL (ref 0.7–4.0)
MCH: 27 pg (ref 26.0–34.0)
MCHC: 32.4 g/dL (ref 30.0–36.0)
MCV: 83.4 fL (ref 80.0–100.0)
Monocytes Absolute: 0.8 10*3/uL (ref 0.1–1.0)
Monocytes Relative: 6 %
Neutro Abs: 8.1 10*3/uL — ABNORMAL HIGH (ref 1.7–7.7)
Neutrophils Relative %: 65 %
Platelets: 341 10*3/uL (ref 150–400)
RBC: 4.93 MIL/uL (ref 3.87–5.11)
RDW: 13.2 % (ref 11.5–15.5)
WBC: 12.5 10*3/uL — ABNORMAL HIGH (ref 4.0–10.5)
nRBC: 0 % (ref 0.0–0.2)

## 2021-08-19 IMAGING — CT CT HEAD W/O CM
4 of 5 series · 15 of 47 positions shown, 17 images · non-contrast
Comparison: [DATE]

CLINICAL DATA: HTN, dizzy, headache.



[Series 2: head wo · axial · 0.43mm/px · z∈[-49,+51]mm · 5 of 31 slices shown, 7 images]
[im 6/31  brain]
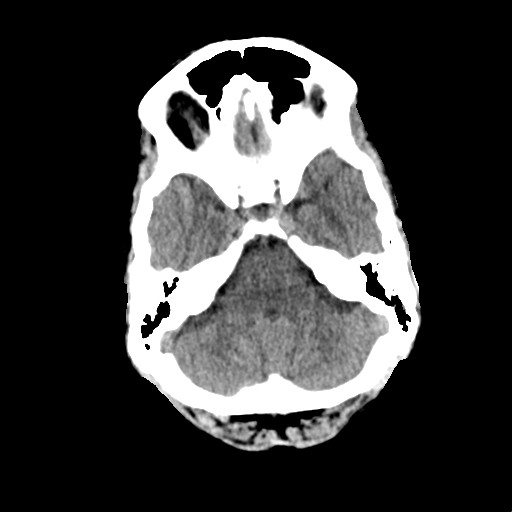
[im 6/31  bone]
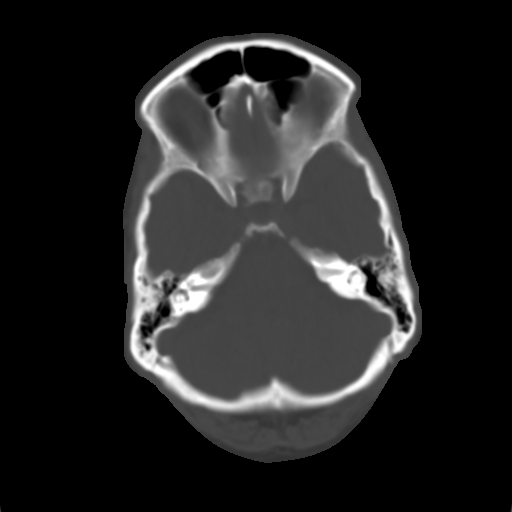
[im 11/31  brain]
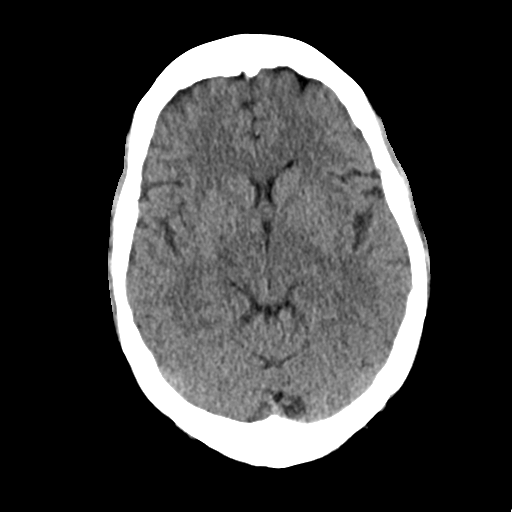
[im 16/31  brain]
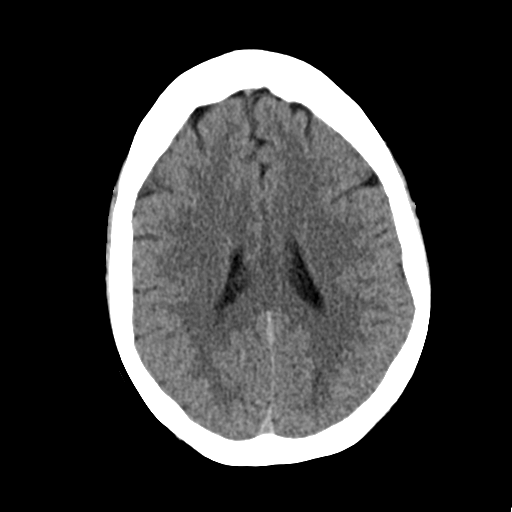
[im 21/31  brain]
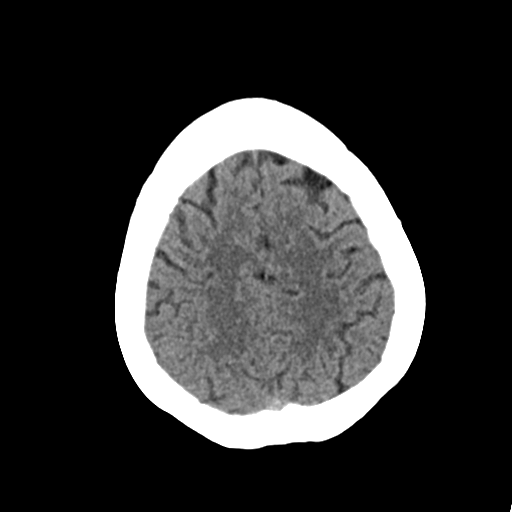
[im 26/31  brain]
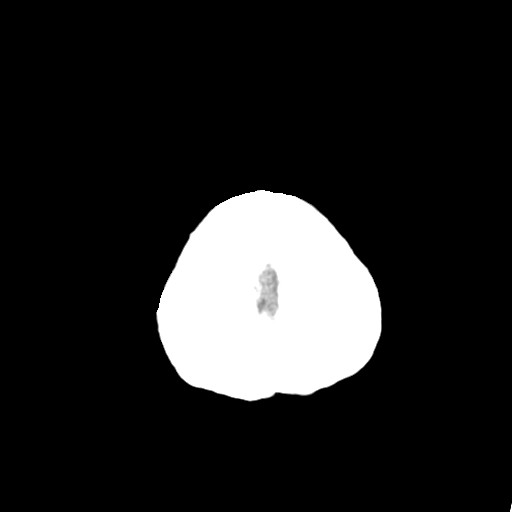
[im 26/31  bone]
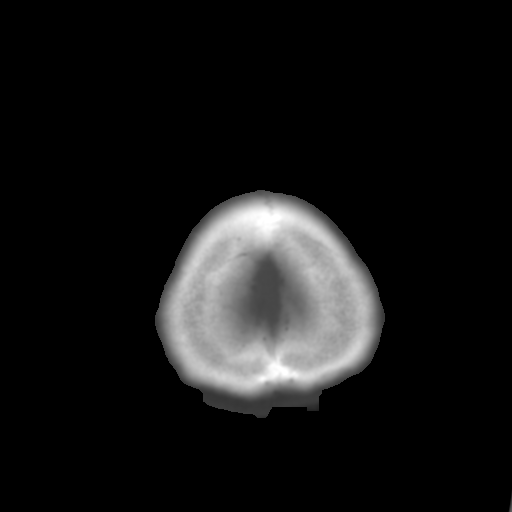

[Series 4: ax head wo · axial · 0.34mm/px · z∈[-40,+43]mm · 4 of 29 slices shown]
[im 6/29  brain]
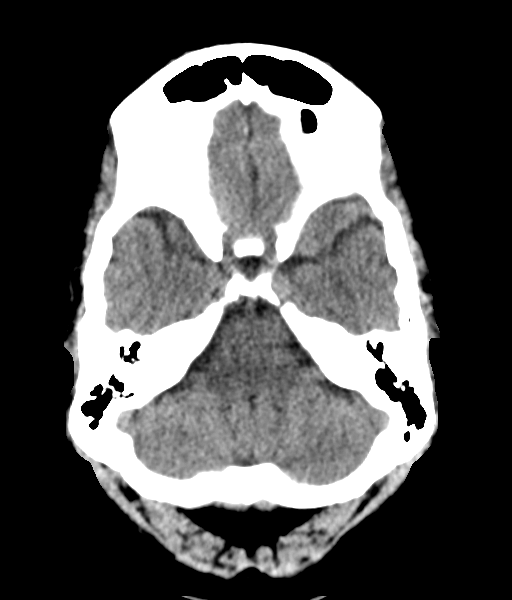
[im 12/29  brain]
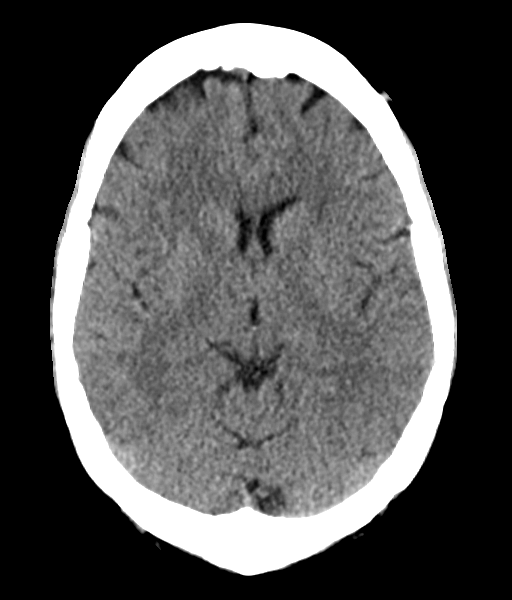
[im 17/29  brain]
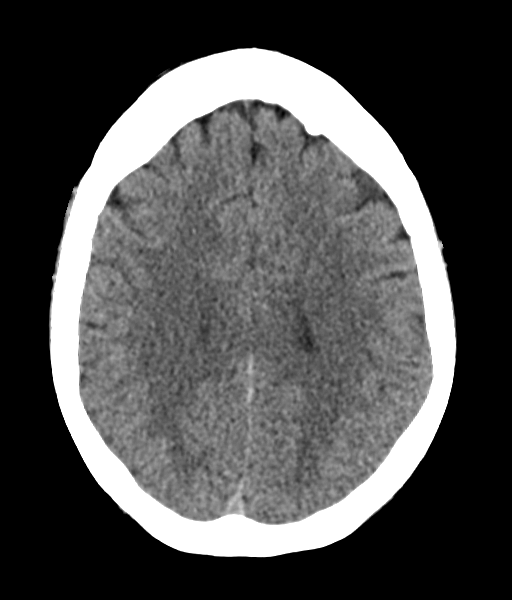
[im 23/29  brain]
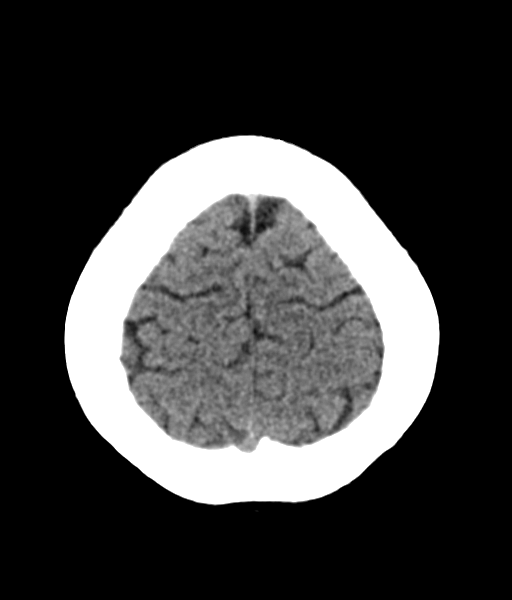

[Series 5: coronal soft tissue · coronal · 0.31mm/px · 3 of 67 slices shown]
[im 23/67  brain]
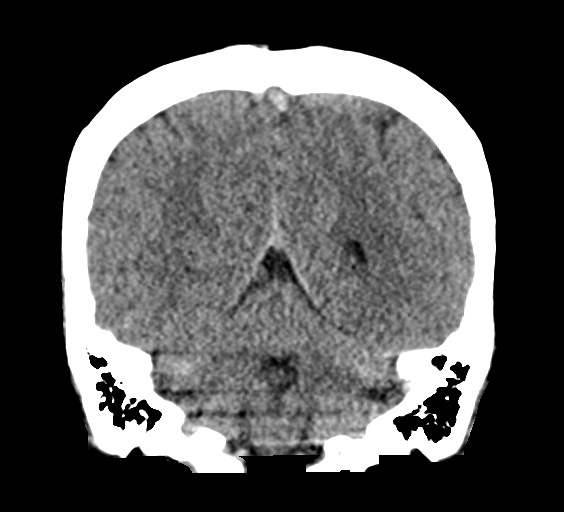
[im 30/67  brain]
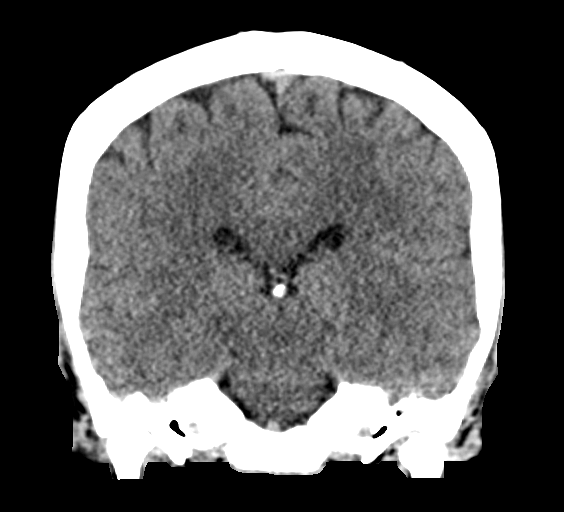
[im 37/67  brain]
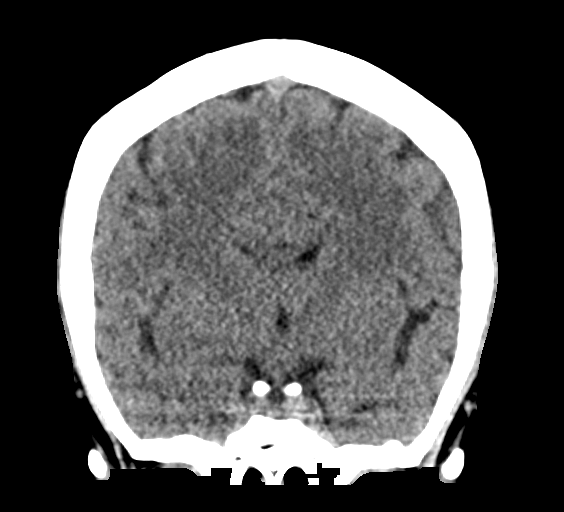

[Series 6: sagittal soft tissue · sagittal · 0.30mm/px · 3 of 51 slices shown]
[im 17/51  brain]
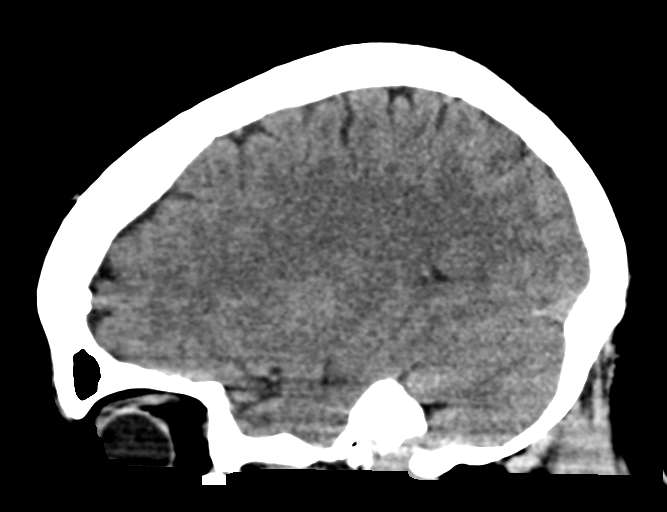
[im 26/51  brain]
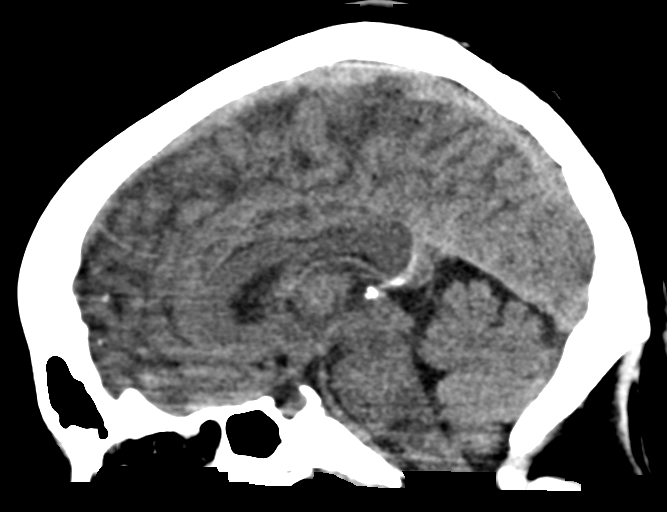
[im 34/51  brain]
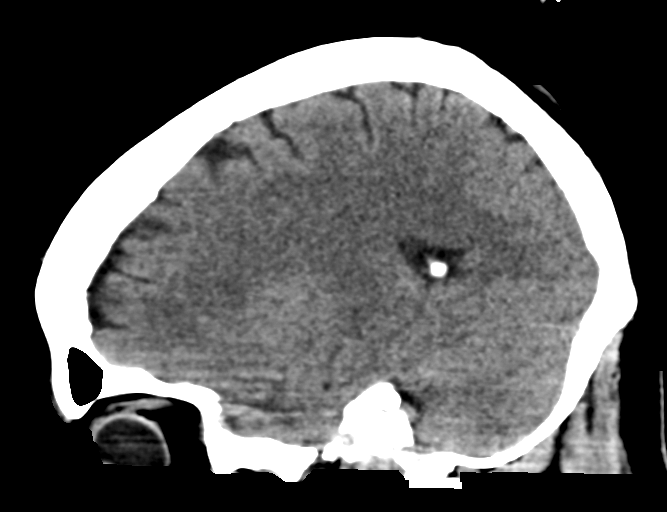

[15 of 47 positions shown; findings below may reference images not displayed]

FINDINGS: Brain: No acute intracranial abnormality. Specifically, no
hemorrhage, hydrocephalus, mass lesion, acute infarction, or
significant intracranial injury.

Vascular: No hyperdense vessel or unexpected calcification.

Skull: No acute calvarial abnormality.

Sinuses/Orbits: No acute findings

Other: None
IMPRESSION: Normal study.

## 2021-08-19 MED ORDER — LACTATED RINGERS IV BOLUS
1000.0000 mL | Freq: Once | INTRAVENOUS | Status: AC
  Administered 2021-08-19: 1000 mL via INTRAVENOUS

## 2021-08-19 MED ORDER — HYDROXYZINE HCL 25 MG PO TABS: 25.0000 mg | ORAL_TABLET | Freq: Three times a day (TID) | ORAL | 1 refills | Status: DC | PRN

## 2021-08-19 MED ORDER — HYDROXYZINE HCL 25 MG PO TABS
25.0000 mg | ORAL_TABLET | Freq: Once | ORAL | Status: AC
  Administered 2021-08-19: 25 mg via ORAL
  Filled 2021-08-19: qty 1

## 2021-08-19 MED ORDER — LORAZEPAM 2 MG/ML IJ SOLN
1.0000 mg | Freq: Once | INTRAMUSCULAR | Status: AC
  Administered 2021-08-19: 1 mg via INTRAVENOUS
  Filled 2021-08-19: qty 1

## 2021-08-19 NOTE — Discharge Instructions (Signed)
Please use the hydroxyzine as needed for any further anxiety or similar episodes.  If you have any further worsening symptoms despite this, episodes of passing out, please return to the ED

## 2021-08-19 NOTE — ED Provider Notes (Signed)
[facility] Provider Note    Event Date/Time   First MD Initiated Contact with Patient 08/19/21 [phone]     (approximate)   History   Near Syncope   HPI  ALEATHA TAITE is a 46 y.o. female who presents to the ED for evaluation of Near Syncope   I review PCP visit from 4/13. Obese patient with knee arthritis. Started on HCTZ for HTN and leg edema.   Patient presents to the ED via EMS from her workplace for evaluation of rapid onset flushed sensation, dizziness and feeling she has a lump in her throat.  She presents tearful and says she feels weird and dizzy.  Reports that it "must just be anxiety."  She denies any pain, headache, syncope or falls.  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  Physical Exam   Triage Vital Signs: ED Triage Vitals  Enc Vitals Group     BP      Pulse      Resp      Temp      Temp src      SpO2      Weight      Height      Head Circumference      Peak Flow      Pain Score      Pain Loc      Pain Edu?      Excl. in [place]?     Most recent vital signs: Vitals:   08/19/21 0437 08/19/21 0500  BP: (!) 130/111 (!) 155/101  Pulse: 64 61  Resp: 13 17  Temp:    SpO2: 96% 95%    General: Awake.  Obese, tearful, stands for EMS stretcher and ambulates to our stretcher without difficulty. CV:  Good peripheral perfusion. RRR Resp:  Normal effort. CTAB Abd:  No distention.  Soft and benign throughout MSK:  No deformity noted.  No signs of trauma Neuro:  No focal deficits appreciated. Cranial nerves II through XII intact 5/5 strength and sensation in all 4 extremities Other:     ED Results / Procedures / Treatments   Labs (all labs ordered are listed, but only abnormal results are displayed) Labs Reviewed  CBC WITH DIFFERENTIAL/PLATELET - Abnormal; Notable for the following components:      Result Value   WBC 12.5 (*)    Neutro Abs 8.1 (*)    All other  components within normal limits  BASIC METABOLIC PANEL - Abnormal; Notable for the following components:   Glucose, Bld 114 (*)    BUN 22 (*)    All other components within normal limits    EKG Sinus rhythm, rate of 73 bpm.  Normal axis and intervals.  No evidence of acute ischemia.  RADIOLOGY CT head interpreted by me without evidence of acute intracranial pathology  Official radiology report(s): CT HEAD WO CONTRAST (5MM)  Result Date: 08/19/2021 CLINICAL DATA:  HTN, dizzy, headache. EXAM: CT HEAD WITHOUT CONTRAST TECHNIQUE: Contiguous axial images were obtained from the base of the skull through the vertex without intravenous contrast. RADIATION DOSE REDUCTION: This exam was performed according to the departmental dose-optimization program which includes automated exposure control, adjustment of the mA and/or kV according to patient size and/or use of iterative reconstruction technique. COMPARISON:  10/14/2014 FINDINGS: Brain: No acute intracranial abnormality. Specifically, no hemorrhage, hydrocephalus, mass lesion, acute infarction, or significant intracranial injury. Vascular:  No hyperdense vessel or unexpected calcification. Skull: No acute calvarial abnormality. Sinuses/Orbits: No acute findings Other: None IMPRESSION: Normal study. Electronically Signed   By: Rolm Baptise M.D.   On: 08/19/2021 02:52    PROCEDURES and INTERVENTIONS:  .1-3 Lead EKG Interpretation Performed by: Vladimir Crofts, MD Authorized by: Vladimir Crofts, MD     Interpretation: normal     ECG rate:  64   ECG rate assessment: normal     Rhythm: sinus rhythm     Ectopy: none     Conduction: normal    Medications  LORazepam (ATIVAN) injection 1 mg (1 mg Intravenous Given 08/19/21 0239)  lactated ringers bolus 1,000 mL (0 mLs Intravenous Stopped 08/19/21 0522)  hydrOXYzine (ATARAX) tablet 25 mg (25 mg Oral Given 08/19/21 0402)     IMPRESSION / MDM / ASSESSMENT AND PLAN / ED COURSE  I reviewed the triage vital  signs and the nursing notes.  Differential diagnosis includes, but is not limited to, intracranial hemorrhage, anxiety, panic attack, dehydration, heat related illness  {Patient presents with symptoms of an acute illness or injury that is potentially life-threatening.  46 year old female presents to the ED after an episode of flushed, dizziness and anxiety likely a panic attack and suitable for outpatient management.  She was reassured examination and vital signs.  CT head without evidence of ICH or CVA.  Blood work with mild nonspecific leukocytosis.  Essentially normal metabolic panel.  EKG is nonischemic and she is maintained on the monitor without dysrhythmia.  Resolving symptoms after Ativan and hydroxyzine.  We will discharge with prescription for hydroxyzine and return precautions for the ED.  Clinical Course as of 08/19/21 5852  Mon Aug 19, 2021  0350 Reassessed.  [DS]  7782 Reassessed.  Feeling better.  We discussed the possibility of anxiety, panic attacks.  We discussed benign work-up otherwise.  We discussed home management and return precautions. [DS]    Clinical Course User Index [DS] Vladimir Crofts, MD     FINAL CLINICAL IMPRESSION(S) / ED DIAGNOSES   Final diagnoses:  Dizziness  Panic     Rx / DC Orders   ED Discharge Orders          Ordered    hydrOXYzine (ATARAX) 25 MG tablet  3 times daily PRN        08/19/21 0514             Note:  This document was prepared using Dragon voice recognition software and may include unintentional dictation errors.   Vladimir Crofts, MD 08/19/21 [id number]

## 2021-08-19 NOTE — ED Triage Notes (Signed)
Pt arrives via [facility], C/O near syncope.  Pt states that she began taking HCTZ for her BP 4 days ago.  This morning at work, she was on her feet and felt dizzy, hot, and flushed while working.  Denies falling,or LOC, was able to sit and rest.  Per EMS BP was 200/101, HR 89, O2 97 RA.  Pt denies pain at this time, endorses anxiety b/c "I felt like I could faint." Pt states that she feels her equilibrium is off.

## 2021-08-20 ENCOUNTER — Ambulatory Visit (INDEPENDENT_AMBULATORY_CARE_PROVIDER_SITE_OTHER): Payer: BC Managed Care – PPO | Admitting: Family

## 2021-08-20 DIAGNOSIS — I10 Essential (primary) hypertension: Secondary | ICD-10-CM

## 2021-08-20 DIAGNOSIS — F411 Generalized anxiety disorder: Secondary | ICD-10-CM

## 2021-08-20 DIAGNOSIS — R768 Other specified abnormal immunological findings in serum: Secondary | ICD-10-CM | POA: Diagnosis not present

## 2021-08-20 DIAGNOSIS — E538 Deficiency of other specified B group vitamins: Secondary | ICD-10-CM | POA: Diagnosis not present

## 2021-08-20 NOTE — Assessment & Plan Note (Signed)
Discussed labs today.  B12 less than 300.  Advised over-the-counter multivitamin which includes B12.

## 2021-08-20 NOTE — Assessment & Plan Note (Addendum)
Elevated.  Advised not to start Lasix.  Start HCTZ 12.5 mg for blood pressure control and due to bilateral lower extremity edema.  Close follow-up

## 2021-08-20 NOTE — Assessment & Plan Note (Signed)
Referral to rheumatology for further evaluation of positive ANA

## 2021-08-20 NOTE — Assessment & Plan Note (Signed)
Improved.   Patient opted not to start Cymbalta which I think is reasonable at this time as anxiety has improved.  She will continue Atarax 25 mg 3 times daily.

## 2021-08-20 NOTE — Assessment & Plan Note (Addendum)
Uncontrolled. New. Pending x-ray.  +1 nonpitting edema present bilaterally.  Discussed elevation, icing, Ace wrap.  We will also start Cymbalta for further pain management particularly of right ankle and right knee.  Pending autoimmune evaluation due to maternal grandfather history.  Bilateral diminished pulses on exam.  Discussed ordering vascular ABI versus consult with vascular to further evaluate for peripheral artery disease.  She has a history of smoking.  Patient prefers to see vascular.  I placed that referral as well

## 2021-08-20 NOTE — Progress Notes (Signed)
Subjective:    Patient ID: Nicole Escobar, female    DOB: 04-Apr-1975, 46 y.o.   MRN: [id number]  CC: Nicole Escobar is a 46 y.o. female who presents today for follow up.   HPI: Anxiety has improved. She feels better taking atarax '25mg'$  which she has been taking twice daily.  She opted not to start cymbalta.   No dysuria, fever, cough.    Seen 08/15/21 by orthopedic, Dr Sharlet Salina. Unable to see note in EMR however pt patient , she reports given knee injection. Knee pain is much improved. Surgery would be next step  Started on HCTZ 12.'5mg'$  for blood pressure control last week which she stopped yesterday.  Dizziness has since resolved. No syncope.   Emergency room yesterday for near syncopal episode.   CT head obtained normal study. Given ativan while in ED and prescribed atarax '25mg'$  TID  Positive ANA, sed rate elevated 21 ( prior 19)  Normal uric acid Negative celiac screen Low b12 WBC 12.5  Right ankle x-ray shows no acute fracture or dislocation; Enthesopathy noted    HISTORY:  Past Medical History:  Diagnosis Date   Emphysema of lung ([place])    Frequent headaches    unknown onset   Past Surgical History:  Procedure Laterality Date   DILATATION & CURETTAGE/HYSTEROSCOPY WITH MYOSURE N/A 01/29/2021   Procedure: DILATATION & CURETTAGE/HYSTEROSCOPY WITH MYOSURE;  Surgeon: Will Bonnet, MD;  Location: [facility] ORS;  Service: Gynecology;  Laterality: N/A;   DILATION AND CURETTAGE OF UTERUS     TONSILECTOMY/ADENOIDECTOMY WITH MYRINGOTOMY Bilateral    TONSILLECTOMY     TUBAL LIGATION  2013   Family History  Problem Relation Age of Onset   Alcohol abuse Mother    Arthritis Mother    Cancer Mother        ovary   Hyperlipidemia Mother    Hypertension Mother    Diabetes Mother    Arthritis Maternal Grandmother    Hypertension Maternal Grandmother    Cancer Maternal Grandmother        colon   Rheum arthritis Maternal Grandfather 40    Allergies: Sulfa antibiotics Current  Outpatient Medications on File Prior to Visit  Medication Sig Dispense Refill   hydrOXYzine (ATARAX) 25 MG tablet Take 1 tablet (25 mg total) by mouth 3 (three) times daily as needed for anxiety. 30 tablet 1   No current facility-administered medications on file prior to visit.    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  Respiratory:  Negative for cough and shortness of breath.   Cardiovascular:  Negative for chest pain, palpitations and leg swelling (resolved).  Gastrointestinal:  Negative for nausea and vomiting.  Musculoskeletal:  Negative for arthralgias (right knee pain resolved) and myalgias.     Objective:    BP 132/78 (BP Location: Left Arm, Patient Position: Sitting, Cuff Size: Large)   Pulse 83   Temp (!) 97.5 F (36.4 C) (Oral)  Ht '5\' 4"'$  (1.626 m)   Wt 222 lb (100.7 kg)   SpO2 98%   BMI 38.11 kg/m  BP Readings from Last 3 Encounters:  08/20/21 132/78  08/19/21 (!) 155/101  08/13/21 (!) 142/72   Wt Readings from Last 3 Encounters:  08/20/21 222 lb (100.7 kg)  08/19/21 225 lb (102.1 kg)  08/13/21 228 lb 6.4 oz (103.6 kg)    Physical Exam Vitals reviewed.  Constitutional:      Appearance: She is well-developed.  Eyes:     Conjunctiva/sclera: Conjunctivae normal.  Cardiovascular:     Rate and Rhythm: Normal rate and regular rhythm.     Pulses: Normal pulses.     Heart sounds: Normal heart sounds.  Pulmonary:     Effort: Pulmonary effort is normal.     Breath sounds: Normal breath sounds. No wheezing, rhonchi or rales.   Musculoskeletal:     Right lower leg: No edema.     Left lower leg: No edema.  Skin:    General: Skin is warm and dry.  Neurological:     Mental Status: She is alert.  Psychiatric:        Speech: Speech normal.        Behavior: Behavior normal.        Thought Content: Thought content normal.       Assessment & Plan:   Problem List Items Addressed This Visit       Cardiovascular and Mediastinum   HTN (hypertension)    Improved.  Leg swelling completely resolved.  Advised to continue to monitor blood pressure periodically to ensure we are reaching goal of less than 130/80.  We will suspend hydrochlorothiazide at this time due to previous dizziness ( resolved)          Other   B12 deficiency    Discussed labs today.  B12 less than 300.  Advised over-the-counter multivitamin which includes B12.       GAD (generalized anxiety disorder)    Improved.   Patient opted not to start Cymbalta which I think is reasonable at this time as anxiety has improved.  She will continue Atarax 25 mg 3 times daily.       Positive ANA (antinuclear antibody)    Referral to rheumatology for further evaluation of positive ANA       Relevant Orders   Ambulatory referral to Rheumatology     I have discontinued Dewayne Hatch. Mowatt's meloxicam, fluticasone, hydrochlorothiazide, and DULoxetine. I am also having her maintain her hydrOXYzine.   No orders of the defined types were placed in this encounter.   Return precautions given.   Risks, benefits, and alternatives of the medications and treatment plan prescribed today were discussed, and patient expressed understanding.   Education regarding symptom management and diagnosis given to patient on AVS.  Continue to follow with Burnard Hawthorne, FNP for routine health maintenance.   Nicole Escobar and I agreed with plan.   Mable Paris, FNP

## 2021-08-20 NOTE — Assessment & Plan Note (Signed)
Improved.  Leg swelling completely resolved.  Advised to continue to monitor blood pressure periodically to ensure we are reaching goal of less than 130/80.  We will suspend hydrochlorothiazide at this time due to previous dizziness ( resolved)

## 2021-08-20 NOTE — Patient Instructions (Addendum)
You may stop HCTZ  as discussed  It is imperative that you are seen AT least twice per year for labs and monitoring. Monitor blood pressure at home and me 5-6 reading on separate days. Goal is less than 120/80, based on newest guidelines, however we certainly want to be less than 130/80;  if persistently higher, please make sooner follow up appointment so we can recheck you blood pressure and manage/ adjust medications.

## 2021-08-21 ENCOUNTER — Telehealth: Payer: Self-pay | Admitting: Family

## 2021-08-21 NOTE — Telephone Encounter (Signed)
Call patient [name] ordered abdominal ultrasound when patient was seen for leg swelling.  Does she still want [place] to schedule this?  If not, please let [name] know that we can cancel

## 2021-08-22 NOTE — Telephone Encounter (Signed)
Spoke to patient and she does not want to order this ultrasound. Will let Rasheeda know so it can be canceled.

## 2021-10-18 ENCOUNTER — Other Ambulatory Visit (INDEPENDENT_AMBULATORY_CARE_PROVIDER_SITE_OTHER): Payer: Self-pay | Admitting: Nurse Practitioner

## 2021-10-18 DIAGNOSIS — R0989 Other specified symptoms and signs involving the circulatory and respiratory systems: Secondary | ICD-10-CM

## 2021-10-18 DIAGNOSIS — M25571 Pain in right ankle and joints of right foot: Secondary | ICD-10-CM

## 2021-10-21 ENCOUNTER — Encounter (INDEPENDENT_AMBULATORY_CARE_PROVIDER_SITE_OTHER): Payer: BC Managed Care – PPO

## 2021-10-21 ENCOUNTER — Encounter (INDEPENDENT_AMBULATORY_CARE_PROVIDER_SITE_OTHER): Payer: BC Managed Care – PPO | Admitting: Nurse Practitioner

## 2021-11-19 ENCOUNTER — Encounter: Payer: Self-pay | Admitting: Family

## 2021-11-19 ENCOUNTER — Ambulatory Visit (INDEPENDENT_AMBULATORY_CARE_PROVIDER_SITE_OTHER): Payer: BC Managed Care – PPO | Admitting: Family

## 2021-11-19 VITALS — BP 132/86 | HR 71 | Temp 97.5°F | Ht 64.0 in | Wt 226.0 lb

## 2021-11-19 DIAGNOSIS — M25511 Pain in right shoulder: Secondary | ICD-10-CM | POA: Diagnosis not present

## 2021-11-19 MED ORDER — CYCLOBENZAPRINE HCL 5 MG PO TABS: 5.0000 mg | ORAL_TABLET | Freq: Three times a day (TID) | ORAL | 2 refills | Status: DC | PRN

## 2021-11-19 MED ORDER — MELOXICAM 7.5 MG PO TABS: 7.5000 mg | ORAL_TABLET | Freq: Every day | ORAL | 2 refills | Status: DC | PRN

## 2021-11-19 NOTE — Patient Instructions (Addendum)
A couple of points in regards to meloxicam ( Mobic) -  This medication is not intended for daily , long term use. It is a potent anti inflammatory ( NSAID), and my intention is for you take as needed for moderate to severe pain. If you find yourself using daily, please let me know.   Please takes Mobic ( meloxicam) with FOOD since it is an anti-inflammatory as it can cause a GI bleed or ulcer. If you have a history of GI bleed or ulcer, please do NOT take.  Do no take over the counter aleve, motrin, advil, goody's powder for pain as they are also NSAIDs, and they are  in the same class as Mobic  Lastly, we will need to monitor kidney function while on Mobic, and if we were to see any decline in kidney function in the future, we would have to discontinue this medication.    Let me know how you are doing

## 2021-11-19 NOTE — Progress Notes (Signed)
Subjective:    Patient ID: Nicole Escobar, female    DOB: [birth year], 46 y.o.   MRN: [id number]  CC: [name] is a 46 y.o. female who presents today for an acute visit.    HPI: Right lateral shoulder pain x4 days, with some improvement with rest. She is able to sleep on right shoulder after rest.  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  Constant pain has improved but episodic and describes as 'soreness' She has posterior neck pain for years.  No nunness or weakness in right arm.  Some relief ibuprofen '800mg'$  with temparilly relief.   NO recent prednisone use, helpful in the past.  No h/o GIB.  She also had ear pain last week. No fever, sinus congestion, purulent discharge from ear.   She has been swimming.      Appointment with rheumatology next month HISTORY:  Past Medical History:  Diagnosis Date   Emphysema of lung ([place])    Frequent headaches    unknown onset   Past Surgical History:  Procedure Laterality Date   DILATATION & CURETTAGE/HYSTEROSCOPY WITH MYOSURE N/A 01/29/2021   Procedure: DILATATION & CURETTAGE/HYSTEROSCOPY WITH MYOSURE;  Surgeon: Will Bonnet, MD;  Location: [facility] ORS;  Service: Gynecology;  Laterality: N/A;   DILATION AND CURETTAGE OF UTERUS     TONSILECTOMY/ADENOIDECTOMY WITH MYRINGOTOMY Bilateral    TONSILLECTOMY     TUBAL LIGATION  2013   Family History  Problem Relation Age of Onset   Alcohol abuse Mother    Arthritis Mother    Cancer Mother        ovary   Hyperlipidemia Mother    Hypertension Mother    Diabetes Mother    Arthritis Maternal Grandmother    Hypertension Maternal Grandmother    Cancer Maternal Grandmother        colon   Rheum arthritis Maternal Grandfather 40    Allergies: Sulfa antibiotics No current outpatient medications on file prior to visit.   No current facility-administered medications on file  prior to visit.    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  Respiratory:  Negative for cough.   Cardiovascular:  Negative for chest pain and palpitations.  Gastrointestinal:  Negative for nausea and vomiting.  Musculoskeletal:  Positive for arthralgias.      Objective:    BP 132/86 (BP Location: Left Arm, Patient Position: Sitting, Cuff Size: Large)   Pulse 71   Temp (!) 97.5 F (36.4 C) (Oral)   Ht '5\' 4"'$  (1.626 m)   Wt 226 lb (102.5 kg)   SpO2 98%   BMI 38.79 kg/m    Physical Exam Vitals reviewed.  Constitutional:      Appearance: She is well-developed.  Eyes:     Conjunctiva/sclera: Conjunctivae normal.  Cardiovascular:  Rate and Rhythm: Normal rate and regular rhythm.     Pulses: Normal pulses.     Heart sounds: Normal heart sounds.  Pulmonary:     Effort: Pulmonary effort is normal.     Breath sounds: Normal breath sounds. No wheezing, rhonchi or rales.  Musculoskeletal:     Comments: Right Shoulder:   No asymmetry of shoulders when comparing right and left.No pain with palpation over glenohumeral joint lines, Union joint, AC joint, or bicipital groove. No pain with internal and external rotation. No pain with resisted lateral extension .   Negative active painful arc sign. Negative passive arc ( Neer's). Negative drop arm.  No pain, swelling, or ecchymosis noted over long head of biceps.  No bicep pain with resisted flexion and extension.  Negative speeds test. Negative Popeye's sign.   Strength and sensation  normal BUE's.   Skin:    General: Skin is warm and dry.  Neurological:     Mental Status: She is alert.  Psychiatric:        Speech: Speech normal.        Behavior: Behavior normal.        Thought Content: Thought content normal.        Assessment & Plan:   Problem List Items Addressed This Visit       Other   Right shoulder pain - Primary    Based on presentation and injury related to mopping concern for bicep tendinitis. Advised conservative therapy with use of meloxicam.  Also provided her with Flexeril to use for chronic posterior neck pain.  She politely declines x-ray images at this time.  She will let me know if she is doing.       Relevant Medications   meloxicam (MOBIC) 7.5 MG tablet   cyclobenzaprine (FLEXERIL) 5 MG tablet      I have discontinued Estill Bamberg G. Prien's hydrOXYzine. I am also having her start on meloxicam and cyclobenzaprine.   Meds ordered this encounter  Medications   meloxicam (MOBIC) 7.5 MG tablet    Sig: Take 1 tablet (7.5 mg total) by mouth daily as needed for pain.    Dispense:  30 tablet    Refill:  2    Order Specific Question:   Supervising Provider    Answer:   Deborra Medina L [[id number]]   cyclobenzaprine (FLEXERIL) 5 MG tablet    Sig: Take 1 tablet (5 mg total) by mouth 3 (three) times daily as needed for muscle spasms.    Dispense:  30 tablet    Refill:  2    Order Specific Question:   Supervising Provider    Answer:   Crecencio Mc [[id number]]    Return precautions given.   Risks, benefits, and alternatives of the medications and treatment plan prescribed today were discussed, and patient expressed understanding.   Education regarding symptom management and diagnosis given to patient on AVS.  Continue to follow with Burnard Hawthorne, FNP for routine health maintenance.   Nicole Escobar and I agreed with plan.   Mable Paris, FNP

## 2021-11-20 ENCOUNTER — Telehealth: Payer: Self-pay | Admitting: Family

## 2021-11-20 NOTE — Telephone Encounter (Signed)
The patient did not receive her restrictions on MYChart and she is to go back to work today.

## 2021-11-20 NOTE — Telephone Encounter (Signed)
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

## 2021-11-21 NOTE — Telephone Encounter (Signed)
Called patient and  and informed her that she can pick up her work note up front anytime between 1-5pm.

## 2021-11-27 NOTE — Assessment & Plan Note (Signed)
Based on presentation and injury related to mopping concern for bicep tendinitis. Advised conservative therapy with use of meloxicam.  Also provided her with Flexeril to use for chronic posterior neck pain.  She politely declines x-ray images at this time.  She will let me know if she is doing.

## 2021-12-01 NOTE — Progress Notes (Deleted)
Office Visit Note  Patient: Nicole Escobar             Date of Birth: 06/03/1975           MRN: [id number]             PCP: Burnard Hawthorne, FNP Referring: Burnard Hawthorne, FNP Visit Date: 12/02/2021 Occupation: _0 @  Subjective:  No chief complaint on file.   History of Present Illness: Nicole Escobar is a 46 y.o. female here for evaluation of positive ANA checked in evaluation of knee and ankle pain and swelling. She had MRI of the knees showing tricompartment osteoarthritis worst in medial compartment and large effusions.  Previously right knee steroid injection with Dr. Sharlet Salina with symptom benefit.***   Labs reviewed 07/2021 ANA 1:40 homogenous 1:40 speckled ESR 21 CRP 1.4 RF neg CCP neg Celiac panel neg Uric acid 4.3  Activities of Daily Living:  Patient reports morning stiffness for *** {minute/hour:19697}.   Patient {ACTIONS;DENIES/REPORTS:21021675::"Denies"} nocturnal pain.  Difficulty dressing/grooming: {ACTIONS;DENIES/REPORTS:21021675::"Denies"} Difficulty climbing stairs: {ACTIONS;DENIES/REPORTS:21021675::"Denies"} Difficulty getting out of chair: {ACTIONS;DENIES/REPORTS:21021675::"Denies"} Difficulty using hands for taps, buttons, cutlery, and/or writing: {ACTIONS;DENIES/REPORTS:21021675::"Denies"}  No Rheumatology ROS completed.   PMFS History:  Patient Active Problem List   Diagnosis Date Noted   Right shoulder pain 11/19/2021   Positive ANA (antinuclear antibody) 08/20/2021   Right ankle pain 08/13/2021   HTN (hypertension) 08/13/2021   Arthritis of knee 07/04/2021   PND (post-nasal drip) 06/18/2021   History of cervical polypectomy 04/25/2021   History of cervical dysplasia 04/25/2021   Wheezing 03/11/2021   Acute cough 03/11/2021   Viral upper respiratory tract infection 03/11/2021   Menorrhagia with irregular cycle 01/29/2021   Uterine mass 01/29/2021   Bilateral knee pain 08/28/2020   Arthralgia 08/28/2020   External hemorrhoid,  thrombosed 07/26/2019   Asymptomatic microscopic hematuria 05/11/2019   Varicose veins of both lower extremities with pain 05/11/2019   Obesity (BMI 35.0-39.9 without comorbidity) 05/11/2019   B12 deficiency 04/27/2019   Polyarthralgia 04/27/2019   GAD (generalized anxiety disorder) 02/08/2018   Lower abdominal pain 02/08/2018   Boil 02/08/2018   Elevated blood pressure reading 02/08/2018   Chest pain 02/08/2018   Left ear pain 11/18/2016   History of cocaine abuse ([place]) 04/03/2016   Influenza-like illness 05/14/2015   Benign paroxysmal positional vertigo 10/17/2014   Hand joint pain 05/16/2014   Bilateral carpal tunnel syndrome 05/16/2014   Stress headaches 03/16/2014    Past Medical History:  Diagnosis Date   Emphysema of lung ([place])    Frequent headaches    unknown onset    Family History  Problem Relation Age of Onset   Alcohol abuse Mother    Arthritis Mother    Cancer Mother        ovary   Hyperlipidemia Mother    Hypertension Mother    Diabetes Mother    Arthritis Maternal Grandmother    Hypertension Maternal Grandmother    Cancer Maternal Grandmother        colon   Rheum arthritis Maternal Grandfather 40   Past Surgical History:  Procedure Laterality Date   DILATATION & CURETTAGE/HYSTEROSCOPY WITH MYOSURE N/A 01/29/2021   Procedure: DILATATION & CURETTAGE/HYSTEROSCOPY WITH MYOSURE;  Surgeon: Will Bonnet, MD;  Location: [facility] ORS;  Service: Gynecology;  Laterality: N/A;   DILATION AND CURETTAGE OF UTERUS     TONSILECTOMY/ADENOIDECTOMY WITH MYRINGOTOMY Bilateral    TONSILLECTOMY     TUBAL LIGATION  2013   Social History  Social History Narrative   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    78 year old daughter at [place]   68 year old son   Long-term relationship    GED degree    Immunization History  Administered Date(s) Administered   Influenza, High Dose Seasonal PF 01/12/2017   Influenza,inj,Quad PF,6+ Mos 04/24/2016, 01/12/2017,  01/26/2018, 12/28/2018     Objective: Vital Signs: There were no vitals taken for this visit.   Physical Exam   Musculoskeletal Exam: ***  CDAI Exam: CDAI Score: -- Patient Global: --; Provider Global: -- Swollen: --; Tender: -- Joint Exam 12/02/2021   No joint exam has been documented for this visit   There is currently no information documented on the homunculus. Go to the Rheumatology activity and complete the homunculus joint exam.  Investigation: No additional findings.  Imaging: No results found.  Recent Labs: Lab Results  Component Value Date   WBC 12.5 (H) 08/19/2021   HGB 13.3 08/19/2021   PLT 341 08/19/2021   NA 136 08/19/2021   K 3.5 08/19/2021   CL 100 08/19/2021   CO2 27 08/19/2021   GLUCOSE 114 (H) 08/19/2021   BUN 22 (H) 08/19/2021   CREATININE 0.68 08/19/2021   BILITOT 0.4 07/04/2021   ALKPHOS 53 07/04/2021   AST 13 07/04/2021   ALT 12 07/04/2021   PROT 6.5 07/04/2021   ALBUMIN 4.0 07/04/2021   CALCIUM 9.0 08/19/2021   GFRAA >60 10/13/2014    Speciality Comments: No specialty comments available.  Procedures:  No procedures performed Allergies: Sulfa antibiotics   Assessment / Plan:     Visit Diagnoses: No diagnosis found.  Orders: No orders of the defined types were placed in this encounter.  No orders of the defined types were placed in this encounter.   Face-to-face time spent with patient was *** minutes. Greater than 50% of time was spent in counseling and coordination of care.  Follow-Up Instructions: No follow-ups on file.   Collier Salina, MD  Note - This record has been created using [employer].  Chart creation errors have been sought, but may not always  have been located. Such creation errors do not reflect on  the standard of medical care.

## 2021-12-02 ENCOUNTER — Ambulatory Visit: Payer: BC Managed Care – PPO | Admitting: Internal Medicine

## 2022-01-20 ENCOUNTER — Encounter (INDEPENDENT_AMBULATORY_CARE_PROVIDER_SITE_OTHER): Payer: Self-pay

## 2022-04-17 ENCOUNTER — Ambulatory Visit: Payer: BC Managed Care – PPO | Admitting: Nurse Practitioner

## 2022-04-22 ENCOUNTER — Ambulatory Visit: Payer: BC Managed Care – PPO | Admitting: Family Medicine

## 2022-04-29 ENCOUNTER — Encounter (INDEPENDENT_AMBULATORY_CARE_PROVIDER_SITE_OTHER): Payer: BC Managed Care – PPO | Admitting: Nurse Practitioner

## 2022-04-29 ENCOUNTER — Encounter (INDEPENDENT_AMBULATORY_CARE_PROVIDER_SITE_OTHER): Payer: BC Managed Care – PPO

## 2022-05-28 ENCOUNTER — Encounter (INDEPENDENT_AMBULATORY_CARE_PROVIDER_SITE_OTHER): Payer: BC Managed Care – PPO

## 2022-05-28 ENCOUNTER — Encounter (INDEPENDENT_AMBULATORY_CARE_PROVIDER_SITE_OTHER): Payer: BC Managed Care – PPO | Admitting: Nurse Practitioner

## 2022-07-07 ENCOUNTER — Encounter: Payer: Self-pay | Admitting: Family

## 2022-07-07 ENCOUNTER — Other Ambulatory Visit: Payer: Self-pay

## 2022-07-07 ENCOUNTER — Ambulatory Visit (INDEPENDENT_AMBULATORY_CARE_PROVIDER_SITE_OTHER): Payer: BC Managed Care – PPO | Admitting: Family

## 2022-07-07 ENCOUNTER — Ambulatory Visit (INDEPENDENT_AMBULATORY_CARE_PROVIDER_SITE_OTHER): Payer: BC Managed Care – PPO

## 2022-07-07 VITALS — BP 138/82 | HR 70 | Temp 98.6°F | Ht 64.0 in | Wt 212.0 lb

## 2022-07-07 DIAGNOSIS — M79642 Pain in left hand: Secondary | ICD-10-CM | POA: Diagnosis not present

## 2022-07-07 DIAGNOSIS — M25511 Pain in right shoulder: Secondary | ICD-10-CM

## 2022-07-07 DIAGNOSIS — E538 Deficiency of other specified B group vitamins: Secondary | ICD-10-CM | POA: Diagnosis not present

## 2022-07-07 DIAGNOSIS — M19041 Primary osteoarthritis, right hand: Secondary | ICD-10-CM | POA: Diagnosis not present

## 2022-07-07 DIAGNOSIS — M79641 Pain in right hand: Secondary | ICD-10-CM

## 2022-07-07 DIAGNOSIS — M19042 Primary osteoarthritis, left hand: Secondary | ICD-10-CM | POA: Diagnosis not present

## 2022-07-07 DIAGNOSIS — M4802 Spinal stenosis, cervical region: Secondary | ICD-10-CM | POA: Diagnosis not present

## 2022-07-07 DIAGNOSIS — F411 Generalized anxiety disorder: Secondary | ICD-10-CM

## 2022-07-07 DIAGNOSIS — M47812 Spondylosis without myelopathy or radiculopathy, cervical region: Secondary | ICD-10-CM | POA: Diagnosis not present

## 2022-07-07 MED ORDER — DULOXETINE HCL 30 MG PO CPEP
ORAL_CAPSULE | ORAL | 3 refills | Status: DC
  Filled 2022-07-07: qty 60, 30d supply, fill #0
  Filled 2022-08-01: qty 60, 30d supply, fill #1

## 2022-07-07 MED ORDER — CYCLOBENZAPRINE HCL 5 MG PO TABS
5.0000 mg | ORAL_TABLET | Freq: Three times a day (TID) | ORAL | 2 refills | Status: DC | PRN
  Filled 2022-07-07: qty 30, 10d supply, fill #0
  Filled 2022-08-05: qty 30, 10d supply, fill #1

## 2022-07-07 MED ORDER — HYDROXYZINE HCL 10 MG PO TABS
10.0000 mg | ORAL_TABLET | Freq: Two times a day (BID) | ORAL | 1 refills | Status: DC | PRN
  Filled 2022-07-07: qty 60, 15d supply, fill #0

## 2022-07-07 MED ORDER — DICLOFENAC SODIUM 1 % EX GEL
4.0000 g | Freq: Four times a day (QID) | CUTANEOUS | 3 refills | Status: DC
  Filled 2022-07-07: qty 100, 7d supply, fill #0

## 2022-07-07 MED ORDER — OMEPRAZOLE 20 MG PO CPDR
20.0000 mg | DELAYED_RELEASE_CAPSULE | Freq: Every day | ORAL | 3 refills | Status: DC
  Filled 2022-07-07: qty 90, 90d supply, fill #0

## 2022-07-07 MED ORDER — MELOXICAM 7.5 MG PO TABS
7.5000 mg | ORAL_TABLET | Freq: Every day | ORAL | 3 refills | Status: DC | PRN
  Filled 2022-07-07: qty 90, 90d supply, fill #0

## 2022-07-07 NOTE — Progress Notes (Unsigned)
   Assessment & Plan:  There are no diagnoses linked to this encounter.   Return precautions given.   Risks, benefits, and alternatives of the medications and treatment plan prescribed today were discussed, and patient expressed understanding.   Education regarding symptom management and diagnosis given to patient on AVS either electronically or printed.  No follow-ups on file.  Rennie Plowman, FNP  Subjective:    Patient ID: Nicole Escobar, female    DOB: 01-Jan-1976, 47 y.o.   MRN: [id number]  CC: Nicole Escobar is a 46 y.o. female who presents today for follow up.   HPI:  Long-term relationship - husband passed unexpectedly in 3 weeks from cancer 2024  She is tearful  She also complains of bilateral MCP pain numbness in hands. 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   Missed rheumatology appointment       Famiy hx of RA  Previously on Cymbalta, Zoloft Allergies: Sulfa antibiotics Current Outpatient Medications on File Prior to Visit  Medication Sig Dispense Refill   cyclobenzaprine (FLEXERIL) 5 MG tablet Take 1 tablet (5 mg total) by mouth 3 (three) times daily as needed for muscle spasms. 30 tablet 2   meloxicam (MOBIC) 7.5 MG tablet Take 1 tablet (7.5 mg total) by mouth daily as needed for pain. 30 tablet 2   No current facility-administered medications on file prior to visit.    Review of Systems    Objective:    There were no vitals taken for this visit. BP Readings from Last 3 Encounters:  11/19/21 132/86  08/20/21 132/78  08/19/21 (!) 155/101   Wt Readings from Last 3 Encounters:  11/19/21 226 lb (102.5 kg)  08/20/21 222 lb (100.7 kg)  08/19/21 225 lb (102.1 kg)    Physical Exam

## 2022-07-08 LAB — B12 AND FOLATE PANEL
Folate: 5.4 ng/mL — ABNORMAL LOW (ref >5.9)
Vitamin B-12: 169 pg/mL — ABNORMAL LOW (ref 211–911)

## 2022-07-08 NOTE — Assessment & Plan Note (Signed)
Acutely worsened with grief reaction after loss of loved one.  Start Cymbalta 30 mg and titrate.  Provided Atarax 10 mg twice daily as needed.  Referral to social work due to financial stress.

## 2022-07-08 NOTE — Patient Instructions (Signed)
I placed a referral to social work. Let us know if you dont hear back within a week in regards to an appointment being scheduled.   So that you are aware, if you are [facility] MyChart user , please pay attention to your MyChart messages as you may receive a MyChart message with a phone number to call and schedule this test/appointment own your own from our referral coordinator. This is a new process so I do not want you to miss this message.  If you are not a MyChart user, you will receive a phone call.    start Cymbalta 30 mg Start Atarax 10 mg   trial Voltaren gel for your hand pain.  You also may use meloxicam which is a potent anti-inflammatory.  A couple of points in regards to meloxicam ( Mobic) -  This medication is not intended for daily , long term use. It is a potent anti inflammatory ( NSAID), and my intention is for you take as needed for moderate to severe pain. If you find yourself using daily, please let me know.   Please takes Mobic ( meloxicam) with FOOD since it is an anti-inflammatory as it can cause a GI bleed or ulcer. If you have a history of GI bleed or ulcer, please do NOT take.  Do no take over the counter aleve, motrin, advil, goody's powder for pain as they are also NSAIDs, and they are  in the same class as Mobic  Lastly, we will need to monitor kidney function while on Mobic, and if we were to see any decline in kidney function in the future, we would have to discontinue this medication.

## 2022-07-08 NOTE — Assessment & Plan Note (Signed)
Concern for rheumatoid arthritis versus carpal tunnel versus osteoarthritis.  Recent history of positive ANA, patient missed rheumatology appointment.  Repeating ANA labs with x-ray today.  Trial Voltaren gel, Cymbalta 30 mg.  I provided her with meloxicam 7.5 mg to take daily for short interim.  Advised her to take omeprazole while she is on meloxicam to prevent PUD.  Close follow-up

## 2022-07-09 ENCOUNTER — Telehealth: Payer: Self-pay

## 2022-07-09 LAB — ANTI-NUCLEAR AB-TITER (ANA TITER): ANA Titer 1: 1:40 {titer} — ABNORMAL HIGH

## 2022-07-09 LAB — ANA: Anti Nuclear Antibody (ANA): POSITIVE — AB

## 2022-07-09 NOTE — Telephone Encounter (Signed)
   Telephone encounter was:  Unsuccessful.  07/09/2022 Name: Nicole Escobar MRN: [id number] DOB: 01-Dec-1975  Unsuccessful outbound call made today to assist with:   financial, housing, food.  Outreach Attempt:  1st Attempt  A HIPAA compliant voice message was left requesting a return call.  Instructed patient to call back at [phone].  Iyari Hagner [facility]  [facility] Population Health Community Resource Care Guide   [email or web address]  ?? [phone]   Website: [email or web address]

## 2022-07-11 ENCOUNTER — Telehealth: Payer: Self-pay

## 2022-07-11 NOTE — Telephone Encounter (Signed)
   Telephone encounter was:  Unsuccessful.  07/11/2022 Name: Nicole Escobar MRN: [id number] DOB: Nov 27, 1975  Unsuccessful outbound call made today to assist with:  Food Insecurity and housing, financial.  Outreach Attempt:  2nd Attempt  A HIPAA compliant voice message was left requesting a return call.  Instructed patient to call back at [phone].  Livio Ledwith [facility]  [facility] Population Health Community Resource Care Guide   [email or web address]  ?? [phone]   Website: [email or web address]  [email or web address]

## 2022-07-11 NOTE — Telephone Encounter (Signed)
F/u   Patient has additional questions regarding B12 injection.

## 2022-07-11 NOTE — Telephone Encounter (Signed)
Pt has seen results but LVM to call back to schedule b12 injections and to se if pt had any questions

## 2022-07-14 ENCOUNTER — Telehealth: Payer: Self-pay

## 2022-07-14 NOTE — Telephone Encounter (Signed)
   Telephone encounter was:  Unsuccessful.  07/14/2022 Name: Nicole Escobar MRN: [id number] DOB: 28-Oct-1975  Unsuccessful outbound call made today to assist with:  Food Insecurity and housing, financial.  Outreach Attempt:  3rd Attempt.  Referral closed unable to contact patient.  A HIPAA compliant voice message was left requesting a return call.  Instructed patient to call back at [phone].  Jisel Fleet [facility]  [facility] Population Health Community Resource Care Guide   [email or web address]  ?? [phone]   Website: [email or web address]  [email or web address]

## 2022-07-15 ENCOUNTER — Ambulatory Visit (INDEPENDENT_AMBULATORY_CARE_PROVIDER_SITE_OTHER): Payer: BC Managed Care – PPO

## 2022-07-15 DIAGNOSIS — E538 Deficiency of other specified B group vitamins: Secondary | ICD-10-CM

## 2022-07-15 MED ORDER — CYANOCOBALAMIN 1000 MCG/ML IJ SOLN
1000.0000 ug | Freq: Once | INTRAMUSCULAR | Status: AC
  Administered 2022-07-15: 1000 ug via INTRAMUSCULAR

## 2022-07-15 NOTE — Progress Notes (Signed)
After obtaining consent, and per orders of Dr. Lorin Picket, injection of B12 given IM in left deltoid by Valentino Nose. This was patient's first B12 injection.  Education provided. Patient tolerated injection well.

## 2022-07-15 NOTE — Telephone Encounter (Signed)
Spoke to pt and scheduled her for b12 injections

## 2022-07-22 ENCOUNTER — Ambulatory Visit (INDEPENDENT_AMBULATORY_CARE_PROVIDER_SITE_OTHER): Payer: BC Managed Care – PPO

## 2022-07-22 DIAGNOSIS — E538 Deficiency of other specified B group vitamins: Secondary | ICD-10-CM

## 2022-07-22 MED ORDER — CYANOCOBALAMIN 1000 MCG/ML IJ SOLN
1000.0000 ug | Freq: Once | INTRAMUSCULAR | Status: AC
  Administered 2022-07-22: 1000 ug via INTRAMUSCULAR

## 2022-07-22 NOTE — Progress Notes (Deleted)
Patient presented for B 12 injection to right deltoid, patient voiced no concerns nor showed any signs of distress during injection. 

## 2022-07-22 NOTE — Progress Notes (Signed)
Patient presented for B 12 injection to right deltoid, patient voiced no concerns nor showed any signs of distress during injection. 

## 2022-07-25 ENCOUNTER — Ambulatory Visit
Admission: RE | Admit: 2022-07-25 | Discharge: 2022-07-25 | Disposition: A | Discharge status: Home / Self Care | Payer: BC Managed Care – PPO | Source: Ambulatory Visit | Attending: Family | Admitting: Family

## 2022-07-25 ENCOUNTER — Other Ambulatory Visit: Payer: Self-pay | Admitting: Family

## 2022-07-25 ENCOUNTER — Ambulatory Visit (INDEPENDENT_AMBULATORY_CARE_PROVIDER_SITE_OTHER): Payer: BC Managed Care – PPO | Admitting: Family

## 2022-07-25 ENCOUNTER — Encounter: Payer: Self-pay | Admitting: Family

## 2022-07-25 VITALS — BP 142/84 | HR 90 | Temp 97.5°F | Ht 64.0 in | Wt 210.0 lb

## 2022-07-25 DIAGNOSIS — R768 Other specified abnormal immunological findings in serum: Secondary | ICD-10-CM

## 2022-07-25 DIAGNOSIS — R109 Unspecified abdominal pain: Secondary | ICD-10-CM | POA: Diagnosis not present

## 2022-07-25 DIAGNOSIS — F411 Generalized anxiety disorder: Secondary | ICD-10-CM

## 2022-07-25 DIAGNOSIS — M545 Low back pain, unspecified: Secondary | ICD-10-CM | POA: Insufficient documentation

## 2022-07-25 DIAGNOSIS — I700 Atherosclerosis of aorta: Secondary | ICD-10-CM | POA: Diagnosis not present

## 2022-07-25 DIAGNOSIS — R1032 Left lower quadrant pain: Secondary | ICD-10-CM | POA: Diagnosis not present

## 2022-07-25 DIAGNOSIS — M47816 Spondylosis without myelopathy or radiculopathy, lumbar region: Secondary | ICD-10-CM | POA: Diagnosis not present

## 2022-07-25 DIAGNOSIS — R110 Nausea: Secondary | ICD-10-CM | POA: Diagnosis not present

## 2022-07-25 DIAGNOSIS — R7689 Other specified abnormal immunological findings in serum: Secondary | ICD-10-CM

## 2022-07-25 DIAGNOSIS — Z1211 Encounter for screening for malignant neoplasm of colon: Secondary | ICD-10-CM

## 2022-07-25 MED ORDER — PROPRANOLOL HCL 10 MG PO TABS: 10.0000 mg | ORAL_TABLET | Freq: Two times a day (BID) | ORAL | 1 refills | Status: DC | PRN

## 2022-07-25 MED ORDER — TRAMADOL HCL 50 MG PO TABS: 50.0000 mg | ORAL_TABLET | Freq: Two times a day (BID) | ORAL | 0 refills | Status: AC | PRN

## 2022-07-25 MED ORDER — GABAPENTIN 100 MG PO CAPS: 100.0000 mg | ORAL_CAPSULE | Freq: Three times a day (TID) | ORAL | 3 refills | Status: DC

## 2022-07-25 NOTE — Addendum Note (Signed)
Addended by: Clearnce Sorrel on: 07/25/2022 03:43 PM   Modules accepted: Orders

## 2022-07-25 NOTE — Assessment & Plan Note (Signed)
Nauseated from pain.  Patient is nontoxic in appearance however she is uncomfortable while sitting.  Pain improves when asked patient to lay on exam table supine.  Differentials include spinal stenosis, sacroiliac joint dysfunction, renal stone.  Pending stat CT renal, x-ray lumbar spine, urine studies.  Addendum:  Patient walked into the office after CT renal scan.  Requesting work note and also asking what to do for pain  I brought her into her room to discussed CT renal . Negative for renal stone.  UA without RBCs. Presentation c/w sacroiliac dysfunction,DDD, muscle spasm. Pending lumbar Xray at this time.   We briefly discussed atherosclerosis and agreed to discuss this at follow-up.  She asked for tramadol for pain.  We had heartfelt discussion as it relates to her history of crack abuse in her 74s.  She has been sober since 2007.  No alcohol use or illicit drug use at this time.  We agreed short supply of tramadol with caution and indiscretion was appropriate. Advised to store in safe place.   Provided tramadol 50 mg twice daily as needed,  12 tablets.  I also provided her with gabapentin 100 mg 3 times daily for chronic pain.  Advised her not to start Cymbalta until after pain has improved as concern for interaction between Cymbalta and tramadol. Replaced referral to rheumatology due to h/o positive ANA , arthalgias; she had missed recent appointment with rheumatology due to partner's death 06/01/2022. Close follow up with patient.   I looked up patient on  Controlled Substances Reporting System PMP AWARE and saw no activity that raised concern of inappropriate use.

## 2022-07-25 NOTE — Patient Instructions (Addendum)
Please call [facility] rheumatology when you can, phone number [phone] to schedule an appointment.  Please let me know if any trouble in scheduling.   I would encourage you to start Cymbalta 30 mg.  This is a wonderful medication for anxiety as well as chronic pain You may stop hydroxyzine for anxiety since was not effective.  You may start propranolol 10 mg taken twice daily.  If ineffective, you may increase to propranolol 20 mg twice daily after 2 or 3 days.  I ordered a stat CT abdomen and pelvis today.  Please keep your cell phone with you as I will call you with any critical results.  For lumbar x-ray, please go to the medical mall today.  No appointment is required

## 2022-07-25 NOTE — Progress Notes (Signed)
Assessment & Plan:  Left flank pain Assessment & Plan: Nauseated from pain.  Patient is nontoxic in appearance however she is uncomfortable while sitting.  Pain improves when asked patient to lay on exam table supine.  Differentials include spinal stenosis, sacroiliac joint dysfunction, renal stone.  Pending stat CT renal, x-ray lumbar spine, urine studies.  Addendum:  Patient walked into the office after CT renal scan.  Requesting work note and also asking what to do for pain  I brought her into her room to discussed CT renal . Negative for renal stone.  UA without RBCs. Presentation c/w sacroiliac dysfunction,DDD, muscle spasm. Pending lumbar Xray at this time.   We briefly discussed atherosclerosis and agreed to discuss this at follow-up.  She asked for tramadol for pain.  We had heartfelt discussion as it relates to her history of crack abuse in her 29s.  She has been sober since 2007.  No alcohol use or illicit drug use at this time.  We agreed short supply of tramadol with caution and indiscretion was appropriate. Advised to store in safe place.   Provided tramadol 50 mg twice daily as needed,  12 tablets.  I also provided her with gabapentin 100 mg 3 times daily for chronic pain.  Advised her not to start Cymbalta until after pain has improved as concern for interaction between Cymbalta and tramadol. Replaced referral to rheumatology due to h/o positive ANA , arthalgias; she had missed recent appointment with rheumatology due to partner's death June 09, 2022. Close follow up with patient.   I looked up patient on [place] Controlled Substances Reporting System PMP AWARE and saw no activity that raised concern of inappropriate use.    Orders: -     DG Lumbar Spine Complete; Future -     CT RENAL STONE STUDY; Future -     Urine Culture -     Urinalysis, Routine w reflex microscopic  GAD (generalized anxiety disorder) Assessment & Plan: Uncontrolled.  Reassured patient that Cymbalta is a different  drug class than Zoloft.  Encouraged her to consider trialing this medication for chronic pain and anxiety.  I suspect it may be helpful for her.  She is going to try Cymbalta and plans to start when she is home.  Advised that she may stop Atarax as did not seem to be effective for her.  Trial of propranolol 10 mg twice daily for anxiety.  Consider trial of BuSpar.  Orders: -     Propranolol HCl; Take 1 tablet (10 mg total) by mouth 2 (two) times daily as needed (anxiety).  Dispense: 120 tablet; Refill: 1  Abdominal pain, unspecified abdominal location  Atherosclerosis of aorta (HCC)  Screen for colon cancer -     Ambulatory referral to Gastroenterology     Return precautions given.   Risks, benefits, and alternatives of the medications and treatment plan prescribed today were discussed, and patient expressed understanding.   Education regarding symptom management and diagnosis given to patient on AVS either electronically or printed.  Return in about 1 month (around 08/25/2022).  Rennie Plowman, FNP  Subjective:    Patient ID: Nicole Escobar, female    DOB: 1975-08-03, 47 y.o.   MRN: [id number]  CC: Nicole Escobar is a 47 y.o. female who presents today for an acute visit.    HPI: Left low back pain x 3 days Acute onset no injury.  Pain is worse with long periods of standing as she works at the [employer].  Pain resolves when laying supine.  She has been taking ibuprofen and Tylenol without relief.   She has felt nauseated from pain.  Her husband passed for abdominal cancer and she is concerned for her kidneys.  No numbness, saddle anesthesia, urinary incontinence ,hematuria, dysuria, abdominal pain, constipation, vomiting.  She had episode of diarrhea yesterday, since resolved  No h/o renal stone  Hand pain has improved somewhat.   Anxiety is unchanged.      She is not taking Cymbalta 30 mg as she was worried about starting. She is tried atarax and increased to 40mg  in  one day while at work as she had panic attacks which she did not find helpful.  She has since stopped taking medication.   She took zoloft in the past and she did not feel like was ineffective . She took BuSpar almost 20 years ago and felt brain fog   She is taking meloxicam with omeprazole  She is receiving B12 injections weekly  Patient has history of crack cocaine abuse in her 33's.  She has been sober since 2007.  No alcohol use. Denies any illicit drug use, IV drug use at this time.   Allergies: Sulfa antibiotics Current Outpatient Medications on File Prior to Visit  Medication Sig Dispense Refill   cyanocobalamin (VITAMIN B12) 1000 MCG tablet Take 1,000 mcg by mouth daily.     cyclobenzaprine (FLEXERIL) 5 MG tablet Take 1 tablet (5 mg total) by mouth 3 (three) times daily as needed for muscle spasms. 30 tablet 2   diclofenac Sodium (VOLTAREN) 1 % GEL Apply 4 g topically 4 (four) times daily. 100 g 3   DULoxetine (CYMBALTA) 30 MG capsule Take 1 capsule (30 mg total) by mouth daily for 7 days, THEN increase to 2 capsules (60 mg total) daily. 90 capsule 3   hydrOXYzine (ATARAX) 10 MG tablet Take 1-2 tablets (10-20 mg total) by mouth 2 (two) times daily as needed for anxiety. 60 tablet 1   meloxicam (MOBIC) 7.5 MG tablet Take 1 tablet (7.5 mg total) by mouth daily as needed for pain. Take with prilosec. 90 tablet 3   omeprazole (PRILOSEC) 20 MG capsule Take 1 capsule (20 mg total) by mouth daily. Take 30 minutes to 1 hour before breakfast 90 capsule 3   No current facility-administered medications on file prior to visit.    Review of Systems  Constitutional:  Negative for chills and fever.  Respiratory:  Negative for cough.   Cardiovascular:  Negative for chest pain and palpitations.  Gastrointestinal:  Positive for nausea. Negative for abdominal pain, constipation, diarrhea and vomiting.  Genitourinary:  Negative for hematuria.  Musculoskeletal:  Positive for back pain.   Psychiatric/Behavioral:  The patient is nervous/anxious.       Objective:    BP (!) 142/84   Pulse 90   Temp (!) 97.5 F (36.4 C) (Oral)   Ht 5\' 4"  (1.626 m)   Wt 210 lb (95.3 kg)   LMP  (LMP Unknown)   SpO2 96%   BMI 36.05 kg/m   BP Readings from Last 3 Encounters:  07/25/22 (!) 142/84  07/07/22 138/82  11/19/21 132/86   Wt Readings from Last 3 Encounters:  07/25/22 210 lb (95.3 kg)  07/07/22 212 lb (96.2 kg)  11/19/21 226 lb (102.5 kg)    Physical Exam Vitals reviewed.  Constitutional:      Appearance: Normal appearance. She is well-developed.  Eyes:     Conjunctiva/sclera: Conjunctivae normal.  Cardiovascular:  Rate and Rhythm: Normal rate and regular rhythm.     Pulses: Normal pulses.     Heart sounds: Normal heart sounds.  Pulmonary:     Effort: Pulmonary effort is normal.     Breath sounds: Normal breath sounds. No wheezing, rhonchi or rales.  Abdominal:     General: Bowel sounds are normal. There is no distension.     Palpations: Abdomen is soft. Abdomen is not rigid. There is no fluid wave or mass.     Tenderness: There is no abdominal tenderness. There is no right CVA tenderness, left CVA tenderness, guarding or rebound.  Musculoskeletal:     Lumbar back: No swelling, edema, spasms, tenderness or bony tenderness. Normal range of motion.     Comments: Full range of motion with flexion, tension, lateral side bends. No bony tenderness. Left low back pain with left and right single leg raise.  No numbness, tingling elicited with single leg raise bilaterally.   Skin:    General: Skin is warm and dry.  Neurological:     Mental Status: She is alert.     Sensory: No sensory deficit.     Deep Tendon Reflexes:     Reflex Scores:      Patellar reflexes are 2+ on the right side and 2+ on the left side.    Comments: Sensation and strength intact bilateral lower extremities.  Psychiatric:        Speech: Speech normal.        Behavior: Behavior normal.         Thought Content: Thought content normal.

## 2022-07-25 NOTE — Assessment & Plan Note (Signed)
Uncontrolled.  Reassured patient that Cymbalta is a different drug class than Zoloft.  Encouraged her to consider trialing this medication for chronic pain and anxiety.  I suspect it may be helpful for her.  She is going to try Cymbalta and plans to start when she is home.  Advised that she may stop Atarax as did not seem to be effective for her.  Trial of propranolol 10 mg twice daily for anxiety.  Consider trial of BuSpar.

## 2022-07-26 ENCOUNTER — Encounter: Payer: Self-pay | Admitting: Family

## 2022-07-26 DIAGNOSIS — I700 Atherosclerosis of aorta: Secondary | ICD-10-CM | POA: Insufficient documentation

## 2022-07-26 LAB — URINE CULTURE
MICRO NUMBER:: 14910809
Result:: NO GROWTH
SPECIMEN QUALITY:: ADEQUATE

## 2022-07-26 LAB — URINALYSIS, ROUTINE W REFLEX MICROSCOPIC
Bilirubin Urine: NEGATIVE
Glucose, UA: NEGATIVE
Hgb urine dipstick: NEGATIVE
Ketones, ur: NEGATIVE
Leukocytes,Ua: NEGATIVE
Nitrite: NEGATIVE
Protein, ur: NEGATIVE
Specific Gravity, Urine: 1.017 (ref 1.001–1.035)
pH: 6 (ref 5.0–8.0)

## 2022-07-26 NOTE — Addendum Note (Signed)
Addended by: Allegra Grana on: 07/26/2022 08:18 AM   Modules accepted: Orders

## 2022-07-28 ENCOUNTER — Emergency Department
Admission: EM | Admit: 2022-07-28 | Discharge: 2022-07-28 | Disposition: A | Discharge status: Home / Self Care | Payer: BC Managed Care – PPO | Attending: Emergency Medicine | Admitting: Emergency Medicine

## 2022-07-28 ENCOUNTER — Other Ambulatory Visit: Payer: Self-pay

## 2022-07-28 DIAGNOSIS — M5459 Other low back pain: Secondary | ICD-10-CM | POA: Diagnosis not present

## 2022-07-28 DIAGNOSIS — M545 Low back pain, unspecified: Secondary | ICD-10-CM | POA: Diagnosis not present

## 2022-07-28 DIAGNOSIS — M461 Sacroiliitis, not elsewhere classified: Secondary | ICD-10-CM | POA: Insufficient documentation

## 2022-07-28 LAB — CBC
HCT: 43.3 % (ref 36.0–46.0)
Hemoglobin: 14.5 g/dL (ref 12.0–15.0)
MCH: 28.3 pg (ref 26.0–34.0)
MCHC: 33.5 g/dL (ref 30.0–36.0)
MCV: 84.4 fL (ref 80.0–100.0)
Platelets: 309 10*3/uL (ref 150–400)
RBC: 5.13 MIL/uL — ABNORMAL HIGH (ref 3.87–5.11)
RDW: 13.2 % (ref 11.5–15.5)
WBC: 7 10*3/uL (ref 4.0–10.5)
nRBC: 0 % (ref 0.0–0.2)

## 2022-07-28 LAB — COMPREHENSIVE METABOLIC PANEL
ALT: 23 U/L (ref 0–44)
AST: 37 U/L (ref 15–41)
Albumin: 4.3 g/dL (ref 3.5–5.0)
Alkaline Phosphatase: 48 U/L (ref 38–126)
Anion gap: 9 (ref 5–15)
BUN: 9 mg/dL (ref 6–20)
CO2: 21 mmol/L — ABNORMAL LOW (ref 22–32)
Calcium: 9.1 mg/dL (ref 8.9–10.3)
Chloride: 108 mmol/L (ref 98–111)
Creatinine, Ser: 0.74 mg/dL (ref 0.44–1.00)
GFR, Estimated: >60 mL/min (ref >60)
Glucose, Bld: 109 mg/dL — ABNORMAL HIGH (ref 70–99)
Potassium: 3.5 mmol/L (ref 3.5–5.1)
Sodium: 138 mmol/L (ref 135–145)
Total Bilirubin: 0.5 mg/dL (ref 0.3–1.2)
Total Protein: 7.5 g/dL (ref 6.5–8.1)

## 2022-07-28 LAB — LIPASE, BLOOD: Lipase: 31 U/L (ref 11–51)

## 2022-07-28 MED ORDER — HYDROCODONE-ACETAMINOPHEN 5-325 MG PO TABS: 1.0000 | ORAL_TABLET | ORAL | 0 refills | Status: DC | PRN

## 2022-07-28 MED ORDER — PREDNISONE 20 MG PO TABS: 40.0000 mg | ORAL_TABLET | Freq: Every day | ORAL | 0 refills | Status: AC

## 2022-07-28 NOTE — ED Provider Notes (Signed)
[facility] Provider Note    Event Date/Time   First MD Initiated Contact with Patient 07/28/22 [id number]     (approximate)  History   Chief Complaint: Back Pain  HPI  Nicole Escobar is a 47 y.o. female with no significant past medical history who presents to the emergency department for lower back pain.  According to the patient for the past week or so she has been experiencing significant pain over the left lower back.  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  States she has had this pain before but this is the worst it has been.  Patient states she went to her doctor Friday I was able to review the patient's urinalysis as well as CT scan that was ordered with no findings on either.  Denies any radiation down into the leg denies any weakness or numbness of the leg denies any incontinence.  Physical Exam   Triage Vital Signs: ED Triage Vitals [07/28/22 0718]  Enc Vitals Group     BP (!) 148/92     Pulse Rate 86     Resp 18     Temp 98 F (36.7 C)     Temp src      SpO2 99 %     Weight      Height      Head Circumference      Peak Flow      Pain Score 10     Pain Loc      Pain Edu?      Excl. in GC?     Most recent vital signs: Vitals:   07/28/22 0718  BP: (!) 148/92  Pulse: 86  Resp: 18  Temp: 98 F (36.7 C)  SpO2: 99%    General: Awake, no distress.  CV:  Good peripheral perfusion.  Regular rate and rhythm  Resp:  Normal effort.  Equal breath sounds bilaterally.  Abd:  No distention.  Soft, nontender.  No rebound or guarding.  Benign anterior abdomen.  No CVA tenderness. Other:  Mild to moderate pain over the left SI joint area.   ED Results / Procedures / Treatments    MEDICATIONS ORDERED IN ED: Medications - No data to display   IMPRESSION / MDM / ASSESSMENT AND PLAN / ED COURSE  I reviewed the triage vital signs and the nursing  notes.  Patient's presentation is most consistent with acute presentation with potential threat to life or bodily function.  Patient presents emergency department for left lower back pain.  Patient was seen by her doctor for the same 3 days ago was able to review the urinalysis that was performed at that time which was normal.  CT scan that was performed at that time showed no acute findings.  We will check lab work today and continue to closely monitor.  I suspect sacroiliitis given the location of the pain and tenderness over the SI joint area.  No red flags such as incontinence weakness or numbness.  Lab work is reassuring, CBC shows no concerning findings normal white blood cell count, chemistry is reassuring.  LFTs and lipase are normal as well.  Given the location of the pain again highly suspect more sacroiliitis/back pain.  Will place the patient on a short course of steroids and pain medication have the patient follow-up with her doctor.  Patient agreeable to plan  of care.  FINAL CLINICAL IMPRESSION(S) / ED DIAGNOSES   Back pain Sacroiliitis  Rx / DC Orders   Prednisone Norco  Note:  This document was prepared using Dragon voice recognition software and may include unintentional dictation errors.   Minna Antis, MD 07/28/22 [phone]

## 2022-07-28 NOTE — ED Triage Notes (Signed)
Pt comes with c/o lower left back pain. Pt states she went to her PCP on Friday and has some stuff done. Pt states it was all neg. Pt states pain continues. Pt states no relief with flexeril.  Pt denies any known injuries. Pt denies any urinary symptoms.

## 2022-07-29 ENCOUNTER — Encounter: Payer: Self-pay | Admitting: Family

## 2022-07-29 ENCOUNTER — Ambulatory Visit: Payer: BC Managed Care – PPO

## 2022-07-29 ENCOUNTER — Ambulatory Visit (INDEPENDENT_AMBULATORY_CARE_PROVIDER_SITE_OTHER): Payer: BC Managed Care – PPO | Admitting: Family

## 2022-07-29 ENCOUNTER — Telehealth: Payer: Self-pay | Admitting: Family

## 2022-07-29 VITALS — BP 138/88 | HR 78 | Temp 97.6°F | Ht 64.0 in | Wt 205.6 lb

## 2022-07-29 DIAGNOSIS — M545 Low back pain, unspecified: Secondary | ICD-10-CM

## 2022-07-29 DIAGNOSIS — E538 Deficiency of other specified B group vitamins: Secondary | ICD-10-CM | POA: Diagnosis not present

## 2022-07-29 DIAGNOSIS — I700 Atherosclerosis of aorta: Secondary | ICD-10-CM

## 2022-07-29 DIAGNOSIS — I10 Essential (primary) hypertension: Secondary | ICD-10-CM

## 2022-07-29 DIAGNOSIS — R87619 Unspecified abnormal cytological findings in specimens from cervix uteri: Secondary | ICD-10-CM

## 2022-07-29 DIAGNOSIS — F411 Generalized anxiety disorder: Secondary | ICD-10-CM

## 2022-07-29 LAB — LIPID PANEL
Cholesterol: 231 mg/dL — ABNORMAL HIGH (ref 0–200)
HDL: 41.4 mg/dL (ref >39.00)
LDL Cholesterol: 158 mg/dL — ABNORMAL HIGH (ref 0–99)
NonHDL: 190.09
Total CHOL/HDL Ratio: 6
Triglycerides: 158 mg/dL — ABNORMAL HIGH (ref 0.0–149.0)
VLDL: 31.6 mg/dL (ref 0.0–40.0)

## 2022-07-29 MED ORDER — CYANOCOBALAMIN 1000 MCG/ML IJ SOLN
1000.0000 ug | Freq: Once | INTRAMUSCULAR | Status: AC
Start: 2022-07-29 — End: 2022-07-29
  Administered 2022-07-29: 1000 ug via INTRAMUSCULAR

## 2022-07-29 MED ORDER — ROSUVASTATIN CALCIUM 10 MG PO TABS
10.0000 mg | ORAL_TABLET | Freq: Every day | ORAL | 3 refills | Status: DC
Start: 2022-07-29 — End: 2023-07-27

## 2022-07-29 NOTE — Telephone Encounter (Signed)
Call [facility] radiology.  Still awaiting lumbar x-ray read from last week

## 2022-07-29 NOTE — Assessment & Plan Note (Signed)
Uncontrolled.  Start Cymbalta 30 mg. She has not started propranolol 10mg  bid. Will discuss at follow up.

## 2022-07-29 NOTE — Assessment & Plan Note (Addendum)
Discussed atherosclerosis on CT renal.  Family history of hyperlipidemia.  No family history CAD.  Start Crestor 10 mg. Will schedule hepatic enzymes next week at follow-up assuming patient has started medication.

## 2022-07-29 NOTE — Progress Notes (Signed)
Assessment & Plan:  Atherosclerosis of aorta [facility]) Assessment & Plan: Discussed atherosclerosis on CT renal.  Family history of hyperlipidemia.  No family history CAD.  Start Crestor 10 mg. Will schedule hepatic enzymes next week at follow-up assuming patient has started medication.    Orders: -     Lipid panel -     Rosuvastatin Calcium; Take 1 tablet (10 mg total) by mouth daily.  Dispense: 90 tablet; Refill: 3  Abnormal cervical Papanicolaou smear, unspecified abnormal pap finding -     Ambulatory referral to Obstetrics / Gynecology  B12 deficiency -     Cyanocobalamin  Acute left-sided low back pain without sciatica Assessment & Plan: Uncontrolled, improved from prior. Pain reproducible on exam.  Discussed with patient I suspect sacroiliac dysfunction.  Still awaiting lumbar x-ray read.  Call out to [facility] radiology to obtain.  Discussed patient medications she has felt overwhelmed with what to start.  Advised her most simply to start Cymbalta 30 mg while she is on prednisone 40 mg taper as provided by the emergency room.  She is using tramadol and Vicodin sparingly.  She may use Flexeril 5 mg as needed.  Advised to start gabapentin 100 mg 3 times daily in a week or 2 if pain is not improved.  Consider hip xray. 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 Consider orthopedic consult.  Close follow-up   GAD (generalized anxiety disorder) Assessment & Plan: Uncontrolled.  Start Cymbalta 30 mg. She has not started propranolol 10mg  bid. Will discuss at follow up.       Return precautions given.   Risks, benefits, and alternatives of the medications and treatment plan prescribed today were discussed, and patient expressed understanding.   Education regarding symptom management and diagnosis given to patient on AVS either electronically or printed.  Return in about 2 weeks (around 08/12/2022).  Rennie Plowman, FNP  Subjective:    Patient ID: Nicole Escobar,  female    DOB: [birth year], 47 y.o.   MRN: [id number]  CC: Nicole Escobar is a 47 y.o. female who presents today for follow up.   HPI: She continues to complain of left low back pain.  There has been some improvement.  No numbness in her legs, groin pain, saddle anesthesia, fever.      patient seen in the ED yesterday left sided sciatica.  Started on prednisone 40mg   x 5 days, vicodin 5-325mg  prn.   She hasn't started Cymbalta, propranolol.  Rare use of vacuum or tramadol if medications make her feel nauseated.  Blood work obtained in the ED.  White blood cells are normal.  LFTs and lipase normal as well.  Propranolol 10 mg twice daily, gabapentin 100mg  TID, cymbalta 30mg  qd   Allergies: Sulfa antibiotics Current Outpatient Medications on File Prior to Visit  Medication Sig Dispense Refill   cyanocobalamin (VITAMIN B12) 1000 MCG tablet Take 1,000 mcg by mouth daily.     cyclobenzaprine (FLEXERIL) 5 MG tablet Take 1 tablet (5 mg total) by mouth 3 (three) times daily as needed for muscle spasms. 30 tablet 2   diclofenac Sodium (VOLTAREN) 1 % GEL Apply 4 g topically 4 (four) times daily. 100 g 3   DULoxetine (CYMBALTA) 30 MG capsule Take 1 capsule (30 mg total) by mouth daily for 7 days, THEN increase to 2 capsules (60 mg total) daily. 90 capsule 3   gabapentin (NEURONTIN) 100 MG capsule Take 1 capsule (100 mg total) by mouth 3 (three)  times daily. 90 capsule 3   HYDROcodone-acetaminophen (NORCO/VICODIN) 5-325 MG tablet Take 1 tablet by mouth every 4 (four) hours as needed. 15 tablet 0   hydrOXYzine (ATARAX) 10 MG tablet Take 1-2 tablets (10-20 mg total) by mouth 2 (two) times daily as needed for anxiety. 60 tablet 1   meloxicam (MOBIC) 7.5 MG tablet Take 1 tablet (7.5 mg total) by mouth daily as needed for pain. Take with prilosec. 90 tablet 3   omeprazole (PRILOSEC) 20 MG capsule Take 1 capsule (20 mg total) by mouth daily. Take 30 minutes to 1 hour before breakfast 90 capsule 3   predniSONE  (DELTASONE) 20 MG tablet Take 2 tablets (40 mg total) by mouth daily with breakfast for 5 days. 10 tablet 0   propranolol (INDERAL) 10 MG tablet Take 1 tablet (10 mg total) by mouth 2 (two) times daily as needed (anxiety). 120 tablet 1   traMADol (ULTRAM) 50 MG tablet Take 1 tablet (50 mg total) by mouth every 12 (twelve) hours as needed for up to 6 days. 12 tablet 0   No current facility-administered medications on file prior to visit.    Review of Systems  Constitutional:  Negative for chills and fever.  Respiratory:  Negative for cough.   Cardiovascular:  Negative for chest pain and palpitations.  Gastrointestinal:  Negative for nausea and vomiting.  Musculoskeletal:  Positive for back pain.  Neurological:  Negative for numbness.  Psychiatric/Behavioral:  The patient is nervous/anxious.       Objective:    BP 138/88   Pulse 78   Temp 97.6 F (36.4 C) (Oral)   Ht 5\' 4"  (1.626 m)   Wt 205 lb 9.6 oz (93.3 kg)   LMP  (LMP Unknown)   SpO2 98%   BMI 35.29 kg/m  BP Readings from Last 3 Encounters:  07/29/22 138/88  07/28/22 (!) 148/92  07/25/22 (!) 142/84   Wt Readings from Last 3 Encounters:  07/29/22 205 lb 9.6 oz (93.3 kg)  07/25/22 210 lb (95.3 kg)  07/07/22 212 lb (96.2 kg)    Physical Exam Vitals reviewed.  Constitutional:      Appearance: She is well-developed.  Eyes:     Conjunctiva/sclera: Conjunctivae normal.  Cardiovascular:     Rate and Rhythm: Normal rate and regular rhythm.     Pulses: Normal pulses.     Heart sounds: Normal heart sounds.  Pulmonary:     Effort: Pulmonary effort is normal.     Breath sounds: Normal breath sounds. No wheezing, rhonchi or rales.  Musculoskeletal:     Lumbar back: No swelling, edema, spasms, tenderness or bony tenderness. Normal range of motion.     Comments: Full range of motion with flexion, tension, lateral side bends. No bony tenderness. Pain elicited with palpation of left SI joint. No pain elicited over left SI  with single leg raise of left leg.   No numbness, tingling elicited with single leg raise bilaterally.   Skin:    General: Skin is warm and dry.  Neurological:     Mental Status: She is alert.     Sensory: No sensory deficit.     Deep Tendon Reflexes:     Reflex Scores:      Patellar reflexes are 2+ on the right side and 2+ on the left side.    Comments: Sensation and strength intact bilateral lower extremities.  Psychiatric:        Speech: Speech normal.  Behavior: Behavior normal.        Thought Content: Thought content normal.

## 2022-07-29 NOTE — Assessment & Plan Note (Addendum)
Uncontrolled, improved from prior. Pain reproducible on exam.  Discussed with patient I suspect sacroiliac dysfunction.  Still awaiting lumbar x-ray read.  Call out to [facility] radiology to obtain.  Discussed patient medications she has felt overwhelmed with what to start.  Advised her most simply to start Cymbalta 30 mg while she is on prednisone 40 mg taper as provided by the emergency room.  She is using tramadol and Vicodin sparingly.  She may use Flexeril 5 mg as needed.  Advised to start gabapentin 100 mg 3 times daily in a week or 2 if pain is not improved.  Consider hip xray. 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 Consider orthopedic consult.  Close follow-up

## 2022-07-29 NOTE — Patient Instructions (Addendum)
Start Cymbalta today.  This medication is for chronic pain, anxiety After you get Cymbalta for a few weeks as discussed; start gabapentin and  we will titrate.    Sacroiliac Joint Dysfunction  Sacroiliac joint dysfunction is a condition that causes inflammation on one or both sides of the sacroiliac (SI) joint. The SI joint is the joint between two bones of the pelvis called the sacrum and the ilium. The sacrum is the bone at the base of the spine. The ilium is the large bone that forms the hip. This condition causes deep aching or burning pain in the low back. In some cases, the pain may also spread into one or both buttocks, hips, or thighs. What are the causes? This condition may be caused by: Pregnancy. During pregnancy, extra stress is put on the SI joints because the pelvis widens. Injury, such as: Injuries from car crashes. Sports-related injuries. Work-related injuries. Having one leg that is shorter than the other. Conditions that affect the joints, such as: Rheumatoid arthritis. Gout. Psoriatic arthritis. Joint infection (septic arthritis). Sometimes, the cause of SI joint dysfunction is not known. What are the signs or symptoms? Symptoms of this condition include: Aching or burning pain in the lower back. The pain may also spread to other areas, such as: Buttocks. Groin. Thighs. Muscle spasms in or around the painful areas. Increased pain when standing, walking, running, stair climbing, bending, or lifting. How is this diagnosed? This condition is diagnosed with a physical exam and your medical history. During the exam, the health care provider may move one or both of your legs to different positions to check for pain. Various tests may be done to confirm the diagnosis, including: Imaging tests to look for other causes of pain. These may include: MRI. CT scan. Bone scan. Diagnostic injection. A numbing medicine is injected into the SI joint using a needle. If your pain is  temporarily improved or stopped after the injection, this can indicate that SI joint dysfunction is the problem. How is this treated? Treatment depends on the cause and severity of your condition. Treatment options can be noninvasive and may include: Ice or heat applied to the lower back area after an injury. This may help reduce pain and muscle spasms. Medicines to relieve pain or inflammation or to relax the muscles. Wearing a back brace (sacroiliac brace) to help support the joint while your back is healing. Physical therapy to increase muscle strength around the joint and flexibility at the joint. This may also involve learning proper body positions and ways of moving to relieve stress on the joint. Direct manipulation of the SI joint. Use of a device that provides electrical stimulation to help reduce pain at the joint. Other treatments may include: Injections of steroid medicine into the joint to reduce pain and swelling. Radiofrequency ablation. This treatment uses heat to burn away nerves that are carrying pain messages from the joint. Surgery to put in screws and plates that limit or prevent joint motion. This is rare. Follow these instructions at home: Medicines Take over-the-counter and prescription medicines only as told by your health care provider. Ask your health care provider if the medicine prescribed to you: Requires you to avoid driving or using machinery. Can cause constipation. You may need to take these actions to prevent or treat constipation: Drink enough fluid to keep your urine pale yellow. Take over-the-counter or prescription medicines. Eat foods that are high in fiber, such as beans, whole grains, and fresh fruits and vegetables.  Limit foods that are high in fat and processed sugars, such as fried or sweet foods. If you have a brace: Wear the brace as told by your health care provider. Remove it only as told by your health care provider. Keep the brace clean. If  the brace is not waterproof: Do not let it get wet. Cover it with a watertight covering when you take a bath or a shower. Managing pain, stiffness, and swelling     Icing can help with pain and swelling. Heat may help with muscle tension or spasms. Ask your health care provider if you should use ice or heat. If directed, put ice on the affected area: If you have a removable brace, remove it as told by your health care provider. Put ice in a plastic bag. Place a towel between your skin and the bag. Leave the ice on for 20 minutes, 2-3 times a day. Remove the ice if your skin turns bright red. This is very important. If you cannot feel pain, heat, or cold, you have a greater risk of damage to the area. If directed, apply heat to the affected area as often as told by your health care provider. Use the heat source that your health care provider recommends, such as a moist heat pack or a heating pad. Place a towel between your skin and the heat source. Leave the heat on for 20-30 minutes. Remove the heat if your skin turns bright red. This is especially important if you are unable to feel pain, heat, or cold. You may have a greater risk of getting burned. General instructions Rest as needed. Return to your normal activities as told by your health care provider. Ask your health care provider what activities are safe for you. Do exercises as told by your health care provider or physical therapist. Keep all follow-up visits. This is important. Contact a health care provider if: Your pain is not controlled with medicine. You have a fever. Your pain is getting worse. Get help right away if: You have weakness, numbness, or tingling in your legs or feet. You lose control of your bladder or bowels. Summary Sacroiliac (SI) joint dysfunction is a condition that causes inflammation on one or both sides of the SI joint. This condition causes deep aching or burning pain in the low back. In some cases,  the pain may also spread into one or both buttocks, hips, or thighs. Treatment depends on the cause and severity of your condition. It may include medicines to reduce pain and swelling or to relax muscles. This information is not intended to replace advice given to you by your health care provider. Make sure you discuss any questions you have with your health care provider. Document Revised: 07/21/2019 Document Reviewed: 07/21/2019 Elsevier Patient Education  2023 [employer].

## 2022-07-31 ENCOUNTER — Telehealth: Payer: Self-pay

## 2022-07-31 ENCOUNTER — Other Ambulatory Visit: Payer: Self-pay

## 2022-07-31 DIAGNOSIS — Z1211 Encounter for screening for malignant neoplasm of colon: Secondary | ICD-10-CM

## 2022-07-31 MED ORDER — NA SULFATE-K SULFATE-MG SULF 17.5-3.13-1.6 GM/177ML PO SOLN: 1.0000 | Freq: Once | ORAL | 0 refills | Status: AC

## 2022-07-31 NOTE — Addendum Note (Signed)
Addended by: [place], Radley Teston on: 07/31/2022 03:41 PM   Modules accepted: Orders

## 2022-07-31 NOTE — Telephone Encounter (Signed)
Gastroenterology Pre-Procedure Review  Request Date: 09/09/22 Requesting Physician: Dr. Allegra Lai  PATIENT REVIEW QUESTIONS: The patient responded to the following health history questions as indicated:    1. Are you having any GI issues? no 2. Do you have a personal history of Polyps? no 3. Do you have a family history of Colon Cancer or Polyps? yes (maternal grandmother colon cancer) 4. Diabetes Mellitus? no 5. Joint replacements in the past 12 months?no 6. Major health problems in the past 3 months?no 7. Any artificial heart valves, MVP, or defibrillator?no    MEDICATIONS & ALLERGIES:    Patient reports the following regarding taking any anticoagulation/antiplatelet therapy:   Plavix, Coumadin, Eliquis, Xarelto, Lovenox, Pradaxa, Brilinta, or Effient? no Aspirin? no  Patient confirms/reports the following medications:  Current Outpatient Medications  Medication Sig Dispense Refill   cyanocobalamin (VITAMIN B12) 1000 MCG tablet Take 1,000 mcg by mouth daily.     cyclobenzaprine (FLEXERIL) 5 MG tablet Take 1 tablet (5 mg total) by mouth 3 (three) times daily as needed for muscle spasms. 30 tablet 2   diclofenac Sodium (VOLTAREN) 1 % GEL Apply 4 g topically 4 (four) times daily. 100 g 3   DULoxetine (CYMBALTA) 30 MG capsule Take 1 capsule (30 mg total) by mouth daily for 7 days, THEN increase to 2 capsules (60 mg total) daily. 90 capsule 3   gabapentin (NEURONTIN) 100 MG capsule Take 1 capsule (100 mg total) by mouth 3 (three) times daily. 90 capsule 3   HYDROcodone-acetaminophen (NORCO/VICODIN) 5-325 MG tablet Take 1 tablet by mouth every 4 (four) hours as needed. 15 tablet 0   hydrOXYzine (ATARAX) 10 MG tablet Take 1-2 tablets (10-20 mg total) by mouth 2 (two) times daily as needed for anxiety. 60 tablet 1   meloxicam (MOBIC) 7.5 MG tablet Take 1 tablet (7.5 mg total) by mouth daily as needed for pain. Take with prilosec. 90 tablet 3   omeprazole (PRILOSEC) 20 MG capsule Take 1 capsule  (20 mg total) by mouth daily. Take 30 minutes to 1 hour before breakfast 90 capsule 3   predniSONE (DELTASONE) 20 MG tablet Take 2 tablets (40 mg total) by mouth daily with breakfast for 5 days. 10 tablet 0   propranolol (INDERAL) 10 MG tablet Take 1 tablet (10 mg total) by mouth 2 (two) times daily as needed (anxiety). 120 tablet 1   rosuvastatin (CRESTOR) 10 MG tablet Take 1 tablet (10 mg total) by mouth daily. 90 tablet 3   traMADol (ULTRAM) 50 MG tablet Take 1 tablet (50 mg total) by mouth every 12 (twelve) hours as needed for up to 6 days. 12 tablet 0   No current facility-administered medications for this visit.    Patient confirms/reports the following allergies:  Allergies  Allergen Reactions   Sulfa Antibiotics Rash    No orders of the defined types were placed in this encounter.   AUTHORIZATION INFORMATION Primary Insurance: 1D#: Group #:  Secondary Insurance: 1D#: Group #:  SCHEDULE INFORMATION: Date: 09/09/22 Time: Location: [facility]

## 2022-08-01 ENCOUNTER — Other Ambulatory Visit: Payer: Self-pay

## 2022-08-04 NOTE — Telephone Encounter (Signed)
Results are in chart now.

## 2022-08-05 ENCOUNTER — Other Ambulatory Visit: Payer: Self-pay

## 2022-08-05 ENCOUNTER — Ambulatory Visit (INDEPENDENT_AMBULATORY_CARE_PROVIDER_SITE_OTHER): Payer: BC Managed Care – PPO

## 2022-08-05 DIAGNOSIS — E538 Deficiency of other specified B group vitamins: Secondary | ICD-10-CM | POA: Diagnosis not present

## 2022-08-05 MED ORDER — CYANOCOBALAMIN 1000 MCG/ML IJ SOLN
1000.0000 ug | Freq: Once | INTRAMUSCULAR | Status: AC
Start: 2022-08-05 — End: 2022-08-05
  Administered 2022-08-05: 1000 ug via INTRAMUSCULAR

## 2022-08-05 NOTE — Progress Notes (Signed)
Patient presented for B 12 injection to left deltoid, patient voiced no concerns nor showed any signs of distress during injection. 

## 2022-08-20 ENCOUNTER — Ambulatory Visit: Payer: BC Managed Care – PPO | Admitting: Family

## 2022-09-02 ENCOUNTER — Ambulatory Visit: Payer: BC Managed Care – PPO | Admitting: Family

## 2022-09-08 ENCOUNTER — Ambulatory Visit: Payer: BC Managed Care – PPO

## 2022-09-08 ENCOUNTER — Telehealth: Payer: Self-pay

## 2022-09-08 NOTE — Telephone Encounter (Signed)
Patient did not have transportation to get to her colonoscopy.  She has canceled.  Thanks,  [place], [place]

## 2022-09-09 ENCOUNTER — Encounter: Admission: RE | Payer: Self-pay | Source: Home / Self Care

## 2022-09-09 ENCOUNTER — Ambulatory Visit
Admission: RE | Admit: 2022-09-09 | Payer: BC Managed Care – PPO | Source: Home / Self Care | Admitting: Gastroenterology

## 2022-09-09 SURGERY — COLONOSCOPY WITH PROPOFOL
Anesthesia: General

## 2022-09-10 ENCOUNTER — Ambulatory Visit: Payer: BC Managed Care – PPO | Admitting: Family

## 2022-09-12 ENCOUNTER — Encounter: Payer: Self-pay | Admitting: Family

## 2022-09-12 ENCOUNTER — Ambulatory Visit (INDEPENDENT_AMBULATORY_CARE_PROVIDER_SITE_OTHER): Payer: BC Managed Care – PPO | Admitting: Family

## 2022-09-12 VITALS — BP 130/86 | HR 82 | Ht 64.0 in | Wt 207.8 lb

## 2022-09-12 DIAGNOSIS — M545 Low back pain, unspecified: Secondary | ICD-10-CM | POA: Diagnosis not present

## 2022-09-12 DIAGNOSIS — F411 Generalized anxiety disorder: Secondary | ICD-10-CM

## 2022-09-12 DIAGNOSIS — I700 Atherosclerosis of aorta: Secondary | ICD-10-CM | POA: Diagnosis not present

## 2022-09-12 MED ORDER — BUSPIRONE HCL 5 MG PO TABS
5.0000 mg | ORAL_TABLET | Freq: Three times a day (TID) | ORAL | 3 refills | Status: DC | PRN
Start: 2022-09-12 — End: 2023-02-10

## 2022-09-12 MED ORDER — FLUOXETINE HCL 20 MG PO CAPS
20.0000 mg | ORAL_CAPSULE | Freq: Every morning | ORAL | 3 refills | Status: DC
Start: 2022-09-12 — End: 2022-10-17

## 2022-09-12 NOTE — Assessment & Plan Note (Signed)
Completely resolved at this time.  Upcoming appointment with rheumatology.  Will follow

## 2022-09-12 NOTE — Patient Instructions (Addendum)
Start trial of Prozac.  I have also sent in BuSpar for you to start as well.  I placed a referral to psychiatry  Let us know if you dont hear back within a week in regards to an appointment being scheduled.   So that you are aware, if you are [facility] MyChart user , please pay attention to your MyChart messages as you may receive a MyChart message with a phone number to call and schedule this test/appointment own your own from our referral coordinator. This is a new process so I do not want you to miss this message.  If you are not a MyChart user, you will receive a phone call.

## 2022-09-12 NOTE — Progress Notes (Signed)
Assessment & Plan:  GAD (generalized anxiety disorder) Assessment & Plan: Uncontrolled.  Start Prozac 20 mg daily.  Trial of BuSpar 5 mg 3 times daily as needed.  Referral to psychiatry for evaluation.   Orders: -     busPIRone HCl; Take 1 tablet (5 mg total) by mouth 3 (three) times daily as needed.  Dispense: 90 tablet; Refill: 3 -     FLUoxetine HCl; Take 1 capsule (20 mg total) by mouth every morning.  Dispense: 90 capsule; Refill: 3 -     Ambulatory referral to Psychiatry  Atherosclerosis of aorta (HCC) -     Comprehensive metabolic panel; Future  Acute left-sided low back pain without sciatica Assessment & Plan: Completely resolved at this time.  Upcoming appointment with rheumatology.  Will follow      Return precautions given.   Risks, benefits, and alternatives of the medications and treatment plan prescribed today were discussed, and patient expressed understanding.   Education regarding symptom management and diagnosis given to patient on AVS either electronically or printed.  Return in about 2 months (around 11/12/2022).  Rennie Plowman, FNP  Subjective:    Patient ID: Nicole Escobar, female    DOB: November 20, 1975, 47 y.o.   MRN: [id number]  CC: Nicole Escobar is a 47 y.o. female who presents today for follow up.   HPI: Bilateral hand numbness improved on B12 injections.  She would like B12 injection today.   Hip pain has resolved  Started Crestor at previous visit and she is compliant   Cymbalta has not been helpful for anxiety and she foggy, since stopped.   She continues to have panic attacks at work.  She has had anxiety since she was in high school.  She has tried Zoloft in the past which was not effective for her.  She been on Prozac which was effective  Appointment with rheumatology 12/04/22   Allergies: Sulfa antibiotics Current Outpatient Medications on File Prior to Visit  Medication Sig Dispense Refill   gabapentin (NEURONTIN) 100 MG capsule Take  1 capsule (100 mg total) by mouth 3 (three) times daily. 90 capsule 3   cyanocobalamin (VITAMIN B12) 1000 MCG tablet Take 1,000 mcg by mouth daily. (Patient not taking: Reported on 09/12/2022)     HYDROcodone-acetaminophen (NORCO/VICODIN) 5-325 MG tablet Take 1 tablet by mouth every 4 (four) hours as needed. (Patient not taking: Reported on 09/12/2022) 15 tablet 0   rosuvastatin (CRESTOR) 10 MG tablet Take 1 tablet (10 mg total) by mouth daily. (Patient not taking: Reported on 09/12/2022) 90 tablet 3   No current facility-administered medications on file prior to visit.    Review of Systems  Constitutional:  Negative for chills and fever.  Respiratory:  Negative for cough.   Cardiovascular:  Negative for chest pain and palpitations.  Gastrointestinal:  Negative for nausea and vomiting.  Psychiatric/Behavioral:  The patient is nervous/anxious.       Objective:    BP 130/86   Pulse 82   Ht 5\' 4"  (1.626 m)   Wt 207 lb 12.8 oz (94.3 kg)   SpO2 98%   BMI 35.67 kg/m  BP Readings from Last 3 Encounters:  09/12/22 130/86  07/29/22 138/88  07/28/22 (!) 148/92   Wt Readings from Last 3 Encounters:  09/12/22 207 lb 12.8 oz (94.3 kg)  07/29/22 205 lb 9.6 oz (93.3 kg)  07/25/22 210 lb (95.3 kg)    Physical Exam Vitals reviewed.  Constitutional:      Appearance:  She is well-developed.  Eyes:     Conjunctiva/sclera: Conjunctivae normal.  Cardiovascular:     Rate and Rhythm: Normal rate and regular rhythm.     Pulses: Normal pulses.     Heart sounds: Normal heart sounds.  Pulmonary:     Effort: Pulmonary effort is normal.     Breath sounds: Normal breath sounds. No wheezing, rhonchi or rales.  Skin:    General: Skin is warm and dry.  Neurological:     Mental Status: She is alert.  Psychiatric:        Speech: Speech normal.        Behavior: Behavior normal.        Thought Content: Thought content normal.

## 2022-09-12 NOTE — Assessment & Plan Note (Signed)
Uncontrolled.  Start Prozac 20 mg daily.  Trial of BuSpar 5 mg 3 times daily as needed.  Referral to psychiatry for evaluation.

## 2022-09-15 ENCOUNTER — Ambulatory Visit (INDEPENDENT_AMBULATORY_CARE_PROVIDER_SITE_OTHER): Payer: BC Managed Care – PPO | Admitting: Family

## 2022-09-15 VITALS — BP 130/70 | HR 92 | Temp 97.8°F | Resp 16 | Ht 64.0 in | Wt 201.0 lb

## 2022-09-15 DIAGNOSIS — J029 Acute pharyngitis, unspecified: Secondary | ICD-10-CM

## 2022-09-15 DIAGNOSIS — J40 Bronchitis, not specified as acute or chronic: Secondary | ICD-10-CM

## 2022-09-15 LAB — POCT RAPID STREP A (OFFICE): Rapid Strep A Screen: NEGATIVE

## 2022-09-15 LAB — POC COVID19 BINAXNOW: SARS Coronavirus 2 Ag: NEGATIVE

## 2022-09-15 LAB — POCT INFLUENZA A/B
Influenza A, POC: NEGATIVE
Influenza B, POC: NEGATIVE

## 2022-09-15 MED ORDER — AZITHROMYCIN 250 MG PO TABS
ORAL_TABLET | ORAL | 0 refills | Status: AC
Start: 2022-09-15 — End: 2022-09-20

## 2022-09-15 MED ORDER — BENZONATATE 100 MG PO CAPS
100.0000 mg | ORAL_CAPSULE | Freq: Three times a day (TID) | ORAL | 1 refills | Status: DC | PRN
Start: 2022-09-15 — End: 2022-10-17

## 2022-09-15 MED ORDER — ALBUTEROL SULFATE HFA 108 (90 BASE) MCG/ACT IN AERS
2.0000 | INHALATION_SPRAY | Freq: Four times a day (QID) | RESPIRATORY_TRACT | 0 refills | Status: DC | PRN
Start: 2022-09-15 — End: 2023-11-05

## 2022-09-15 MED ORDER — PREDNISONE 10 MG PO TABS
ORAL_TABLET | ORAL | 0 refills | Status: DC
Start: 2022-09-15 — End: 2022-09-22

## 2022-09-15 NOTE — Assessment & Plan Note (Signed)
No acute respiratory distress.  Negative strep, flu and COVID.  former smoker.  Patient on duration of symptoms, suspect viral bronchitis.  Start prednisone, albuterol inhaler.  I did provide patient with zpak to start if symptoms do not improve.

## 2022-09-15 NOTE — Patient Instructions (Addendum)
Start prednisone.  I would advise starting tomorrow and take all doses prior to noon as prednisone can interfere with sleep.    Start albuterol inhaler  Use albuterol every 6 hours for first 24 hours to get good medication into the lungs and loosen congestion; after, you may use as needed and eventually stop all together when cough resolves.  You may use Tessalon pearls for cough  If symptoms do not improve, you may then start azithromycin (antibiotic).Ensure to take probiotics while on antibiotics and also for 2 weeks after completion. This can either be by eating yogurt daily or taking a probiotic supplement over the counter such as Culturelle.It is important to re-colonize the gut with good bacteria and also to prevent any diarrheal infections associated with antibiotic use.

## 2022-09-15 NOTE — Progress Notes (Signed)
Assessment & Plan:  Bronchitis Assessment & Plan: No acute respiratory distress.  Negative strep, flu and COVID.  former smoker.  Patient on duration of symptoms, suspect viral bronchitis.  Start prednisone, albuterol inhaler.  I did provide patient with zpak to start if symptoms do not improve.  Orders: -     Albuterol Sulfate HFA; Inhale 2 puffs into the lungs every 6 (six) hours as needed for wheezing or shortness of breath.  Dispense: 8 g; Refill: 0 -     predniSONE; Take 40 mg by mouth on day 1, then taper 10 mg daily until gone  Dispense: 10 tablet; Refill: 0 -     Azithromycin; Take 2 tablets on day 1, then 1 tablet daily on days 2 through 5  Dispense: 6 tablet; Refill: 0 -     Benzonatate; Take 1 capsule (100 mg total) by mouth 3 (three) times daily as needed for cough.  Dispense: 20 capsule; Refill: 1  Sore throat -     POC COVID-19 BinaxNow -     POCT rapid strep A -     POCT Influenza A/B     Return precautions given.   Risks, benefits, and alternatives of the medications and treatment plan prescribed today were discussed, and patient expressed understanding.   Education regarding symptom management and diagnosis given to patient on AVS either electronically or printed.  No follow-ups on file.  Rennie Plowman, FNP  Subjective:    Patient ID: Nicole Escobar, female    DOB: 1976/01/24, 47 y.o.   MRN: [id number]  CC: Nicole Escobar is a 47 y.o. female who presents today for an acute visit.    HPI: Complains of bodyaches x 3 days. She is feeling better today.  Endorses productive cough, sore throat, chills, change in taste  No wheezing, sob, cp, sinus pain, ear pain.   Fever yesterday, 100F  Covid test yesterday was negative  She has been taking tylenol PM  She has used albuterol in the past.    H/o tonsillectomy     History of emphysema Quit smoking in 2005  Allergies: Sulfa antibiotics Current Outpatient Medications on File Prior to Visit   Medication Sig Dispense Refill   busPIRone (BUSPAR) 5 MG tablet Take 1 tablet (5 mg total) by mouth 3 (three) times daily as needed. 90 tablet 3   cyanocobalamin (VITAMIN B12) 1000 MCG tablet Take 1,000 mcg by mouth daily. (Patient not taking: Reported on 09/12/2022)     FLUoxetine (PROZAC) 20 MG capsule Take 1 capsule (20 mg total) by mouth every morning. 90 capsule 3   gabapentin (NEURONTIN) 100 MG capsule Take 1 capsule (100 mg total) by mouth 3 (three) times daily. 90 capsule 3   HYDROcodone-acetaminophen (NORCO/VICODIN) 5-325 MG tablet Take 1 tablet by mouth every 4 (four) hours as needed. (Patient not taking: Reported on 09/12/2022) 15 tablet 0   rosuvastatin (CRESTOR) 10 MG tablet Take 1 tablet (10 mg total) by mouth daily. (Patient not taking: Reported on 09/12/2022) 90 tablet 3   No current facility-administered medications on file prior to visit.    Review of Systems  Constitutional:  Positive for chills. Negative for fever.  HENT:  Positive for congestion. Negative for sinus pressure and sore throat.   Respiratory:  Positive for cough. Negative for shortness of breath and wheezing.   Cardiovascular:  Negative for chest pain and palpitations.  Gastrointestinal:  Negative for nausea and vomiting.      Objective:  BP 130/70   Pulse 92   Temp 97.8 F (36.6 C)   Resp 16   Ht 5\' 4"  (1.626 m)   Wt 201 lb (91.2 kg)   SpO2 98%   BMI 34.50 kg/m   BP Readings from Last 3 Encounters:  09/15/22 130/70  09/12/22 130/86  07/29/22 138/88   Wt Readings from Last 3 Encounters:  09/15/22 201 lb (91.2 kg)  09/12/22 207 lb 12.8 oz (94.3 kg)  07/29/22 205 lb 9.6 oz (93.3 kg)    Physical Exam Vitals reviewed.  Constitutional:      Appearance: She is well-developed.  HENT:     Head: Normocephalic and atraumatic.     Right Ear: Hearing, tympanic membrane, ear canal and external ear normal. No decreased hearing noted. No drainage, swelling or tenderness. No middle ear effusion. No  foreign body. Tympanic membrane is not erythematous or bulging.     Left Ear: Hearing, tympanic membrane, ear canal and external ear normal. No decreased hearing noted. No drainage, swelling or tenderness.  No middle ear effusion. No foreign body. Tympanic membrane is not erythematous or bulging.     Nose: Nose normal. No rhinorrhea.     Right Sinus: No maxillary sinus tenderness or frontal sinus tenderness.     Left Sinus: No maxillary sinus tenderness or frontal sinus tenderness.     Mouth/Throat:     Pharynx: Uvula midline. Posterior oropharyngeal erythema present. No oropharyngeal exudate.     Comments: Tonsils absent Eyes:     Conjunctiva/sclera: Conjunctivae normal.  Cardiovascular:     Rate and Rhythm: Regular rhythm.     Pulses: Normal pulses.     Heart sounds: Normal heart sounds.  Pulmonary:     Effort: Pulmonary effort is normal.     Breath sounds: Normal breath sounds. No wheezing, rhonchi or rales.  Lymphadenopathy:     Head:     Right side of head: No submental, submandibular, tonsillar, preauricular, posterior auricular or occipital adenopathy.     Left side of head: No submental, submandibular, tonsillar, preauricular, posterior auricular or occipital adenopathy.     Cervical: No cervical adenopathy.  Skin:    General: Skin is warm and dry.  Neurological:     Mental Status: She is alert.  Psychiatric:        Speech: Speech normal.        Behavior: Behavior normal.        Thought Content: Thought content normal.

## 2022-09-16 ENCOUNTER — Other Ambulatory Visit: Payer: BC Managed Care – PPO

## 2022-09-22 ENCOUNTER — Encounter: Payer: Self-pay | Admitting: Family

## 2022-09-22 ENCOUNTER — Telehealth: Payer: Self-pay

## 2022-09-22 ENCOUNTER — Ambulatory Visit (INDEPENDENT_AMBULATORY_CARE_PROVIDER_SITE_OTHER): Payer: BC Managed Care – PPO | Admitting: Family

## 2022-09-22 VITALS — BP 138/84 | HR 83 | Temp 97.5°F | Ht 63.0 in | Wt 206.2 lb

## 2022-09-22 DIAGNOSIS — J40 Bronchitis, not specified as acute or chronic: Secondary | ICD-10-CM | POA: Diagnosis not present

## 2022-09-22 DIAGNOSIS — R051 Acute cough: Secondary | ICD-10-CM

## 2022-09-22 LAB — POCT RAPID STREP A (OFFICE): Rapid Strep A Screen: NEGATIVE

## 2022-09-22 MED ORDER — AMOXICILLIN-POT CLAVULANATE 875-125 MG PO TABS
1.0000 | ORAL_TABLET | Freq: Two times a day (BID) | ORAL | 0 refills | Status: AC
Start: 2022-09-22 — End: 2022-09-29

## 2022-09-22 MED ORDER — PREDNISONE 10 MG PO TABS: ORAL_TABLET | ORAL | 0 refills | Status: DC

## 2022-09-22 NOTE — Assessment & Plan Note (Signed)
No acute respiratory distress. Fever has resolved. Strep negative. Persistent cough, congestion, wheezing. Start augmentin, prednisone. Patient declines CXR to r/o PNA as she would like to first start augmentin , prednisone. She will let me know how she is doing.

## 2022-09-22 NOTE — Patient Instructions (Signed)
Start prednisone in the morning  Start augmentin Ensure to take probiotics while on antibiotics and also for 2 weeks after completion. This can either be by eating yogurt daily or taking a probiotic supplement over the counter such as Culturelle.It is important to re-colonize the gut with good bacteria and also to prevent any diarrheal infections associated with antibiotic use.    Let me know how you are doing

## 2022-09-22 NOTE — Progress Notes (Signed)
Assessment & Plan:  Bronchitis Assessment & Plan: No acute respiratory distress. Fever has resolved. Strep negative. Persistent cough, congestion, wheezing. Start augmentin, prednisone. Patient declines CXR to r/o PNA as she would like to first start augmentin , prednisone. She will let me know how she is doing.   Orders: -     predniSONE; Take 4 tablets ( total 40 mg) by mouth for 2 days; take 3 tablets ( total 30 mg) by mouth for 2 days; take 2 tablets ( total 20 mg) by mouth for 1 day; take 1 tablet ( total 10 mg) by mouth for 1 day.  Dispense: 17 tablet; Refill: 0 -     Amoxicillin-Pot Clavulanate; Take 1 tablet by mouth 2 (two) times daily for 7 days.  Dispense: 14 tablet; Refill: 0  Acute cough -     POCT rapid strep A     Return precautions given.   Risks, benefits, and alternatives of the medications and treatment plan prescribed today were discussed, and patient expressed understanding.   Education regarding symptom management and diagnosis given to patient on AVS either electronically or printed.  No follow-ups on file.  Rennie Plowman, FNP  Subjective:    Patient ID: Nicole Escobar, female    DOB: 01/25/1976, 47 y.o.   MRN: [id number]  CC: Nicole Escobar is a 47 y.o. female who presents today for an acute visit.    HPI: Complains of continued congestion which is thick and green x 7 days, unchanged.   Endorses painful swollen lymph nodes particularlt on the right side.  Endorses wheezing. She felt better on prednisone.  She completed the zpak without improvement   Fever has resolved. No sob, cp, trouble swallowing.   Her son has been sick as well   She was unable to start inhaler due to cost Former smoker  Allergies: Sulfa antibiotics Current Outpatient Medications on File Prior to Visit  Medication Sig Dispense Refill   albuterol (VENTOLIN HFA) 108 (90 Base) MCG/ACT inhaler Inhale 2 puffs into the lungs every 6 (six) hours as needed for wheezing or  shortness of breath. 8 g 0   benzonatate (TESSALON) 100 MG capsule Take 1 capsule (100 mg total) by mouth 3 (three) times daily as needed for cough. 20 capsule 1   busPIRone (BUSPAR) 5 MG tablet Take 1 tablet (5 mg total) by mouth 3 (three) times daily as needed. 90 tablet 3   FLUoxetine (PROZAC) 20 MG capsule Take 1 capsule (20 mg total) by mouth every morning. 90 capsule 3   gabapentin (NEURONTIN) 100 MG capsule Take 1 capsule (100 mg total) by mouth 3 (three) times daily. 90 capsule 3   HYDROcodone-acetaminophen (NORCO/VICODIN) 5-325 MG tablet Take 1 tablet by mouth every 4 (four) hours as needed. 15 tablet 0   rosuvastatin (CRESTOR) 10 MG tablet Take 1 tablet (10 mg total) by mouth daily. 90 tablet 3   cyanocobalamin (VITAMIN B12) 1000 MCG tablet Take 1,000 mcg by mouth daily. (Patient not taking: Reported on 09/22/2022)     No current facility-administered medications on file prior to visit.    Review of Systems    Objective:    BP 138/84   Pulse 83   Temp (!) 97.5 F (36.4 C) (Oral)   Ht 5\' 3"  (1.6 m)   Wt 206 lb 3.2 oz (93.5 kg)   SpO2 97%   BMI 36.53 kg/m   BP Readings from Last 3 Encounters:  09/22/22 138/84  09/15/22 130/70  09/12/22 130/86   Wt Readings from Last 3 Encounters:  09/22/22 206 lb 3.2 oz (93.5 kg)  09/15/22 201 lb (91.2 kg)  09/12/22 207 lb 12.8 oz (94.3 kg)    Physical Exam Vitals reviewed.  Constitutional:      Appearance: She is well-developed.  HENT:     Head: Normocephalic and atraumatic.     Right Ear: Hearing, tympanic membrane, ear canal and external ear normal. No decreased hearing noted. No drainage, swelling or tenderness. No middle ear effusion. No foreign body. Tympanic membrane is not erythematous or bulging.     Left Ear: Hearing, tympanic membrane, ear canal and external ear normal. No decreased hearing noted. No drainage, swelling or tenderness.  No middle ear effusion. No foreign body. Tympanic membrane is not erythematous or  bulging.     Nose: Nose normal. No rhinorrhea.     Right Sinus: No maxillary sinus tenderness or frontal sinus tenderness.     Left Sinus: No maxillary sinus tenderness or frontal sinus tenderness.     Mouth/Throat:     Pharynx: Uvula midline. Posterior oropharyngeal erythema present. No oropharyngeal exudate.     Comments: Tonsils absent Eyes:     Conjunctiva/sclera: Conjunctivae normal.  Cardiovascular:     Rate and Rhythm: Regular rhythm.     Pulses: Normal pulses.     Heart sounds: Normal heart sounds.  Pulmonary:     Effort: Pulmonary effort is normal.     Breath sounds: Examination of the right-upper field reveals wheezing. Examination of the left-upper field reveals wheezing. Wheezing present. No rhonchi or rales.  Lymphadenopathy:     Head:     Right side of head: No submental, submandibular, tonsillar, preauricular, posterior auricular or occipital adenopathy.     Left side of head: No submental, submandibular, tonsillar, preauricular, posterior auricular or occipital adenopathy.     Cervical: No cervical adenopathy.  Skin:    General: Skin is warm and dry.  Neurological:     Mental Status: She is alert.  Psychiatric:        Speech: Speech normal.        Behavior: Behavior normal.        Thought Content: Thought content normal.

## 2022-09-22 NOTE — Telephone Encounter (Signed)
Patient sent [place] a message stating, "Still from Monday last week throat red and I took all meds".

## 2022-09-23 NOTE — Telephone Encounter (Signed)
Pt was seen in office on 09/22/22

## 2022-10-13 ENCOUNTER — Ambulatory Visit: Payer: BC Managed Care – PPO

## 2022-10-17 ENCOUNTER — Encounter: Payer: Self-pay | Admitting: Family

## 2022-10-17 ENCOUNTER — Ambulatory Visit (INDEPENDENT_AMBULATORY_CARE_PROVIDER_SITE_OTHER): Payer: BC Managed Care – PPO

## 2022-10-17 ENCOUNTER — Ambulatory Visit (INDEPENDENT_AMBULATORY_CARE_PROVIDER_SITE_OTHER): Payer: BC Managed Care – PPO | Admitting: Family

## 2022-10-17 VITALS — BP 130/76 | HR 81 | Temp 97.5°F | Ht 64.0 in | Wt 205.8 lb

## 2022-10-17 DIAGNOSIS — J40 Bronchitis, not specified as acute or chronic: Secondary | ICD-10-CM | POA: Diagnosis not present

## 2022-10-17 DIAGNOSIS — M545 Low back pain, unspecified: Secondary | ICD-10-CM

## 2022-10-17 DIAGNOSIS — R059 Cough, unspecified: Secondary | ICD-10-CM | POA: Diagnosis not present

## 2022-10-17 DIAGNOSIS — E538 Deficiency of other specified B group vitamins: Secondary | ICD-10-CM | POA: Diagnosis not present

## 2022-10-17 MED ORDER — BUDESONIDE-FORMOTEROL FUMARATE 80-4.5 MCG/ACT IN AERO
2.0000 | INHALATION_SPRAY | Freq: Two times a day (BID) | RESPIRATORY_TRACT | 12 refills | Status: DC
Start: 2022-10-17 — End: 2023-11-24

## 2022-10-17 MED ORDER — CYANOCOBALAMIN 1000 MCG/ML IJ SOLN
1000.0000 ug | Freq: Once | INTRAMUSCULAR | Status: AC
Start: 2022-10-17 — End: 2022-10-17
  Administered 2022-10-17: 1000 ug via INTRAMUSCULAR

## 2022-10-17 MED ORDER — GUAIFENESIN ER 600 MG PO TB12
1200.0000 mg | ORAL_TABLET | Freq: Two times a day (BID) | ORAL | 1 refills | Status: DC | PRN
Start: 2022-10-17 — End: 2023-02-10

## 2022-10-17 MED ORDER — GABAPENTIN 100 MG PO CAPS
100.0000 mg | ORAL_CAPSULE | Freq: Every day | ORAL | Status: DC
Start: 2022-10-17 — End: 2022-11-26

## 2022-10-17 NOTE — Assessment & Plan Note (Signed)
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  She like to continue B12 IM monthly indefinitely.  B12 given today.  Patient will schedule ongoing B12 RN visits monthly

## 2022-10-17 NOTE — Patient Instructions (Signed)
Trial of Mucinex with plenty water to see if we can break up thick phlegm. Please pick up albuterol inhaler and also Symbicort.  Albuterol is intended to be rescue therapy to be used as needed for wheezing, coughing.  It will also help loosen phlegm. For maintenance therapy, I would recommend use of Symbicort which has a low-dose of steroid and it.  I recommend using it for a few weeks at least deceiving get the cough completely resolved.  It may be that you do not need indefinitely.  You may just need Symbicort when you have an upper respiratory infection  Nice to see you

## 2022-10-17 NOTE — Progress Notes (Signed)
Assessment & Plan:  Bronchitis Assessment & Plan: No acute respiratory distress.  Improvement overall.  Question of COPD exacerbation versus bronchitis.   we discussed use of Symbicort, albuterol and Mucinex.  Pending chest x-ray to evaluate for PNA and need for further antibiotics.  If symptoms persist, will likely start Z-Pak and/or prednisone taper.   Orders: -     DG Chest 2 View; Future -     Budesonide-Formoterol Fumarate; Inhale 2 puffs into the lungs 2 (two) times daily.  Dispense: 1 each; Refill: 12 -     Gabapentin; Take 1 capsule (100 mg total) by mouth daily. -     guaiFENesin ER; Take 2 tablets (1,200 mg total) by mouth 2 (two) times daily as needed for cough or to loosen phlegm.  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  She like to continue B12 IM monthly indefinitely.  B12 given today.  Patient will schedule ongoing B12 RN visits monthly  Orders: -     Cyanocobalamin     Return precautions given.   Risks, benefits, and alternatives of the medications and treatment plan prescribed today were discussed, and patient expressed understanding.   Education regarding symptom management and diagnosis given to patient on AVS either electronically or printed.  Return in about 3 months (around 01/17/2023).  Rennie Plowman, FNP  Subjective:    Patient ID: Nicole Escobar, female    DOB: 09-09-1975, 47 y.o.   MRN: [id number]  CC: TANETTE ARMADA is a 47 y.o. female who presents today for follow up.   HPI: Follow-up bronchitis, previously seen 25 days ago and started on Augmentin, prednisone taper x 7 days  She has sporacid productive cough, improved.   Endorses PND, wheezing at night when supine  No fever, SOB, facial swelling, teeth pain, ear pain, sore throat, leg swelling  She is not taking any medication    Insurance didn't cover  albuterol inhaler   B12 has been helpful for neuropathy in hands. She would like b12 IM today and to continue B12 IM injections monthly    Former smoker   Allergies: Sulfa antibiotics Current Outpatient Medications on File Prior to Visit  Medication Sig Dispense Refill   albuterol (VENTOLIN HFA) 108 (90 Base) MCG/ACT inhaler Inhale 2 puffs into the lungs every 6 (six) hours as needed for wheezing or shortness of breath. 8 g 0   busPIRone (BUSPAR) 5 MG tablet Take 1 tablet (5 mg total) by mouth 3 (three) times daily as needed. 90 tablet 3   cyanocobalamin (VITAMIN B12) 1000 MCG tablet Take 1,000 mcg by mouth daily. (Patient not taking: Reported on 09/22/2022)     HYDROcodone-acetaminophen (NORCO/VICODIN) 5-325 MG tablet Take 1 tablet by mouth every 4 (four) hours as needed. 15 tablet 0   rosuvastatin (CRESTOR) 10 MG tablet Take 1 tablet (10 mg total) by mouth daily. 90 tablet 3   No current facility-administered medications on file prior to visit.    Review of Systems  Constitutional:  Negative for chills and fever.  HENT:  Positive for congestion and postnasal drip. Negative for sinus pressure, sinus pain and sore throat.   Respiratory:  Positive for cough and wheezing. Negative for shortness of breath.   Cardiovascular:  Negative for chest pain and palpitations.  Gastrointestinal:  Negative for nausea and vomiting.      Objective:    BP  130/76   Pulse 81   Temp (!) 97.5 F (36.4 C) (Oral)   Ht 5\' 4"  (1.626 m)   Wt 205 lb 12.8 oz (93.4 kg)   LMP  (LMP Unknown)   SpO2 98%   BMI 35.33 kg/m  BP Readings from Last 3 Encounters:  10/17/22 130/76  09/22/22 138/84  09/15/22 130/70   Wt Readings from Last 3 Encounters:  10/17/22 205 lb 12.8 oz (93.4 kg)  09/22/22 206 lb 3.2 oz (93.5 kg)  09/15/22 201 lb (91.2 kg)    Physical Exam Vitals reviewed.  Constitutional:      Appearance: She is well-developed.  HENT:     Head: Normocephalic and atraumatic.     Right Ear:  Hearing, tympanic membrane, ear canal and external ear normal. No decreased hearing noted. No drainage, swelling or tenderness. No middle ear effusion. No foreign body. Tympanic membrane is not erythematous or bulging.     Left Ear: Hearing, tympanic membrane, ear canal and external ear normal. No decreased hearing noted. No drainage, swelling or tenderness.  No middle ear effusion. No foreign body. Tympanic membrane is not erythematous or bulging.     Nose: Nose normal. No rhinorrhea.     Right Sinus: No maxillary sinus tenderness or frontal sinus tenderness.     Left Sinus: No maxillary sinus tenderness or frontal sinus tenderness.     Mouth/Throat:     Pharynx: Uvula midline. No oropharyngeal exudate or posterior oropharyngeal erythema.     Tonsils: No tonsillar abscesses.  Eyes:     Conjunctiva/sclera: Conjunctivae normal.  Cardiovascular:     Rate and Rhythm: Regular rhythm.     Pulses: Normal pulses.     Heart sounds: Normal heart sounds.  Pulmonary:     Effort: Pulmonary effort is normal.     Breath sounds: Normal breath sounds. No wheezing, rhonchi or rales.     Comments: Slight expiratory wheeze heard bilateral upper lung field when asked to force cough. Lymphadenopathy:     Head:     Right side of head: No submental, submandibular, tonsillar, preauricular, posterior auricular or occipital adenopathy.     Left side of head: No submental, submandibular, tonsillar, preauricular, posterior auricular or occipital adenopathy.     Cervical: No cervical adenopathy.  Skin:    General: Skin is warm and dry.  Neurological:     Mental Status: She is alert.  Psychiatric:        Speech: Speech normal.        Behavior: Behavior normal.        Thought Content: Thought content normal.

## 2022-10-17 NOTE — Assessment & Plan Note (Signed)
No acute respiratory distress.  Improvement overall.  Question of COPD exacerbation versus bronchitis.   we discussed use of Symbicort, albuterol and Mucinex.  Pending chest x-ray to evaluate for PNA and need for further antibiotics.  If symptoms persist, will likely start Z-Pak and/or prednisone taper.

## 2022-10-22 ENCOUNTER — Encounter (INDEPENDENT_AMBULATORY_CARE_PROVIDER_SITE_OTHER): Payer: Self-pay

## 2022-11-05 ENCOUNTER — Encounter: Payer: Self-pay | Admitting: Family

## 2022-11-07 ENCOUNTER — Ambulatory Visit: Payer: BC Managed Care – PPO | Admitting: Family

## 2022-11-17 ENCOUNTER — Ambulatory Visit: Payer: BC Managed Care – PPO

## 2022-11-17 NOTE — Progress Notes (Deleted)
Pt presented for their vitamin B12 injection. Pt was identified through two identifiers. Pt tolerated shot well in their left or right deltoid.  

## 2022-11-26 ENCOUNTER — Telehealth: Payer: Self-pay

## 2022-11-26 ENCOUNTER — Other Ambulatory Visit: Payer: Self-pay | Admitting: Family

## 2022-11-26 DIAGNOSIS — J40 Bronchitis, not specified as acute or chronic: Secondary | ICD-10-CM

## 2022-11-26 MED ORDER — GABAPENTIN 100 MG PO CAPS
100.0000 mg | ORAL_CAPSULE | Freq: Every day | ORAL | 1 refills | Status: DC
Start: 2022-11-26 — End: 2023-02-10

## 2022-11-26 NOTE — Telephone Encounter (Signed)
Prescription Request  11/26/2022  LOV: Visit date not found  What is the name of the medication or equipment? gabapentin (NEURONTIN) 100 MG capsule  Have you contacted your pharmacy to request a refill? Yes   Which pharmacy would you like this sent to?  [facility] [address] [place] - [address] [place] [address] Phone: [phone] Fax: [phone]   Patient notified that their request is being sent to the clinical staff for review and that they should receive a response within 2 business days.   Please advise at Mobile [phone] (mobile)  Patient states she is out of this medication.  Patient states she thought she had another refill left and her MyChart is down.

## 2022-11-26 NOTE — Telephone Encounter (Signed)
Rx sent in to pharmacy pt is aware 

## 2022-11-28 ENCOUNTER — Ambulatory Visit (INDEPENDENT_AMBULATORY_CARE_PROVIDER_SITE_OTHER): Payer: BC Managed Care – PPO

## 2022-11-28 DIAGNOSIS — E538 Deficiency of other specified B group vitamins: Secondary | ICD-10-CM | POA: Diagnosis not present

## 2022-11-28 MED ORDER — CYANOCOBALAMIN 1000 MCG/ML IJ SOLN
1000.0000 ug | Freq: Once | INTRAMUSCULAR | Status: AC
Start: 2022-11-28 — End: 2022-11-28
  Administered 2022-11-28: 1000 ug via INTRAMUSCULAR

## 2022-11-28 NOTE — Progress Notes (Signed)
Per orders of Guillermina City, FNP , injection of B-12 given by Vertis Kelch in left deltoid. Patient tolerated injection well.
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
## 2022-12-04 ENCOUNTER — Encounter: Payer: Self-pay | Admitting: Internal Medicine

## 2022-12-04 ENCOUNTER — Ambulatory Visit: Payer: BC Managed Care – PPO

## 2022-12-04 ENCOUNTER — Ambulatory Visit: Payer: BC Managed Care – PPO | Attending: Internal Medicine | Admitting: Internal Medicine

## 2022-12-04 VITALS — BP 149/91 | HR 76 | Resp 14 | Ht 64.5 in | Wt 205.0 lb

## 2022-12-04 DIAGNOSIS — M79642 Pain in left hand: Secondary | ICD-10-CM | POA: Diagnosis not present

## 2022-12-04 DIAGNOSIS — M2550 Pain in unspecified joint: Secondary | ICD-10-CM | POA: Diagnosis not present

## 2022-12-04 DIAGNOSIS — M25562 Pain in left knee: Secondary | ICD-10-CM | POA: Diagnosis not present

## 2022-12-04 DIAGNOSIS — G8929 Other chronic pain: Secondary | ICD-10-CM

## 2022-12-04 DIAGNOSIS — M25561 Pain in right knee: Secondary | ICD-10-CM | POA: Diagnosis not present

## 2022-12-04 DIAGNOSIS — M79641 Pain in right hand: Secondary | ICD-10-CM

## 2022-12-04 DIAGNOSIS — R768 Other specified abnormal immunological findings in serum: Secondary | ICD-10-CM

## 2022-12-04 NOTE — Patient Instructions (Signed)
For osteoarthritis several treatments may be beneficial:  - Topical antiinflammatory medicine such as diclofenac or Voltaren can be applied to  affected area as needed. Topical analgesics containing CBD, menthol, or lidocaine can be tried.  - Oral nonsteroidal antiinflammatory drugs (NSAIDs) such as ibuprofen, aleve, celebrex, or mobic are usually helpful for osteoarthritis. These should be taken intermittently or as needed, and always taken with food.  - Turmeric has some antiinflammatory effect similar to NSAIDs and may help, if taken as a supplement should not be taken above recommended doses.   - Compressive gloves or sleeve can be helpful to support the joint especially if hurting or swelling with certain activities.  - Local steroid injection is an option if symptoms are not well controlled

## 2022-12-05 LAB — SJOGRENS SYNDROME-A EXTRACTABLE NUCLEAR ANTIBODY: SSA (Ro) (ENA) Antibody, IgG: <1 AI

## 2022-12-05 LAB — C3 AND C4
C3 Complement: 177 mg/dL (ref 83–193)
C4 Complement: 26 mg/dL (ref 15–57)

## 2022-12-05 LAB — RNP ANTIBODY: Ribonucleic Protein(ENA) Antibody, IgG: <1 AI

## 2022-12-05 LAB — SJOGRENS SYNDROME-B EXTRACTABLE NUCLEAR ANTIBODY: SSB (La) (ENA) Antibody, IgG: <1 AI

## 2022-12-05 LAB — ANTI-DNA ANTIBODY, DOUBLE-STRANDED: ds DNA Ab: 1 [IU]/mL

## 2022-12-05 LAB — ANTI-SMITH ANTIBODY: ENA SM Ab Ser-aCnc: <1 AI

## 2022-12-25 ENCOUNTER — Ambulatory Visit: Payer: BC Managed Care – PPO | Admitting: Internal Medicine
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
## 2022-12-29 ENCOUNTER — Ambulatory Visit: Payer: BC Managed Care – PPO

## 2023-01-15 ENCOUNTER — Other Ambulatory Visit (INDEPENDENT_AMBULATORY_CARE_PROVIDER_SITE_OTHER): Payer: BC Managed Care – PPO

## 2023-01-15 ENCOUNTER — Ambulatory Visit: Payer: BC Managed Care – PPO

## 2023-01-15 DIAGNOSIS — I700 Atherosclerosis of aorta: Secondary | ICD-10-CM | POA: Diagnosis not present

## 2023-01-15 DIAGNOSIS — I10 Essential (primary) hypertension: Secondary | ICD-10-CM

## 2023-01-15 DIAGNOSIS — E538 Deficiency of other specified B group vitamins: Secondary | ICD-10-CM

## 2023-01-15 MED ORDER — CYANOCOBALAMIN 1000 MCG/ML IJ SOLN
1000.0000 ug | Freq: Once | INTRAMUSCULAR | Status: AC
Start: 2023-01-15 — End: 2023-01-15
  Administered 2023-01-15: 1000 ug via INTRAMUSCULAR

## 2023-01-15 NOTE — Progress Notes (Signed)
After obtaining consent, and per orders of  Margaret Arnett, NP, injection of B-12 given IM in right deltoid by ,  Lynn. Patient tolerated injection well.  

## 2023-01-16 LAB — COMPREHENSIVE METABOLIC PANEL
ALT: 18 U/L (ref 0–35)
AST: 20 U/L (ref 0–37)
Albumin: 4 g/dL (ref 3.5–5.2)
Alkaline Phosphatase: 54 U/L (ref 39–117)
BUN: 9 mg/dL (ref 6–23)
CO2: 25 meq/L (ref 19–32)
Calcium: 8.9 mg/dL (ref 8.4–10.5)
Chloride: 105 meq/L (ref 96–112)
Creatinine, Ser: 0.75 mg/dL (ref 0.40–1.20)
GFR: 94.81 mL/min (ref >60.00)
Glucose, Bld: 118 mg/dL — ABNORMAL HIGH (ref 70–99)
Potassium: 3.6 meq/L (ref 3.5–5.1)
Sodium: 140 meq/L (ref 135–145)
Total Bilirubin: 0.4 mg/dL (ref 0.2–1.2)
Total Protein: 6.6 g/dL (ref 6.0–8.3)

## 2023-01-16 LAB — LIPID PANEL
Cholesterol: 153 mg/dL (ref 0–200)
HDL: 41.4 mg/dL (ref >39.00)
LDL Cholesterol: 81 mg/dL (ref 0–99)
NonHDL: 112.08
Total CHOL/HDL Ratio: 4
Triglycerides: 154 mg/dL — ABNORMAL HIGH (ref 0.0–149.0)
VLDL: 30.8 mg/dL (ref 0.0–40.0)

## 2023-01-19 ENCOUNTER — Ambulatory Visit: Payer: BC Managed Care – PPO | Admitting: Family

## 2023-01-21 ENCOUNTER — Telehealth: Payer: Self-pay | Admitting: Family

## 2023-01-21 NOTE — Telephone Encounter (Signed)
Patient called. She just had her B12 injection last week. She would like to have her b12 levels checked. She doesn't even feel like she got a b12 injection last week.

## 2023-01-21 NOTE — Telephone Encounter (Signed)
Pt scheduled appt for 01/27/23

## 2023-01-26 ENCOUNTER — Ambulatory Visit: Payer: BC Managed Care – PPO | Admitting: Internal Medicine
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
## 2023-01-27 ENCOUNTER — Telehealth: Payer: Self-pay | Admitting: *Deleted

## 2023-01-27 ENCOUNTER — Ambulatory Visit: Payer: BC Managed Care – PPO | Admitting: Family

## 2023-01-27 NOTE — Telephone Encounter (Signed)
Patient has appt 11/19 need to document flu? Or postpone? Please see if can close colonoscopy/ cologuard gap.

## 2023-02-03 ENCOUNTER — Ambulatory Visit: Payer: BC Managed Care – PPO | Admitting: Family

## 2023-02-10 ENCOUNTER — Ambulatory Visit (INDEPENDENT_AMBULATORY_CARE_PROVIDER_SITE_OTHER): Payer: BC Managed Care – PPO | Admitting: Family

## 2023-02-10 VITALS — BP 130/78 | HR 67 | Temp 97.6°F | Ht 64.0 in | Wt 211.0 lb

## 2023-02-10 DIAGNOSIS — M2550 Pain in unspecified joint: Secondary | ICD-10-CM

## 2023-02-10 DIAGNOSIS — E538 Deficiency of other specified B group vitamins: Secondary | ICD-10-CM

## 2023-02-10 DIAGNOSIS — Z0100 Encounter for examination of eyes and vision without abnormal findings: Secondary | ICD-10-CM

## 2023-02-10 DIAGNOSIS — J40 Bronchitis, not specified as acute or chronic: Secondary | ICD-10-CM

## 2023-02-10 LAB — B12 AND FOLATE PANEL
Folate: 7 ng/mL (ref >5.9)
Vitamin B-12: 227 pg/mL (ref 211–911)

## 2023-02-10 MED ORDER — CYANOCOBALAMIN 1000 MCG/ML IJ SOLN
1000.0000 ug | Freq: Once | INTRAMUSCULAR | Status: AC
  Administered 2023-02-10: 1000 ug via INTRAMUSCULAR

## 2023-02-10 MED ORDER — B-12 1000 MCG SL SUBL
1.0000 | SUBLINGUAL_TABLET | Freq: Every day | SUBLINGUAL | 3 refills | Status: DC
Start: 2023-02-10 — End: 2023-12-09

## 2023-02-10 MED ORDER — GABAPENTIN 100 MG PO CAPS: 100.0000 mg | ORAL_CAPSULE | Freq: Three times a day (TID) | ORAL | 3 refills | Status: DC | PRN

## 2023-02-10 NOTE — Assessment & Plan Note (Addendum)
Reviewed Dr. Gregary Cromer note, rheumatology.  Low suspicion for autoimmune connective tissue disease.  Patient is doing well on gabapentin 100 mg daily.  There are intervals in which she takes gabapentin 300 mg TID,  and I have changed prescription to reflect this.  B12 IM given today.  Advised she may continue monthly B12 IM indefinitely as long as she is deriving therapeutic benefit.

## 2023-02-10 NOTE — Assessment & Plan Note (Signed)
Concern for CTS.  Advised icing and braces at bedtime.  If symptoms become more persistent, will recommend consult with Dr. Stephenie Acres to avoid permanent weakness, numbness.

## 2023-02-10 NOTE — Addendum Note (Signed)
Addended by: [place], Geraldin Habermehl on: 02/10/2023 02:44 PM   Modules accepted: Orders

## 2023-02-10 NOTE — Assessment & Plan Note (Signed)
Lab Results  Component Value Date   LDLCALC 81 01/15/2023   Excellent control of cholesterol.  Continue Crestor 10 mg daily

## 2023-02-10 NOTE — Patient Instructions (Addendum)
You may also trial melatonin combination over the counter supplement such as Sleep#3 or Qunol Sleep 5 in 1   Cervical Strain and Sprain Rehab Ask your health care provider which exercises are safe for you. Do exercises exactly as told by your health care provider and adjust them as directed. It is normal to feel mild stretching, pulling, tightness, or discomfort as you do these exercises. Stop right away if you feel sudden pain or your pain gets worse. Do not begin these exercises until told by your health care provider. Stretching and range-of-motion exercises Cervical side bending  Using good posture, sit on a stable chair or stand up. Without moving your shoulders, slowly tilt your left / right ear to your shoulder until you feel a stretch in the neck muscles on the opposite side. You should be looking straight ahead. Hold for __________ seconds. Repeat with the other side of your neck. Repeat __________ times. Complete this exercise __________ times a day. Cervical rotation  Using good posture, sit on a stable chair or stand up. Slowly turn your head to the side as if you are looking over your left / right shoulder. Keep your eyes level with the ground. Stop when you feel a stretch along the side and the back of your neck. Hold for __________ seconds. Repeat this by turning to your other side. Repeat __________ times. Complete this exercise __________ times a day. Thoracic extension and pectoral stretch  Roll a towel or a small blanket so it is about 4 inches (10 cm) in diameter. Lie down on your back on a firm surface. Put the towel in the middle of your back across your spine. It should not be under your shoulder blades. Put your hands behind your head and let your elbows fall out to your sides. Hold for __________ seconds. Repeat __________ times. Complete this exercise __________ times a day. Strengthening exercises Upper cervical flexion  Lie on your back with a thin  pillow behind your head or a small, rolled-up towel under your neck. Gently tuck your chin toward your chest and nod your head down to look toward your feet. Do not lift your head off the pillow. Hold for __________ seconds. Release the tension slowly. Relax your neck muscles completely before you repeat this exercise. Repeat __________ times. Complete this exercise __________ times a day. Cervical extension  Stand about 6 inches (15 cm) away from a wall, with your back facing the wall. Place a soft object, about 6-8 inches (15-20 cm) in diameter, between the back of your head and the wall. A soft object could be a small pillow, a ball, or a folded towel. Gently tilt your head back and press into the soft object. Keep your jaw and forehead relaxed. Hold for __________ seconds. Release the tension slowly. Relax your neck muscles completely before you repeat this exercise. Repeat __________ times. Complete this exercise __________ times a day. Posture and body mechanics Body mechanics refer to the movements and positions of your body while you do your daily activities. Posture is part of body mechanics. Good posture and healthy body mechanics can help to relieve stress in your body's tissues and joints. Good posture means that your spine is in its natural S-curve position (your spine is neutral), your shoulders are pulled back slightly, and your head is not tipped forward. The following are general guidelines for using improved posture and body mechanics in your everyday activities. Sitting  When sitting, keep your spine neutral and keep  your feet flat on the floor. Use a footrest, if needed, and keep your thighs parallel to the floor. Avoid rounding your shoulders. Avoid tilting your head forward. When working at a desk or a computer, keep your desk at a height where your hands are slightly lower than your elbows. Slide your chair under your desk so you are close enough to maintain good  posture. When working at a computer, place your monitor at a height where you are looking straight ahead and you do not have to tilt your head forward or downward to look at the screen. Standing  When standing, keep your spine neutral and keep your feet about hip-width apart. Keep a slight bend in your knees. Your ears, shoulders, and hips should line up. When you do a task in which you stand in one place for a long time, place one foot up on a stable object that is 2-4 inches (5-10 cm) high, such as a footstool. This helps keep your spine neutral. Resting When lying down and resting, avoid positions that are most painful for you. Try to support your neck in a neutral position. You can use a contour pillow or a small rolled-up towel. Your pillow should support your neck but not push on it. This information is not intended to replace advice given to you by your health care provider. Make sure you discuss any questions you have with your health care provider. Document Revised: 07/14/2022 Document Reviewed: 09/30/2021 Elsevier Patient Education  2024 [employer].

## 2023-02-10 NOTE — Assessment & Plan Note (Signed)
Modest symptom improvement.  Continue B12 injection monthly.  Also advise she may start over-the-counter B12 1000 mcg sublingual.

## 2023-02-10 NOTE — Progress Notes (Signed)
Assessment & Plan:  B12 deficiency Assessment & Plan: Modest symptom improvement.  Continue B12 injection monthly.  Also advise she may start over-the-counter B12 1000 mcg sublingual.   Orders: -     Gabapentin; Take 1 capsule (100 mg total) by mouth 3 (three) times daily as needed.  Dispense: 270 capsule; Refill: 3 -     B12 and Folate Panel -     B-12; Place 1 tablet under the tongue daily.  Dispense: 90 tablet; Refill: 3  Bronchitis  Polyarthralgia Assessment & Plan: Reviewed Dr. Gregary Cromer note, rheumatology.  Low suspicion for autoimmune connective tissue disease.  Patient is doing well on gabapentin 100 mg daily.  There are intervals in which she takes gabapentin 300 mg TID,  and I have changed prescription to reflect this.  B12 IM given today.  Advised she may continue monthly B12 IM indefinitely as long as she is deriving therapeutic benefit.    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  Advised icing and braces at bedtime.  If symptoms become more persistent, will recommend consult with Dr. Stephenie Acres to avoid permanent weakness, numbness.   Return precautions given.   Risks, benefits, and alternatives of the medications and treatment plan prescribed today were discussed, and patient expressed understanding.   Education regarding symptom management and diagnosis given to patient on AVS either electronically or printed.  Return in about 6 months (around 08/10/2023).  Rennie Plowman, FNP  Subjective:    Patient ID: Nicole Escobar, female    DOB: January 02, 1976, 47 y.o.   MRN: [id number]  CC: Nicole Escobar is a 47 y.o. female who presents today for follow up.   HPI: Overall feels well today.  Here to discuss medications.  She has been overall pleased with gabapentin 100 mg daily however there are days when she takes gabapentin 3 times daily as needed for joint pain.  Knee pain is overall improved.  She is yet to schedule follow-up  with Dr. Dimple Casey although she intends to do so   Compliant with Crestor 10 mg daily  She is getting monthly b12 injections.  Continues to have episodic bilateral entire hand numbness, improved on b12 injections.  About 3 weeks after injection she does feel B12 "wearing off".   More noticeable when sleeping in her hands are bent or when driving.  Denies numbness coming down bilateral arms.  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   Allergies: Sulfa antibiotics Current Outpatient Medications on File Prior to Visit  Medication Sig Dispense Refill   albuterol (VENTOLIN HFA) 108 (90 Base) MCG/ACT inhaler Inhale 2 puffs into the lungs every 6 (six) hours as needed for wheezing or shortness of breath. 8 g 0   budesonide-formoterol (SYMBICORT) 80-4.5 MCG/ACT inhaler Inhale 2 puffs into the lungs 2 (two) times daily. 1 each 12   Ibuprofen 200 MG CAPS      rosuvastatin (CRESTOR) 10 MG tablet Take 1 tablet (10 mg total) by mouth daily. 90 tablet 3   No current facility-administered medications on file prior to visit.    Review of Systems  Constitutional:  Negative for chills and fever.  Respiratory:  Negative for cough.   Cardiovascular:  Negative for chest pain and palpitations.  Gastrointestinal:  Negative for nausea and vomiting.  Musculoskeletal:  Positive for arthralgias.  Neurological:  Positive for numbness.  Objective:    BP 130/78   Pulse 67   Temp 97.6 F (36.4 C) (Oral)   Ht 5\' 4"  (1.626 m)   Wt 211 lb (95.7 kg)   SpO2 98%   BMI 36.22 kg/m  BP Readings from Last 3 Encounters:  02/10/23 130/78  12/04/22 (!) 149/91  10/17/22 130/76   Wt Readings from Last 3 Encounters:  02/10/23 211 lb (95.7 kg)  12/04/22 205 lb (93 kg)  10/17/22 205 lb 12.8 oz (93.4 kg)    Physical Exam Vitals reviewed.  Constitutional:      Appearance: She is well-developed.  Eyes:     Conjunctiva/sclera: Conjunctivae normal.  Neck:    Cardiovascular:     Rate and Rhythm: Normal rate and regular rhythm.     Pulses: Normal pulses.     Heart sounds: Normal heart sounds.  Pulmonary:     Effort: Pulmonary effort is normal.     Breath sounds: Normal breath sounds. No wheezing, rhonchi or rales.  Musculoskeletal:     Right wrist: No swelling, deformity or tenderness.     Left wrist: No swelling, deformity or tenderness.     Cervical back: No torticollis. Muscular tenderness present. No pain with movement or spinous process tenderness.  Skin:    General: Skin is warm and dry.  Neurological:     Mental Status: She is alert.  Psychiatric:        Speech: Speech normal.        Behavior: Behavior normal.        Thought Content: Thought content normal.

## 2023-03-10 ENCOUNTER — Ambulatory Visit (INDEPENDENT_AMBULATORY_CARE_PROVIDER_SITE_OTHER): Payer: BC Managed Care – PPO

## 2023-03-10 DIAGNOSIS — E538 Deficiency of other specified B group vitamins: Secondary | ICD-10-CM | POA: Diagnosis not present

## 2023-03-10 MED ORDER — CYANOCOBALAMIN 1000 MCG/ML IJ SOLN
1000.0000 ug | Freq: Once | INTRAMUSCULAR | Status: AC
  Administered 2023-03-10: 1000 ug via INTRAMUSCULAR

## 2023-03-10 NOTE — Progress Notes (Signed)
Patient presented for B 12 injection to left deltoid, patient voiced no concerns nor showed any signs of distress during injection. 

## 2023-04-10 ENCOUNTER — Ambulatory Visit (INDEPENDENT_AMBULATORY_CARE_PROVIDER_SITE_OTHER): Payer: BC Managed Care – PPO

## 2023-04-10 DIAGNOSIS — E538 Deficiency of other specified B group vitamins: Secondary | ICD-10-CM

## 2023-04-10 MED ORDER — CYANOCOBALAMIN 1000 MCG/ML IJ SOLN
1000.0000 ug | Freq: Once | INTRAMUSCULAR | Status: AC
  Administered 2023-04-10: 1000 ug via INTRAMUSCULAR

## 2023-04-10 NOTE — Progress Notes (Signed)
Patient presented for B 12 injection to right deltoid, patient voiced no concerns nor showed any signs of distress during injection. 

## 2023-05-06 ENCOUNTER — Ambulatory Visit (INDEPENDENT_AMBULATORY_CARE_PROVIDER_SITE_OTHER): Payer: BC Managed Care – PPO | Admitting: Family

## 2023-05-06 ENCOUNTER — Encounter: Payer: Self-pay | Admitting: Family

## 2023-05-06 ENCOUNTER — Ambulatory Visit
Admission: RE | Admit: 2023-05-06 | Discharge: 2023-05-06 | Disposition: A | Discharge status: Home / Self Care | Payer: BC Managed Care – PPO | Source: Ambulatory Visit | Attending: Family | Admitting: Family

## 2023-05-06 VITALS — BP 139/88 | HR 87 | Temp 97.9°F | Ht 64.0 in | Wt 214.6 lb

## 2023-05-06 DIAGNOSIS — M79605 Pain in left leg: Secondary | ICD-10-CM

## 2023-05-06 DIAGNOSIS — E538 Deficiency of other specified B group vitamins: Secondary | ICD-10-CM

## 2023-05-06 DIAGNOSIS — M79604 Pain in right leg: Secondary | ICD-10-CM

## 2023-05-06 DIAGNOSIS — M79662 Pain in left lower leg: Secondary | ICD-10-CM | POA: Diagnosis not present

## 2023-05-06 LAB — B12 AND FOLATE PANEL
Folate: 10.7 ng/mL (ref >5.9)
Vitamin B-12: >1537 pg/mL — ABNORMAL HIGH (ref 211–911)

## 2023-05-06 LAB — URIC ACID: Uric Acid, Serum: 4 mg/dL (ref 2.4–7.0)

## 2023-05-06 MED ORDER — GABAPENTIN 100 MG PO CAPS: 200.0000 mg | ORAL_CAPSULE | Freq: Three times a day (TID) | ORAL | 3 refills | Status: DC

## 2023-05-06 NOTE — Assessment & Plan Note (Signed)
B12 administered today.  Patient prefers to stay on B12 injections monthly versus transitioning to oral

## 2023-05-06 NOTE — Progress Notes (Signed)
Assessment & Plan:  Pain in both lower extremities Assessment & Plan: Etiology unclear.  Question of peripheral neuropathy is aggravating.  Suspect hematoma of the left leg although no known injury.  Question if lipoma.  Reviewed previous ultrasounds from 2020.  pending venous duplex, ABI, labs.  Increase gabapentin to 200 mg 3 times daily.  Encouraged compression stockings.    Orders: -     Uric acid -     US ARTERIAL ABI (SCREENING LOWER EXTREMITY); Future -     Gabapentin; Take 2 capsules (200 mg total) by mouth 3 (three) times daily.  Dispense: 540 capsule; Refill: 3 -     B12 and Folate Panel -     US Venous Img Lower Bilateral (DVT); Future  B12 deficiency Assessment & Plan: B12 administered today.  Patient prefers to stay on B12 injections monthly versus transitioning to oral  Orders: -     Gabapentin; Take 2 capsules (200 mg total) by mouth 3 (three) times daily.  Dispense: 540 capsule; Refill: 3 -     B12 and Folate Panel     Return precautions given.   Risks, benefits, and alternatives of the medications and treatment plan prescribed today were discussed, and patient expressed understanding.   Education regarding symptom management and diagnosis given to patient on AVS either electronically or printed.  Return in about 1 month (around 06/03/2023).  Rennie Plowman, FNP  Subjective:    Patient ID: Janese Banks, female    DOB: 18-Dec-1975, 48 y.o.   MRN: [id number]  CC: JILL STOPKA is a 48 y.o. female who presents today for an acute visit.    HPI: Complains of left heel pain in the arch 6 days ago, resolved 4 days ago.  She then developed calf pain going up her leg.     She has noticed a sore 'knot' left frontal  leg x 3 days ago  Describes as ache, burning.  It will be exquisite at times.  She finds herself rubbing the area.  There is a small discoloration which is a bruise however she does not recall any injury.  She is taking ibuprofen on occasion however  not daily  If palpated , pain is worse.     Bilateral hip pain and bilateral leg pain, x 5 days ago, subsided now.      Compliant gabapentin 100 mg 3 times daily Lumbar XR mild degenerative changes.  Preservation of vertebral body height 11/2022 Left knee x-ray mild medial compartment osteoarthritis; right knee x-ray moderate knee arthritis BL Venous duplex 07/11/2021 negative DVT  B12 injections 04/10/23;she feels is helping with energy.   12/31/2018  US DVT 1. No evidence of DVT within the right lower extremity. 2. Minimal amount of subcutaneous edema correlates with the patient's area of pain and swelling involving the dorsal aspect of the foot without definable/drainable fluid collection.   Allergies: Sulfa antibiotics Current Outpatient Medications on File Prior to Visit  Medication Sig Dispense Refill   albuterol (VENTOLIN HFA) 108 (90 Base) MCG/ACT inhaler Inhale 2 puffs into the lungs every 6 (six) hours as needed for wheezing or shortness of breath. 8 g 0   budesonide-formoterol (SYMBICORT) 80-4.5 MCG/ACT inhaler Inhale 2 puffs into the lungs 2 (two) times daily. 1 each 12   Cyanocobalamin (B-12) 1000 MCG SUBL Place 1 tablet under the tongue daily. 90 tablet 3   Ibuprofen 200 MG CAPS      rosuvastatin (CRESTOR) 10 MG tablet Take 1 tablet (10  mg total) by mouth daily. 90 tablet 3   No current facility-administered medications on file prior to visit.    Review of Systems  Constitutional:  Negative for chills and fever.  Respiratory:  Negative for cough.   Cardiovascular:  Negative for chest pain, palpitations and leg swelling.  Gastrointestinal:  Negative for nausea and vomiting.  Musculoskeletal:  Positive for arthralgias.  Skin:  Positive for color change. Negative for rash and wound.  Neurological:  Positive for numbness.      Objective:    BP 139/88   Pulse 87   Temp 97.9 F (36.6 C) (Oral)   Ht 5\' 4"  (1.626 m)   Wt 214 lb 9.6 oz (97.3 kg)   LMP  (LMP  Unknown)   SpO2 97%   BMI 36.84 kg/m   BP Readings from Last 3 Encounters:  05/06/23 139/88  02/10/23 130/78  12/04/22 (!) 149/91   Wt Readings from Last 3 Encounters:  05/06/23 214 lb 9.6 oz (97.3 kg)  02/10/23 211 lb (95.7 kg)  12/04/22 205 lb (93 kg)    Physical Exam Vitals reviewed.  Constitutional:      Appearance: She is well-developed.  Eyes:     Conjunctiva/sclera: Conjunctivae normal.  Cardiovascular:     Rate and Rhythm: Normal rate and regular rhythm.     Pulses: Normal pulses.     Heart sounds: Normal heart sounds.     Comments: No LE edema, palpable cords or masses. No erythema or increased warmth. No asymmetry in calf size when compared bilaterally LE hair  is not present on LLE.  No varicosities noted. LE warm.  Diminished pedal pulses bilaterally  Pulmonary:     Effort: Pulmonary effort is normal.     Breath sounds: Normal breath sounds. No wheezing, rhonchi or rales.  Musculoskeletal:     Right lower leg: No edema.     Left lower leg: No edema.  Skin:    General: Skin is warm and dry.  Neurological:     Mental Status: She is alert.  Psychiatric:        Speech: Speech normal.        Behavior: Behavior normal.        Thought Content: Thought content normal.    Tenderness and ecchymosis approximately 2 cm left calf.

## 2023-05-06 NOTE — Patient Instructions (Signed)
Etiology unclear.  I have ordered ultrasound of venous and arterial system.  trial compression stockings.   increase gabapentin to 200 mg 3 times daily.

## 2023-05-06 NOTE — Assessment & Plan Note (Signed)
Etiology unclear.  Question of peripheral neuropathy is aggravating.  Suspect hematoma of the left leg although no known injury.  Question if lipoma.  Reviewed previous ultrasounds from 2020.  pending venous duplex, ABI, labs.  Increase gabapentin to 200 mg 3 times daily.  Encouraged compression stockings.

## 2023-05-07 ENCOUNTER — Encounter: Payer: Self-pay | Admitting: Family

## 2023-05-07 ENCOUNTER — Other Ambulatory Visit: Payer: Self-pay | Admitting: Family

## 2023-05-07 DIAGNOSIS — M79605 Pain in left leg: Secondary | ICD-10-CM

## 2023-05-08 ENCOUNTER — Telehealth: Payer: Self-pay | Admitting: Family

## 2023-05-08 NOTE — Telephone Encounter (Signed)
Lft pt vm to call ofc to sch [place]. thanks ?

## 2023-05-11 ENCOUNTER — Ambulatory Visit: Payer: BC Managed Care – PPO

## 2023-05-20 ENCOUNTER — Ambulatory Visit: Payer: BC Managed Care – PPO

## 2023-05-20 ENCOUNTER — Ambulatory Visit
Admission: RE | Admit: 2023-05-20 | Discharge: 2023-05-20 | Disposition: A | Discharge status: Home / Self Care | Payer: BC Managed Care – PPO | Source: Ambulatory Visit | Attending: Family | Admitting: Family

## 2023-05-20 DIAGNOSIS — M79662 Pain in left lower leg: Secondary | ICD-10-CM | POA: Diagnosis not present

## 2023-05-20 DIAGNOSIS — M79604 Pain in right leg: Secondary | ICD-10-CM | POA: Diagnosis not present

## 2023-05-20 DIAGNOSIS — I10 Essential (primary) hypertension: Secondary | ICD-10-CM | POA: Diagnosis not present

## 2023-05-20 DIAGNOSIS — M79605 Pain in left leg: Secondary | ICD-10-CM | POA: Diagnosis not present

## 2023-05-20 DIAGNOSIS — E785 Hyperlipidemia, unspecified: Secondary | ICD-10-CM | POA: Diagnosis not present

## 2023-05-20 DIAGNOSIS — I739 Peripheral vascular disease, unspecified: Secondary | ICD-10-CM | POA: Diagnosis not present

## 2023-05-24 ENCOUNTER — Encounter: Payer: Self-pay | Admitting: Family

## 2023-05-24 ENCOUNTER — Other Ambulatory Visit: Payer: Self-pay | Admitting: Family

## 2023-05-24 DIAGNOSIS — L0291 Cutaneous abscess, unspecified: Secondary | ICD-10-CM

## 2023-05-24 MED ORDER — DOXYCYCLINE HYCLATE 100 MG PO TABS: 100.0000 mg | ORAL_TABLET | Freq: Two times a day (BID) | ORAL | 0 refills | Status: AC

## 2023-05-26 ENCOUNTER — Other Ambulatory Visit: Payer: Self-pay | Admitting: Family

## 2023-05-26 DIAGNOSIS — M79605 Pain in left leg: Secondary | ICD-10-CM

## 2023-05-27 ENCOUNTER — Ambulatory Visit: Admitting: Family

## 2023-05-27 NOTE — Telephone Encounter (Signed)
 Spoke to pt she meant to say Pharmacy and that the rx needs to be changed altogether in order for the insurance to fill it

## 2023-05-28 ENCOUNTER — Other Ambulatory Visit: Payer: Self-pay | Admitting: Family

## 2023-05-28 NOTE — Telephone Encounter (Signed)
 Spoke to pharmacist and she stated that they were able to refill medication and pt picked up on 05/26/23

## 2023-06-01 ENCOUNTER — Ambulatory Visit (INDEPENDENT_AMBULATORY_CARE_PROVIDER_SITE_OTHER): Payer: Self-pay | Admitting: Psychiatry

## 2023-06-01 ENCOUNTER — Encounter: Payer: Self-pay | Admitting: Psychiatry

## 2023-06-01 VITALS — BP 146/86 | HR 99 | Temp 97.9°F | Ht 64.0 in | Wt 215.8 lb

## 2023-06-01 DIAGNOSIS — F411 Generalized anxiety disorder: Secondary | ICD-10-CM

## 2023-06-01 DIAGNOSIS — F4321 Adjustment disorder with depressed mood: Secondary | ICD-10-CM

## 2023-06-01 DIAGNOSIS — F410 Panic disorder [episodic paroxysmal anxiety] without agoraphobia: Secondary | ICD-10-CM

## 2023-06-01 MED ORDER — HYDROXYZINE HCL 10 MG PO TABS: 10.0000 mg | ORAL_TABLET | Freq: Three times a day (TID) | ORAL | 0 refills | Status: DC | PRN

## 2023-06-01 MED ORDER — TRAZODONE HCL 50 MG PO TABS: 50.0000 mg | ORAL_TABLET | Freq: Every day | ORAL | 0 refills | Status: DC

## 2023-06-01 MED ORDER — VENLAFAXINE HCL ER 37.5 MG PO CP24: ORAL_CAPSULE | ORAL | 0 refills | Status: DC
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
## 2023-06-02 ENCOUNTER — Ambulatory Visit: Admitting: Family

## 2023-06-02 ENCOUNTER — Encounter: Payer: Self-pay | Admitting: Family

## 2023-06-02 VITALS — BP 138/84 | HR 82 | Temp 98.3°F | Ht 64.0 in | Wt 216.8 lb

## 2023-06-02 DIAGNOSIS — M79604 Pain in right leg: Secondary | ICD-10-CM

## 2023-06-02 DIAGNOSIS — M2550 Pain in unspecified joint: Secondary | ICD-10-CM

## 2023-06-02 DIAGNOSIS — E538 Deficiency of other specified B group vitamins: Secondary | ICD-10-CM

## 2023-06-02 DIAGNOSIS — M79605 Pain in left leg: Secondary | ICD-10-CM

## 2023-06-02 MED ORDER — CYANOCOBALAMIN 1000 MCG/ML IJ SOLN
1000.0000 ug | Freq: Once | INTRAMUSCULAR | Status: AC
  Administered 2023-06-02: 1000 ug via INTRAMUSCULAR

## 2023-06-02 MED ORDER — GABAPENTIN 300 MG PO CAPS: 300.0000 mg | ORAL_CAPSULE | Freq: Three times a day (TID) | ORAL | 3 refills | Status: AC

## 2023-06-02 NOTE — Patient Instructions (Signed)
 Increase Gabapentin 300 mg 3 times daily.  Do not increase gabapentin until you have acclimated to new regimen from behavioral health as we discussed.  Nice seeing you!

## 2023-06-02 NOTE — Assessment & Plan Note (Signed)
 Suboptimal control.  Increase gabapentin 300 mg 3 times daily

## 2023-06-02 NOTE — Assessment & Plan Note (Addendum)
 Reproducible left lateral calf tenderness over identified 5 x 3 x 3 mm avascular hypoechoic structure.  Similar soft tissue mass right lower extremity on exam.  I question if lipoma or remote soft tissue injury bilaterally.  Presentation not consistent with early cellulitis or abscess on exam Due to pain, we discussed whether abscess versus hematoma.  We discussed her frequently rubbing the area and advised to stop as I suspect aggravating.  Will address overall pain with increasing gabapentin 300 mg 3 times daily.  I prescribed doxycycline 5 days ago which she hasn't started; she feels more comfortable holding on starting antibiotic for suspected abscess until seen by Dr Aretha Parrot which I think is appropriate.  Appreciate orthopedic insight.

## 2023-06-02 NOTE — Progress Notes (Signed)
 Assessment & Plan:  Polyarthralgia Assessment & Plan: Suboptimal control.  Increase gabapentin 300 mg 3 times daily   B12 deficiency Assessment & Plan: B12 administered today.  Continue monthly B12 injections  Orders: -     Cyanocobalamin  Pain in both lower extremities Assessment & Plan: Reproducible left lateral calf tenderness over identified 5 x 3 x 3 mm avascular hypoechoic structure.  Similar soft tissue mass right lower extremity on exam.  I question if lipoma or remote soft tissue injury bilaterally.  Presentation not consistent with early cellulitis or abscess on exam Due to pain, we discussed whether abscess versus hematoma.  We discussed her frequently rubbing the area and advised to stop as I suspect aggravating.  Will address overall pain with increasing gabapentin 300 mg 3 times daily.  I prescribed doxycycline 5 days ago which she hasn't started; she feels more comfortable holding on starting antibiotic for suspected abscess until seen by Dr Aretha Parrot which I think is appropriate.  Appreciate orthopedic insight.  Orders: -     Gabapentin; Take 1 capsule (300 mg total) by mouth 3 (three) times daily.  Dispense: 270 capsule; Refill: 3     Return precautions given.   Risks, benefits, and alternatives of the medications and treatment plan prescribed today were discussed, and patient expressed understanding.   Education regarding symptom management and diagnosis given to patient on AVS either electronically or printed.  Return in about 2 months (around 08/02/2023).  Rennie Plowman, FNP  Subjective:    Patient ID: Nicole Escobar, female    DOB: [birth year], 48 y.o.   MRN: [id number]  CC: Nicole Escobar is a 48 y.o. female who presents today for follow up.   HPI: Follow-up left lateral calf pain.  Pain has been constant.  She endorses rubbing frequently.  She has been taking gabapentin 200 mg 3 times daily.  This is not overly sedating for her.    She would like her  b12 given today; last given 04/10/23   Left leg Ultrasound 05/20/23 with 5 x 3 x 3 mm avascular hypoechoic structure   She has established care with behavioral health.  Today for panic disorder, complicated grief, Nicole Escobar.    she plans to start Effexor, Atarax and trazodone.  She also was given a list of counselors to call   Allergies: Sulfa antibiotics Current Outpatient Medications on File Prior to Visit  Medication Sig Dispense Refill   albuterol (VENTOLIN HFA) 108 (90 Base) MCG/ACT inhaler Inhale 2 puffs into the lungs every 6 (six) hours as needed for wheezing or shortness of breath. 8 g 0   Cyanocobalamin (B-12) 1000 MCG SUBL Place 1 tablet under the tongue daily. 90 tablet 3   hydrOXYzine (ATARAX) 10 MG tablet Take 1 tablet (10 mg total) by mouth 3 (three) times daily as needed for anxiety. 30 tablet 0   rosuvastatin (CRESTOR) 10 MG tablet Take 1 tablet (10 mg total) by mouth daily. 90 tablet 3   traZODone (DESYREL) 50 MG tablet Take 1 tablet (50 mg total) by mouth at bedtime. 30 tablet 0   venlafaxine XR (EFFEXOR-XR) 37.5 MG 24 hr capsule Start taking Venlafaxine 37.5 mg for week 1, on week 2 increase to 75mg  of Venlafaxine, on week 3 & 4 increase dose to 150 mg. 80 capsule 0   budesonide-formoterol (SYMBICORT) 80-4.5 MCG/ACT inhaler Inhale 2 puffs into the lungs 2 (two) times daily. (Patient not taking: Reported on 06/02/2023) 1 each 12   Ibuprofen 200  MG CAPS  (Patient not taking: Reported on 06/02/2023)     No current facility-administered medications on file prior to visit.    Review of Systems  Constitutional:  Negative for chills and fever.  Respiratory:  Negative for cough.   Cardiovascular:  Negative for chest pain and palpitations.  Gastrointestinal:  Negative for nausea and vomiting.  Musculoskeletal:  Positive for arthralgias.  Psychiatric/Behavioral:  The patient is nervous/anxious.       Objective:    BP 138/84   Pulse 82   Temp 98.3 F (36.8 C) (Oral)    Ht 5\' 4"  (1.626 m)   Wt 216 lb 12.8 oz (98.3 kg)   LMP  (LMP Unknown)   SpO2 97%   BMI 37.21 kg/m  BP Readings from Last 3 Encounters:  06/02/23 138/84  06/01/23 (!) 146/86  05/06/23 139/88   Wt Readings from Last 3 Encounters:  06/02/23 216 lb 12.8 oz (98.3 kg)  06/01/23 215 lb 12.8 oz (97.9 kg)  05/06/23 214 lb 9.6 oz (97.3 kg)    Physical Exam Vitals reviewed.  Constitutional:      Appearance: She is well-developed.  Eyes:     Conjunctiva/sclera: Conjunctivae normal.  Cardiovascular:     Rate and Rhythm: Normal rate and regular rhythm.     Pulses: Normal pulses.     Heart sounds: Normal heart sounds.     Comments: No LE edema, palpable cords.  No erythema or increased warmth. No asymmetry in calf size when compared bilaterally LE hair growth symmetric and present. No discoloration or varicosities noted. LE warm   Pulmonary:     Effort: Pulmonary effort is normal.     Breath sounds: Normal breath sounds. No wheezing, rhonchi or rales.  Musculoskeletal:       Legs:     Comments: Right lateral palpable mass 1-2 cm. Non tender, non fluctuant.  Left lateral tender mass appreciated. Non tender, non fluctuant, without erythema.   Skin:    General: Skin is warm and dry.  Neurological:     Mental Status: She is alert.  Psychiatric:        Speech: Speech normal.        Behavior: Behavior normal.        Thought Content: Thought content normal.

## 2023-06-02 NOTE — Assessment & Plan Note (Signed)
 B12 administered today.  Continue monthly B12 injections

## 2023-06-04 ENCOUNTER — Encounter: Payer: Self-pay | Admitting: Orthopedic Surgery

## 2023-06-04 ENCOUNTER — Ambulatory Visit (INDEPENDENT_AMBULATORY_CARE_PROVIDER_SITE_OTHER): Admitting: Orthopedic Surgery

## 2023-06-04 VITALS — BP 133/85

## 2023-06-04 DIAGNOSIS — M1711 Unilateral primary osteoarthritis, right knee: Secondary | ICD-10-CM

## 2023-06-04 DIAGNOSIS — M1710 Unilateral primary osteoarthritis, unspecified knee: Secondary | ICD-10-CM

## 2023-06-04 DIAGNOSIS — R2242 Localized swelling, mass and lump, left lower limb: Secondary | ICD-10-CM | POA: Diagnosis not present

## 2023-06-04 NOTE — Patient Instructions (Addendum)
 Instructions Following Joint Injections  In clinic today, you received an injection in one of your joints (sometimes more than one).  Occasionally, you can have some pain at the injection site, this is normal.  You can place ice at the injection site, or take over-the-counter medications such as Tylenol (acetaminophen) or Advil (ibuprofen).  Please follow all directions listed on the bottle.  If your joint (knee or shoulder) becomes swollen, red or very painful, please contact the clinic for additional assistance.   Two medications were injected, including lidocaine and a steroid (often referred to as cortisone).  Lidocaine is effective almost immediately but wears off quickly.  However, the steroid can take a few days to improve your symptoms.  In some cases, it can make your pain worse for a couple of days.  Do not be concerned if this happens as it is common.  You can apply ice or take some over-the-counter medications as needed.   Injections in the same joint cannot be repeated for 3 months.  This helps to limit the risk of an infection in the joint.  If you were to develop an infection in your joint, the best treatment option would be surgery.    Instructions  1.  You have sustained an ankle sprain, or similar exercises that can be treated as an ankle sprain.  **These exercises can also be used as part of recovery from an ankle fracture.  2.  I encourage you to stay on your feet and gradually remove your walking boot.   3.  Below are some exercises that you can complete on your own to improve your symptoms.  4.  As an alternative, you can search for ankle sprain exercises online, and can see some demonstrations on YouTube  5.  If you are having difficulty with these exercises, we can also prescribe formal physical therapy  Ankle Exercises Ask your health care provider which exercises are safe for you. Do exercises exactly as told by your health care provider and adjust them as directed. It  is normal to feel mild stretching, pulling, tightness, or mild discomfort as you do these exercises. Stop right away if you feel sudden pain or your pain gets worse. Do not begin these exercises until told by your health care provider.  Stretching and range-of-motion exercises These exercises warm up your muscles and joints and improve the movement and flexibility of your ankle. These exercises may also help to relieve pain.  Dorsiflexion/plantar flexion  Sit with your L knee straight or bent. Do not rest your foot on anything. Flex your left ankle to tilt the top of your foot toward your shin. This is called dorsiflexion. Hold this position for 5 seconds. Point your toes downward to tilt the top of your foot away from your shin. This is called plantar flexion. Hold this position for 5 seconds. Repeat 10 times. Complete this exercise 2-3 times a day.  As tolerated  Ankle alphabet  Sit with your L foot supported at your lower leg. Do not rest your foot on anything. Make sure your foot has room to move freely. Think of your L foot as a paintbrush: Move your foot to trace each letter of the alphabet in the air. Keep your hip and knee still while you trace the letters. Trace every letter from A to Z. Make the letters as large as you can without causing or increasing any discomfort.  Repeat 2-3 times. Complete this exercise 2-3 times a day.  Strengthening exercises These exercises build strength and endurance in your ankle. Endurance is the ability to use your muscles for a long time, even after they get tired. Dorsiflexors These are muscles that lift your foot up. Secure a rubber exercise band or tube to an object, such as a table leg, that will stay still when the band is pulled. Secure the other end around your L foot. Sit on the floor, facing the object with your L leg extended. The band or tube should be slightly tense when your foot is relaxed. Slowly flex your L ankle and toes to  bring your foot toward your shin. Hold this position for 5 seconds. Slowly return your foot to the starting position, controlling the band as you do that. Repeat 10 times. Complete this exercise 2-3 times a day.  Plantar flexors These are muscles that push your foot down. Sit on the floor with your L leg extended. Loop a rubber exercise band or tube around the ball of your L foot. The ball of your foot is on the walking surface, right under your toes. The band or tube should be slightly tense when your foot is relaxed. Slowly point your toes downward, pushing them away from you. Hold this position for 5 seconds. Slowly release the tension in the band or tube, controlling smoothly until your foot is back in the starting position. Repeat 10 times. Complete this exercise 2-3 times a day.  Towel curls  Sit in a chair on a non-carpeted surface, and put your feet on the floor. Place a towel in front of your feet. Keeping your heel on the floor, put your L foot on the towel. Pull the towel toward you by grabbing the towel with your toes and curling them under. Keep your heel on the floor. Let your toes relax. Grab the towel again. Keep pulling the towel until it is completely underneath your foot. Repeat 10 times. Complete this exercise 2-3 times a day.  Standing plantar flexion This is an exercise in which you use your toes to lift your body's weight while standing. Stand with your feet shoulder-width apart. Keep your weight spread evenly over the width of your feet while you rise up on your toes. Use a wall or table to steady yourself if needed, but try not to use it for support. If this exercise is too easy, try these options: Shift your weight toward your L leg until you feel challenged. If told by your health care provider, lift your uninjured leg off the floor. Hold this position for 5 seconds. Repeat 10 times. Complete this exercise 2-3 times a day.  Tandem walking Stand with one  foot directly in front of the other. Slowly raise your back foot up, lifting your heel before your toes, and place it directly in front of your other foot. Continue to walk in this heel-to-toe way. Have a countertop or wall nearby to use if needed to keep your balance, but try not to hold onto anything for support.  Repeat 10 times. Complete this exercise 2-3 times a day.

## 2023-06-04 NOTE — Addendum Note (Signed)
 Addended by: Cherre Huger E on: 06/04/2023 02:48 PM   Modules accepted: Orders

## 2023-06-04 NOTE — Progress Notes (Signed)
 New Patient Visit  Assessment: Nicole Escobar is a 48 y.o. female with the following: Right knee pain Painful bump left lower leg; 5 x 3 x 3 mm on [place]  Plan: Nicole Escobar has a painful swelling over the lower left leg.  Previous ultrasound demonstrated a small 5 x 3 x 3 mm area.  This is bothering her. She has been dealing with this for a while.  I would like to obtain an MRI of the left lower leg to further evaluate this mass.   She also reports right knee pain with popping and grinding.  XR with mild OA overall.  She has previously had an injection in her right knee, and this was successful.  She would like to repeat the injection today.  We will see her back in clinic once the MRI results are available.  Procedure note injection Right knee joint   Verbal consent was obtained to inject the right knee joint  Timeout was completed to confirm the site of injection.  The skin was prepped with alcohol and ethyl chloride was sprayed at the injection site.  A 21-gauge needle was used to inject 40 mg of Depo-Medrol and 1% lidocaine (4 cc) into the right knee using an anterolateral approach.  There were no complications. A sterile bandage was applied.   Follow-up: Return for After MRI.  Subjective:  Chief Complaint  Patient presents with   Leg Pain    Burning painful spot in anterior lower leg right.     History of Present Illness: Nicole Escobar is a 48 y.o. female who has been referred by Rennie Plowman, FNP for evaluation of bilateral leg pain.  She reports that she has a small area of growth in the left lower extremity.  She has felt this for a couple of years.  She reports that she is having a lot of burning and shooting pains around this area in the left lower leg.  It is tender to touch.  She has recently had an ultrasound, which demonstrated small growth.  She has not noticed any overlying skin changes.  She states that she has some smaller growths on the right leg, and a similar  area, and this is how the left leg issues started.  In addition, she has right knee pain.  Currently, she is having pain in the front of the knee.  She is previously had injections.  This was helpful.  She does not take medications.  She works at [employer], standing on her feet for long hours.   Review of Systems: No fevers or chills. No numbness or tingling No chest pain No shortness of breath No bowel or bladder dysfunction No GI distress No headaches   Medical History:  Past Medical History:  Diagnosis Date   Anxiety    Emphysema of lung (HCC)    Frequent headaches    unknown onset   Hyperlipidemia     Past Surgical History:  Procedure Laterality Date   DILATATION & CURETTAGE/HYSTEROSCOPY WITH MYOSURE N/A 01/29/2021   Procedure: DILATATION & CURETTAGE/HYSTEROSCOPY WITH MYOSURE;  Surgeon: Conard Novak, MD;  Location: [facility] ORS;  Service: Gynecology;  Laterality: N/A;   DILATION AND CURETTAGE OF UTERUS     TONSILECTOMY/ADENOIDECTOMY WITH MYRINGOTOMY Bilateral    TONSILLECTOMY     TUBAL LIGATION  2013    Family History  Problem Relation Age of Onset   Alcohol abuse Mother    Arthritis Mother    Cancer Mother  ovary   Hyperlipidemia Mother    Hypertension Mother    Diabetes Mother    Arthritis Maternal Grandmother    Hypertension Maternal Grandmother    Cancer Maternal Grandmother        colon   Rheum arthritis Maternal Grandfather 40   Heart attack Maternal Grandfather    Social History   Tobacco Use   Smoking status: Former    Current packs/day: 0.00    Types: Cigarettes    Quit date: 03/25/2003    Years since quitting: 20.2    Passive exposure: Past   Smokeless tobacco: Never  Vaping Use   Vaping status: Never Used  Substance Use Topics   Alcohol use: No    Alcohol/week: 0.0 standard drinks of alcohol   Drug use: Yes    Frequency: 0.4 times per week    Types: Marijuana    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   budesonide-formoterol (SYMBICORT) 80-4.5 MCG/ACT inhaler Inhale 2 puffs into the lungs 2 (two) times daily.   Cyanocobalamin (B-12) 1000 MCG SUBL Place 1 tablet under the tongue daily.   gabapentin (NEURONTIN) 300 MG capsule Take 1 capsule (300 mg total) by mouth 3 (three) times daily.   hydrOXYzine (ATARAX) 10 MG tablet Take 1 tablet (10 mg total) by mouth 3 (three) times daily as needed for anxiety.   Ibuprofen 200 MG CAPS    rosuvastatin (CRESTOR) 10 MG tablet Take 1 tablet (10 mg total) by mouth daily.   traZODone (DESYREL) 50 MG tablet Take 1 tablet (50 mg total) by mouth at bedtime.   venlafaxine XR (EFFEXOR-XR) 37.5 MG 24 hr capsule Start taking Venlafaxine 37.5 mg for week 1, on week 2 increase to 75mg  of Venlafaxine, on week 3 & 4 increase dose to 150 mg.    Objective: BP 133/85   LMP  (LMP Unknown)   Physical Exam:  General: Alert and oriented. and No acute distress. Gait: Normal gait.  Evaluation of left lower extremity demonstrates normal-appearing skin.  There is a small, compressible mass in the anterior and lateral lower leg.  She does have tenderness to palpation in this area.  Negative Tinel's.  No warmth is appreciated.  Right knee without effusion.  There is crepitus with range of motion.  She is able to achieve full extension, with flexion to about 110 degrees.  Negative Lachman.  No increased laxity to varus or valgus stress.  IMAGING: I personally reviewed images previously obtained in clinic   X-rays of the right knee were available.  Mild loss of joint space within the medial compartment.  Small osteophytes are appreciated.  Mild loss of joint space within the patellofemoral compartment, also with some small  osteophytes.  Results from the ultrasound were available.  There is a 5 x 3 x 3 mm growth in the anterior and lateral tibia.   New Medications:  No orders of the defined types were placed in this encounter.     Oliver Barre, MD  06/04/2023 8:22 AM

## 2023-06-15 ENCOUNTER — Ambulatory Visit (INDEPENDENT_AMBULATORY_CARE_PROVIDER_SITE_OTHER): Admitting: Family

## 2023-06-15 VITALS — BP 132/82 | HR 71 | Temp 97.6°F | Ht 64.0 in | Wt 204.7 lb

## 2023-06-15 DIAGNOSIS — M79604 Pain in right leg: Secondary | ICD-10-CM | POA: Diagnosis not present

## 2023-06-15 DIAGNOSIS — R140 Abdominal distension (gaseous): Secondary | ICD-10-CM | POA: Diagnosis not present

## 2023-06-15 DIAGNOSIS — F411 Generalized anxiety disorder: Secondary | ICD-10-CM

## 2023-06-15 DIAGNOSIS — M79605 Pain in left leg: Secondary | ICD-10-CM | POA: Diagnosis not present

## 2023-06-15 LAB — URINALYSIS, ROUTINE W REFLEX MICROSCOPIC
Bilirubin Urine: NEGATIVE
Hgb urine dipstick: NEGATIVE
Ketones, ur: NEGATIVE
Leukocytes,Ua: NEGATIVE
Nitrite: NEGATIVE
Specific Gravity, Urine: 1.02 (ref 1.000–1.030)
Total Protein, Urine: NEGATIVE
Urine Glucose: NEGATIVE
Urobilinogen, UA: 0.2 (ref 0.0–1.0)
pH: 6 (ref 5.0–8.0)

## 2023-06-15 LAB — TSH: TSH: 1.89 u[IU]/mL (ref 0.35–5.50)

## 2023-06-15 NOTE — Progress Notes (Unsigned)
   Assessment & Plan:  There are no diagnoses linked to this encounter.   Return precautions given.   Risks, benefits, and alternatives of the medications and treatment plan prescribed today were discussed, and patient expressed understanding.   Education regarding symptom management and diagnosis given to patient on AVS either electronically or printed.  No follow-ups on file.  Nicole Plowman, FNP  Subjective:    Patient ID: Nicole Escobar, female    DOB: June 07, 1975, 48 y.o.   MRN: [id number]  CC: Nicole Escobar is a 48 y.o. female who presents today for follow up.   HPI: HPI She has noticed right leg Increase gabapentin to 300 mg 3 times daily  Pending MRI left lower leg ordered by orthopedics, Dr Dallas Schimke.   Establishd care with [facility] 06/01/2023.  Compliant with Atarax 10 mg 3 times daily, trazodone 50 mg nightly, venlafaxine 150mg     She feels menstrual ,' phantom pain' without a menses. She is not sexually active.  Denies F, consistpation, N, V, early satiety.  Endorses abdominal bloating   12/28/20 Transvaginal US 33mm cervical mass  Allergies: Sulfa antibiotics Current Outpatient Medications on File Prior to Visit  Medication Sig Dispense Refill   albuterol (VENTOLIN HFA) 108 (90 Base) MCG/ACT inhaler Inhale 2 puffs into the lungs every 6 (six) hours as needed for wheezing or shortness of breath. 8 g 0   budesonide-formoterol (SYMBICORT) 80-4.5 MCG/ACT inhaler Inhale 2 puffs into the lungs 2 (two) times daily. 1 each 12   Cyanocobalamin (B-12) 1000 MCG SUBL Place 1 tablet under the tongue daily. 90 tablet 3   gabapentin (NEURONTIN) 300 MG capsule Take 1 capsule (300 mg total) by mouth 3 (three) times daily. 270 capsule 3   hydrOXYzine (ATARAX) 10 MG tablet Take 1 tablet (10 mg total) by mouth 3 (three) times daily as needed for anxiety. 30 tablet 0   Ibuprofen 200 MG CAPS      rosuvastatin (CRESTOR) 10 MG tablet Take 1 tablet (10 mg total) by mouth  daily. 90 tablet 3   traZODone (DESYREL) 50 MG tablet Take 1 tablet (50 mg total) by mouth at bedtime. 30 tablet 0   venlafaxine XR (EFFEXOR-XR) 37.5 MG 24 hr capsule Start taking Venlafaxine 37.5 mg for week 1, on week 2 increase to 75mg  of Venlafaxine, on week 3 & 4 increase dose to 150 mg. 80 capsule 0   No current facility-administered medications on file prior to visit.    Review of Systems    Objective:    BP 132/82   Pulse 71   Temp 97.6 F (36.4 C) (Oral)   Ht 5\' 4"  (1.626 m)   Wt 204 lb 11.2 oz (92.9 kg)   LMP  (LMP Unknown)   SpO2 98%   BMI 35.14 kg/m  BP Readings from Last 3 Encounters:  06/15/23 132/82  06/04/23 133/85  06/02/23 138/84   Wt Readings from Last 3 Encounters:  06/15/23 204 lb 11.2 oz (92.9 kg)  06/02/23 216 lb 12.8 oz (98.3 kg)  05/06/23 214 lb 9.6 oz (97.3 kg)    Physical Exam

## 2023-06-15 NOTE — Patient Instructions (Signed)
 I have ordered right leg MRI.  Please let me know if this is not scheduled with left leg  I printed information in regards to grief share.  Please let me know how you are doing

## 2023-06-16 ENCOUNTER — Telehealth: Payer: Self-pay | Admitting: Family

## 2023-06-16 LAB — CA 125: CA 125: 14 U/mL (ref <35)

## 2023-06-16 NOTE — Telephone Encounter (Signed)
 Lft pt vm to call ofc to sch MRI. thanks

## 2023-06-16 NOTE — Assessment & Plan Note (Signed)
 Abdominal bloating.  No vaginal bleeding.  Pending CA125, transvaginal ultrasound.  Recommend pelvic exam at follow-up

## 2023-06-16 NOTE — Assessment & Plan Note (Signed)
?    Lipoma right lower extremity.  Pending MRI bilateral tib-fib

## 2023-06-16 NOTE — Assessment & Plan Note (Signed)
 Exacerbated, severely uncontrolled.  Provided information for grief share for patient to attend this week.  Encourage patient to reduce workload and/or participate in FMLA to focus on mental health, grievance.  Continue Atarax 10 mg 3 times daily, trazodone 50 mg nightly, venlafaxine 150 mg

## 2023-06-17 ENCOUNTER — Encounter: Payer: Self-pay | Admitting: Family

## 2023-06-24 ENCOUNTER — Ambulatory Visit
Admission: RE | Admit: 2023-06-24 | Discharge: 2023-06-24 | Disposition: A | Discharge status: Home / Self Care | Source: Ambulatory Visit | Attending: Orthopedic Surgery | Admitting: Orthopedic Surgery

## 2023-06-24 DIAGNOSIS — M25462 Effusion, left knee: Secondary | ICD-10-CM | POA: Diagnosis not present

## 2023-06-24 DIAGNOSIS — R2242 Localized swelling, mass and lump, left lower limb: Secondary | ICD-10-CM

## 2023-06-24 DIAGNOSIS — R600 Localized edema: Secondary | ICD-10-CM | POA: Diagnosis not present

## 2023-06-24 DIAGNOSIS — M1712 Unilateral primary osteoarthritis, left knee: Secondary | ICD-10-CM | POA: Diagnosis not present

## 2023-06-24 MED ORDER — GADOPICLENOL 0.5 MMOL/ML IV SOLN
10.0000 mL | Freq: Once | INTRAVENOUS | Status: AC | PRN
Start: 2023-06-24 — End: 2023-06-24
  Administered 2023-06-24: 10 mL via INTRAVENOUS

## 2023-06-30 ENCOUNTER — Inpatient Hospital Stay: Admission: RE | Admit: 2023-06-30 | Source: Ambulatory Visit

## 2023-06-30 ENCOUNTER — Ambulatory Visit: Payer: Self-pay | Admitting: Psychiatry

## 2023-06-30 NOTE — Progress Notes (Deleted)
 [facility] MD/PA/NP OP Progress Note  06/30/2023 1:42 PM Nicole Escobar  MRN:  [id number]  Chief Complaint: No chief complaint on file.  HPI: *** Visit Diagnosis: No diagnosis found.  Past Psychiatric History:   Previous Psych Hospitalizations: Denies  Outpatient treatment:   Medications Current: - Hydroxyzine 10mg  daily for anxiety. - Effexor 150mg  daily for anxiety and depression. - Trazadone 50mg  for sleep  Medication Trials: - 07/07/22 Duloxetine stopped due to poor response. - 09/15/22 Buspirone stopped due to poor response -09/15/22 Fluoxetine stopped due to poor response - 2019 Sertraline dtopped due to poor response.  Suicide & Violence:  Psychotherapy:  Legal:   Past Medical History:  Past Medical History:  Diagnosis Date  . Anxiety   . Emphysema of lung (HCC)   . Frequent headaches    unknown onset  . Hyperlipidemia     Past Surgical History:  Procedure Laterality Date  . DILATATION & CURETTAGE/HYSTEROSCOPY WITH MYOSURE N/A 01/29/2021   Procedure: DILATATION & CURETTAGE/HYSTEROSCOPY WITH MYOSURE;  Surgeon: Conard Novak, MD;  Location: [facility] ORS;  Service: Gynecology;  Laterality: N/A;  . DILATION AND CURETTAGE OF UTERUS    . TONSILECTOMY/ADENOIDECTOMY WITH MYRINGOTOMY Bilateral   . TONSILLECTOMY    . TUBAL LIGATION  2013    Family Psychiatric History: Family history includes mother being a substance user, with potential for depression anxiety   Family History:  Family History  Problem Relation Age of Onset  . Alcohol abuse Mother   . Arthritis Mother   . Cancer Mother        ovary  . Hyperlipidemia Mother   . Hypertension Mother   . Diabetes Mother   . Arthritis Maternal Grandmother   . Hypertension Maternal Grandmother   . Cancer Maternal Grandmother        colon  . Rheum arthritis Maternal Grandfather 40  . Heart attack Maternal Grandfather     Social History:  Social History   Socioeconomic History  . Marital status: Widowed     Spouse name: Not on file  . Number of children: 2  . Years of education: Not on file  . Highest education level: GED or equivalent  Occupational History  . Not on file  Tobacco Use  . Smoking status: Former    Current packs/day: 0.00    Types: Cigarettes    Quit date: 03/25/2003    Years since quitting: 20.2    Passive exposure: Past  . Smokeless tobacco: Never  Vaping Use  . Vaping status: Never Used  Substance and Sexual Activity  . Alcohol use: No    Alcohol/week: 0.0 standard drinks of alcohol  . Drug use: Yes    Frequency: 0.4 times per week    Types: Marijuana    Comment: used crack 15 years. ocassionally uses marijuana  . Sexual activity: Not Currently    Partners: Male    Birth control/protection: Surgical    Comment: tubal 9 yrs ago  Other Topics Concern  . 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    69 year old daughter at [employer]   84 year old son   Long-term relationship - husband passed unexpectedly in 3 weeks from cancer 2024   GED degree          Patient has history of crack cocaine abuse in her 61's.  She has been sober since 2007.     Social Drivers of Dispensing optician  Resource Strain: Low Risk  (06/15/2023)   Overall Financial Resource Strain (CARDIA)   . Difficulty of Paying Living Expenses: Not very hard  Food Insecurity: Unknown (06/15/2023)   Hunger Vital Sign   . Worried About Programme researcher, broadcasting/film/video in the Last Year: Patient declined   . Ran Out of Food in the Last Year: Never true  Transportation Needs: No Transportation Needs (06/15/2023)   PRAPARE - Transportation   . Lack of Transportation (Medical): No   . Lack of Transportation (Non-Medical): No  Physical Activity: Unknown (06/15/2023)   Exercise Vital Sign   . Days of Exercise per Week: 0 days   . Minutes of Exercise per Session: Not on file  Stress: Stress Concern Present (06/15/2023)   [employer] of Occupational Health - Occupational  Stress Questionnaire   . Feeling of Stress : Very much  Social Connections: Socially Isolated (06/15/2023)   Social Connection and Isolation Panel [NHANES]   . Frequency of Communication with Friends and Family: More than three times a week   . Frequency of Social Gatherings with Friends and Family: Once a week   . Attends Religious Services: Never   . Active Member of Clubs or Organizations: No   . Attends Banker Meetings: Not on file   . Marital Status: Widowed    Allergies:  Allergies  Allergen Reactions  . Sulfa Antibiotics Rash    Metabolic Disorder Labs: Lab Results  Component Value Date   HGBA1C 5.7 07/04/2021   No results found for: "PROLACTIN" Lab Results  Component Value Date   CHOL 153 01/15/2023   TRIG 154.0 (H) 01/15/2023   HDL 41.40 01/15/2023   CHOLHDL 4 01/15/2023   VLDL 30.8 01/15/2023   LDLCALC 81 01/15/2023   LDLCALC 158 (H) 07/29/2022   Lab Results  Component Value Date   TSH 1.89 06/15/2023   TSH 2.00 07/04/2021    Therapeutic Level Labs: No results found for: "LITHIUM" No results found for: "VALPROATE" No results found for: "CBMZ"  Current Medications: Current Outpatient Medications  Medication Sig Dispense Refill  . albuterol (VENTOLIN HFA) 108 (90 Base) MCG/ACT inhaler Inhale 2 puffs into the lungs every 6 (six) hours as needed for wheezing or shortness of breath. 8 g 0  . budesonide-formoterol (SYMBICORT) 80-4.5 MCG/ACT inhaler Inhale 2 puffs into the lungs 2 (two) times daily. 1 each 12  . Cyanocobalamin (B-12) 1000 MCG SUBL Place 1 tablet under the tongue daily. 90 tablet 3  . gabapentin (NEURONTIN) 300 MG capsule Take 1 capsule (300 mg total) by mouth 3 (three) times daily. 270 capsule 3  . hydrOXYzine (ATARAX) 10 MG tablet Take 1 tablet (10 mg total) by mouth 3 (three) times daily as needed for anxiety. 30 tablet 0  . Ibuprofen 200 MG CAPS     . rosuvastatin (CRESTOR) 10 MG tablet Take 1 tablet (10 mg total) by mouth  daily. 90 tablet 3  . traZODone (DESYREL) 50 MG tablet Take 1 tablet (50 mg total) by mouth at bedtime. 30 tablet 0  . venlafaxine XR (EFFEXOR-XR) 37.5 MG 24 hr capsule Start taking Venlafaxine 37.5 mg for week 1, on week 2 increase to 75mg  of Venlafaxine, on week 3 & 4 increase dose to 150 mg. 80 capsule 0   No current facility-administered medications for this visit.     Musculoskeletal: Strength & Muscle Tone: {desc; muscle tone:32375} Gait & Station: {PE GAIT ED YNWG:95621} Patient leans: {Patient Leans:21022755}  Psychiatric Specialty Exam: Review of Systems  There were no vitals taken for this visit.There is no height or weight on file to calculate BMI.  General Appearance: {Appearance:22683}  Eye Contact:  {BHH EYE CONTACT:22684}  Speech:  {Speech:22685}  Volume:  {Volume (PAA):22686}  Mood:  {BHH MOOD:22306}  Affect:  {Affect (PAA):22687}  Thought Process:  {Thought Process (PAA):22688}  Orientation:  {BHH ORIENTATION (PAA):22689}  Thought Content: {Thought Content:22690}   Suicidal Thoughts:  {ST/HT (PAA):22692}  Homicidal Thoughts:  {ST/HT (PAA):22692}  Memory:  {BHH MEMORY:22881}  Judgement:  {Judgement (PAA):22694}  Insight:  {Insight (PAA):22695}  Psychomotor Activity:  {Psychomotor (PAA):22696}  Concentration:  {Concentration:21399}  Recall:  {BHH GOOD/FAIR/POOR:22877}  Fund of Knowledge: {BHH GOOD/FAIR/POOR:22877}  Language: {BHH GOOD/FAIR/POOR:22877}  Akathisia:  {BHH YES OR NO:22294}  Handed:  {Handed:22697}  AIMS (if indicated): {Desc; done/not:10129}  Assets:  {Assets (PAA):22698}  ADL's:  {BHH ZOX'W:96045}  Cognition: {chl bhh cognition:304700322}  Sleep:  {BHH GOOD/FAIR/POOR:22877}   Screenings: GAD-7    Flowsheet Row Office Visit from 06/02/2023 in [facility] Visit from 05/06/2023 in [facility] Visit from 10/17/2022 in [facility]  [employer] Visit from 09[facility]22/2022 in [facility] [employer] Visit from 09/12/2022 in [facility] [employer] [facility]  Total GAD-7 Score 0 21 0 0 21      PHQ2-9    Flowsheet Row Office Visit from 06/02/2023 in [facility] [employer] Visit from 06/01/2023 in [facility] Office Visit from 05/06/2023 in [facility] Visit from 02/10/2023 in [facility] [employer] Visit from 10/17/2022 in [facility] [employer]  PHQ-2 Total Score 0 4 3 0 0  PHQ-9 Total Score 0 19 15 -- 0      Flowsheet Row Office Visit from 06/01/2023 in [facility] ED from 08/19/2021 in [facility] at [facility] Admission (Discharged) from 01/29/2021 in [facility]  C-SSRS RISK CATEGORY No Risk No Risk No Risk        Assessment and Plan:  Assessment - Diagnosis:  Panic disorder [F41.0]   GAD (generalized anxiety disorder) [F41.1]  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  Plan - Medications:    Start Effexor 37.5 mg once a day on week 1, increase to 75 mg once a day on week 2, increased to 150 mg once a day on week 3 and 4, due to multiple failed SSRIs, Side effects of headaches, nervousness, insomnia, sedation, nausea or diarrhea, patient also educated to notify staff should she have worsening depression as well as suicidal thoughts. Start hydroxyzine 10 mg p.o. as needed for anxiety or panic attacks 3 times daily, informed of side effects of sedation and encouraged  not to drive while on medication. Start trazodone 50 mg p.o. as needed for sleep, nausea, vomiting, edema, blurred vision constipation and dry mouth and encouraged to notify clinic should she have the symptoms. - Psychotherapy: Recommended to start weekly therapy for prolonged grieving for prolonged grieving and provided local therapists available in the community - Education: Patient was educated on serotonin syndrome as well as medication purpose side  effects and adverse reactions.  Patient educated on prolonged grieving and recommended for therapy. - Follow-Up: Patient will be followed every 4 weeks due to symptoms of panic disorder and generalized anxiety disorder. - Referrals: Patient referred to outpatient therapy in the local community resource sheet has been provided. - Safety Planning: Patient denies having a firearm at home patient also denies SI, HI.   Patient/Guardian was advised Release of Information must be obtained prior to any record release in order to collaborate their care with an outside provider. Patient/Guardian was advised if they have not already done so to contact the registration department to sign all necessary forms in order for [place] to release information regarding their care.   Consent: Patient/Guardian gives verbal consent for treatment and assignment of benefits for services provided during this visit. Patient/Guardian expressed understanding and agreed to proceed.    Juliann Pares, NP 06/30/2023, 1:42 PM

## 2023-07-07 ENCOUNTER — Ambulatory Visit: Admitting: Psychiatry

## 2023-07-08 ENCOUNTER — Ambulatory Visit
Admission: RE | Admit: 2023-07-08 | Discharge: 2023-07-08 | Disposition: A | Discharge status: Home / Self Care | Source: Ambulatory Visit | Attending: Family

## 2023-07-08 DIAGNOSIS — R140 Abdominal distension (gaseous): Secondary | ICD-10-CM | POA: Diagnosis not present

## 2023-07-08 DIAGNOSIS — N83201 Unspecified ovarian cyst, right side: Secondary | ICD-10-CM | POA: Diagnosis not present

## 2023-07-22 ENCOUNTER — Telehealth: Payer: Self-pay

## 2023-07-22 ENCOUNTER — Ambulatory Visit

## 2023-07-22 DIAGNOSIS — E538 Deficiency of other specified B group vitamins: Secondary | ICD-10-CM

## 2023-07-22 MED ORDER — CYANOCOBALAMIN 1000 MCG/ML IJ SOLN
1000.0000 ug | Freq: Once | INTRAMUSCULAR | Status: AC
  Administered 2023-07-22: 1000 ug via INTRAMUSCULAR

## 2023-07-22 NOTE — Telephone Encounter (Signed)
 Copied from CRM [id number]. Topic: Clinical - Lab/Test Results >> Jul 22, 2023 12:42 PM Star East wrote: Reason for CRM: Patient still waiting on results from ultrasound- please call [phone]

## 2023-07-22 NOTE — Progress Notes (Cosign Needed Addendum)
 After obtaining consent, and per orders of Bascom Bossier, NP injection of B-12 given by Virgina Grills, CMA. Patient was given IM in R Deltoid. Patient tolerated well.

## 2023-07-23 NOTE — Telephone Encounter (Signed)
 Spoke to pt and explained to her that the US  has resulted and as soon as the provider results it I will reach back out to her. Pt verbalized understanding

## 2023-07-25 ENCOUNTER — Emergency Department

## 2023-07-25 ENCOUNTER — Emergency Department
Admission: EM | Admit: 2023-07-25 | Discharge: 2023-07-25 | Disposition: A | Discharge status: Home / Self Care | Attending: Emergency Medicine | Admitting: Emergency Medicine

## 2023-07-25 DIAGNOSIS — R1013 Epigastric pain: Secondary | ICD-10-CM | POA: Diagnosis not present

## 2023-07-25 DIAGNOSIS — K5730 Diverticulosis of large intestine without perforation or abscess without bleeding: Secondary | ICD-10-CM | POA: Diagnosis not present

## 2023-07-25 DIAGNOSIS — R109 Unspecified abdominal pain: Secondary | ICD-10-CM | POA: Diagnosis not present

## 2023-07-25 DIAGNOSIS — K429 Umbilical hernia without obstruction or gangrene: Secondary | ICD-10-CM | POA: Diagnosis not present

## 2023-07-25 LAB — COMPREHENSIVE METABOLIC PANEL WITH GFR
ALT: 23 U/L (ref 0–44)
AST: 26 U/L (ref 15–41)
Albumin: 4.1 g/dL (ref 3.5–5.0)
Alkaline Phosphatase: 55 U/L (ref 38–126)
Anion gap: 12 (ref 5–15)
BUN: 13 mg/dL (ref 6–20)
CO2: 21 mmol/L — ABNORMAL LOW (ref 22–32)
Calcium: 9 mg/dL (ref 8.9–10.3)
Chloride: 104 mmol/L (ref 98–111)
Creatinine, Ser: 0.75 mg/dL (ref 0.44–1.00)
GFR, Estimated: >60 mL/min (ref >60)
Glucose, Bld: 112 mg/dL — ABNORMAL HIGH (ref 70–99)
Potassium: 3.9 mmol/L (ref 3.5–5.1)
Sodium: 137 mmol/L (ref 135–145)
Total Bilirubin: 0.5 mg/dL (ref 0.0–1.2)
Total Protein: 7.4 g/dL (ref 6.5–8.1)

## 2023-07-25 LAB — URINALYSIS, ROUTINE W REFLEX MICROSCOPIC
Bilirubin Urine: NEGATIVE
Glucose, UA: NEGATIVE mg/dL
Hgb urine dipstick: NEGATIVE
Ketones, ur: NEGATIVE mg/dL
Leukocytes,Ua: NEGATIVE
Nitrite: NEGATIVE
Protein, ur: NEGATIVE mg/dL
Specific Gravity, Urine: 1.016 (ref 1.005–1.030)
pH: 6 (ref 5.0–8.0)

## 2023-07-25 LAB — CBC
HCT: 42.7 % (ref 36.0–46.0)
Hemoglobin: 14.3 g/dL (ref 12.0–15.0)
MCH: 28.7 pg (ref 26.0–34.0)
MCHC: 33.5 g/dL (ref 30.0–36.0)
MCV: 85.7 fL (ref 80.0–100.0)
Platelets: 317 10*3/uL (ref 150–400)
RBC: 4.98 MIL/uL (ref 3.87–5.11)
RDW: 12.5 % (ref 11.5–15.5)
WBC: 8.9 10*3/uL (ref 4.0–10.5)
nRBC: 0 % (ref 0.0–0.2)

## 2023-07-25 LAB — LIPASE, BLOOD: Lipase: 33 U/L (ref 11–51)

## 2023-07-25 LAB — POC URINE PREG, ED: Preg Test, Ur: NEGATIVE

## 2023-07-25 LAB — TROPONIN I (HIGH SENSITIVITY): Troponin I (High Sensitivity): 2 ng/L (ref <18)

## 2023-07-25 MED ORDER — KETOROLAC TROMETHAMINE 15 MG/ML IJ SOLN
15.0000 mg | Freq: Once | INTRAMUSCULAR | Status: AC
  Administered 2023-07-25: 15 mg via INTRAVENOUS
  Filled 2023-07-25: qty 1

## 2023-07-25 MED ORDER — IOHEXOL 300 MG/ML  SOLN
100.0000 mL | Freq: Once | INTRAMUSCULAR | Status: AC | PRN
  Administered 2023-07-25: 100 mL via INTRAVENOUS

## 2023-07-25 MED ORDER — FAMOTIDINE 20 MG PO TABS: 20.0000 mg | ORAL_TABLET | Freq: Every day | ORAL | 0 refills | Status: DC

## 2023-07-25 NOTE — Discharge Instructions (Signed)
 You are seen in the emergency department for abdominal pain.  Your CT scan was reassuring and that did not show any findings.  You did have findings of diverticulosis but this did not appear infected.  This would not be causing your pain today.  You could have gastritis or peptic ulcer disease.  You were started on an acid reflux medicine, take as prescribed and follow-up closely with your primary care physician.  Return to the emergency department for any worsening symptoms.

## 2023-07-25 NOTE — ED Triage Notes (Signed)
 Pt to ED from home with central abdominal pain x3 weeks following an ultrasound that dx pt with cysts. Pt states she is experiencing intermittent nausea/fatigue, no fevers. LMP 3 weeks ago. Denies urinary symptoms.

## 2023-07-25 NOTE — ED Provider Notes (Signed)
 [facility] Provider Note    Event Date/Time   First MD Initiated Contact with Patient 07/25/23 1222     (approximate)   History   Abdominal Pain   HPI  Nicole Escobar is a 48 y.o. female presents to the emergency department with epigastric abdominal pain.  Patient states that over the past couple of days she has had upper abdominal feels like a tightness sensation.  Denies association with nausea or vomiting.  States that she had an ultrasound done of her pelvis that showed a questionable cyst, was never told of the results with her primary care physician and was concerned that it could be a ovarian issue.  Denies any significant alcohol use or marijuana use.  Denies any chest pain or shortness of breath.  No prior abdominal surgeries.  Patient is very tearful on my initial evaluation and states that she does not feel well.     Physical Exam   Triage Vital Signs: ED Triage Vitals  Encounter Vitals Group     BP 07/25/23 1017 (!) 147/80     Systolic BP Percentile --      Diastolic BP Percentile --      Pulse Rate 07/25/23 1017 77     Resp 07/25/23 1017 18     Temp 07/25/23 1017 97.6 F (36.4 C)     Temp Source 07/25/23 1017 Oral     SpO2 07/25/23 1017 96 %     Weight --      Height --      Head Circumference --      Peak Flow --      Pain Score 07/25/23 1015 8     Pain Loc --      Pain Education --      Exclude from Growth Chart --     Most recent vital signs: Vitals:   07/25/23 1017  BP: (!) 147/80  Pulse: 77  Resp: 18  Temp: 97.6 F (36.4 C)  SpO2: 96%    Physical Exam Constitutional:      Appearance: She is well-developed.  HENT:     Head: Atraumatic.  Eyes:     Conjunctiva/sclera: Conjunctivae normal.  Cardiovascular:     Rate and Rhythm: Regular rhythm.  Pulmonary:     Effort: No respiratory distress.  Abdominal:     General: There is no distension.     Tenderness: There is abdominal tenderness in the epigastric area.  There is no right CVA tenderness, left CVA tenderness, guarding or rebound. Negative signs include Murphy's sign, Rovsing's sign and McBurney's sign.  Musculoskeletal:        General: Normal range of motion.     Cervical back: Normal range of motion.  Skin:    General: Skin is warm.     Capillary Refill: Capillary refill takes less than 2 seconds.  Neurological:     Mental Status: She is alert. Mental status is at baseline.     IMPRESSION / MDM / ASSESSMENT AND PLAN / ED COURSE  I reviewed the triage vital signs and the nursing notes.  Differential diagnosis including gastritis/peptic ulcer disease, intra-abdominal abscess, gallbladder disease, ACS, malignancy  Abdomen with mild tenderness to palpation but no rebound or guarding.  No lower abdominal tenderness to palpation.  Have a very low suspicion for ovarian torsion, ovarian cyst or GU source of her abdominal pain.   RADIOLOGY I independently reviewed imaging, my interpretation of imaging: CT scan abdomen and pelvis with  contrast showed no acute intra-abdominal process.  Diverticulosis but no findings of acute diverticulitis.  LABS (all labs ordered are listed, but only abnormal results are displayed) Labs interpreted as -    Labs Reviewed  COMPREHENSIVE METABOLIC PANEL WITH GFR - Abnormal; Notable for the following components:      Result Value   CO2 21 (*)    Glucose, Bld 112 (*)    All other components within normal limits  URINALYSIS, ROUTINE W REFLEX MICROSCOPIC - Abnormal; Notable for the following components:   Color, Urine YELLOW (*)    APPearance CLEAR (*)    All other components within normal limits  LIPASE, BLOOD  CBC  POC URINE PREG, ED  TROPONIN I (HIGH SENSITIVITY)     MDM  Patient's lab work is overall reassuring.  No leukocytosis.  Creatinine is at her baseline with no significant electrolyte abnormalities.  Urine with no signs of urinary tract infection.  Lipase within normal limits.  No pregnancy.   Troponin is negative, have a low suspicion for ACS given that her symptoms have been ongoing for the past 2 days.  CT scan without findings concerning for acute cholecystitis and no gallstones noted on CT scan.  Patient had a negative Murphy sign and no focal right upper quadrant abdominal tenderness to palpation have a low suspicion for gallbladder etiology.  On reevaluation patient states she is feeling better but does have concerns of what is causing her upper abdominal tightness.  Discussed with the patient that it could be from gastritis or peptic ulcer disease discussed starting on a acid reducing medication.  Discussed following up closely with primary care provider.  Patient stated that she was upset and wanted to know what was causing her pain.  I discussed that I am uncertain of what is causing her pain but I do not find an emergent etiology for her pain.  Continues to have a nonfocal abdominal exam with no rebound or guarding.  Discussed close follow-up with primary care provider.  Given return precautions for any return or worsening symptoms.     PROCEDURES:  Critical Care performed: No  Procedures  Patient's presentation is most consistent with acute presentation with potential threat to life or bodily function.   MEDICATIONS ORDERED IN ED: Medications  ketorolac  (TORADOL ) 15 MG/ML injection 15 mg (15 mg Intravenous Given 07/25/23 1321)  iohexol (OMNIPAQUE) 300 MG/ML solution 100 mL (100 mLs Intravenous Contrast Given 07/25/23 1334)    FINAL CLINICAL IMPRESSION(S) / ED DIAGNOSES   Final diagnoses:  Epigastric pain     Rx / DC Orders   ED Discharge Orders          Ordered    famotidine  (PEPCID ) 20 MG tablet  Daily        07/25/23 1421             Note:  This document was prepared using Dragon voice recognition software and may include unintentional dictation errors.   Viviano Ground, MD 07/25/23 1606

## 2023-07-25 NOTE — ED Notes (Signed)
 Dr. Dodson Freestone went over test results with patient. Patient was upset with results and crying. This RN spoke with patient and offered to have provider come back and speak with patient. Patient refused. This RN informed charge RN and charge RN spoke with patient and offered to have Dr. Dodson Freestone come back and review test results with patient, patient refused a second time and insisted on leaving.

## 2023-07-27 ENCOUNTER — Ambulatory Visit (INDEPENDENT_AMBULATORY_CARE_PROVIDER_SITE_OTHER): Admitting: Family

## 2023-07-27 VITALS — BP 154/96 | HR 87 | Temp 98.1°F | Ht 64.0 in | Wt 209.8 lb

## 2023-07-27 DIAGNOSIS — N83201 Unspecified ovarian cyst, right side: Secondary | ICD-10-CM | POA: Diagnosis not present

## 2023-07-27 DIAGNOSIS — I700 Atherosclerosis of aorta: Secondary | ICD-10-CM | POA: Diagnosis not present

## 2023-07-27 DIAGNOSIS — I10 Essential (primary) hypertension: Secondary | ICD-10-CM | POA: Diagnosis not present

## 2023-07-27 DIAGNOSIS — R1012 Left upper quadrant pain: Secondary | ICD-10-CM | POA: Diagnosis not present

## 2023-07-27 LAB — C-REACTIVE PROTEIN: CRP: <1 mg/dL (ref 0.5–20.0)

## 2023-07-27 MED ORDER — AMLODIPINE BESYLATE 2.5 MG PO TABS: 2.5000 mg | ORAL_TABLET | Freq: Every day | ORAL | 2 refills | Status: DC

## 2023-07-27 MED ORDER — PANTOPRAZOLE SODIUM 40 MG PO TBEC: 40.0000 mg | DELAYED_RELEASE_TABLET | Freq: Every day | ORAL | 3 refills | Status: DC

## 2023-07-27 MED ORDER — DICYCLOMINE HCL 10 MG PO CAPS: 10.0000 mg | ORAL_CAPSULE | Freq: Three times a day (TID) | ORAL | 1 refills | Status: DC | PRN

## 2023-07-27 MED ORDER — ROSUVASTATIN CALCIUM 10 MG PO TABS: 10.0000 mg | ORAL_TABLET | Freq: Every day | ORAL | 3 refills | Status: DC

## 2023-07-27 NOTE — Progress Notes (Unsigned)
 Assessment & Plan:  LUQ pain Assessment & Plan: Reviewed emergency visit, imaging and labs.  Pain improves with eating.  Differential includes NSAID induced gastritis.  Differential would also include IBS. Start protonix .  Provided prescription for Bentyl  to be used as needed.  Counseled on FODMAP diet.  Referral to gastroenterology as due for colonoscopy and may also need upper endoscopy.  Pending labs, stool studies. Close follow up.   Orders: -     Ambulatory referral to Gastroenterology -     Pantoprazole  Sodium; Take 1 tablet (40 mg total) by mouth daily. Take 30 minutes prior to eating breakfast  Dispense: 30 tablet; Refill: 3 -     Dicyclomine  HCl; Take 1 capsule (10 mg total) by mouth 3 (three) times daily as needed for spasms. Use prn in anticipation of a stressor that triggers abdominal pain.  Dispense: 30 capsule; Refill: 1 -     Calprotectin, Fecal; Future -     C-reactive protein -     Stool culture; Future -     H. pylori breath test -     Gastrointestinal Pathogen Pnl RT, PCR; Future  Atherosclerosis of aorta (HCC) -     Rosuvastatin  Calcium ; Take 1 tablet (10 mg total) by mouth daily.  Dispense: 90 tablet; Refill: 3  Right ovarian cyst Assessment & Plan: Discussed right ovarian cyst favored to be complex hemorrhagic cyst.  Doubt this is in relationship to abdominal pain based on size.  Referral to GYN for further evaluation, surveillance as I suspect that she will would require repeat ultrasound.  Advised against NSAIDs for pelvic pain and to use tylenol .   Orders: -     Ambulatory referral to Obstetrics / Gynecology -     Ambulatory referral to Gastroenterology  Hypertension, unspecified type Assessment & Plan: BP has been elevated intermittently in the past.  Suspect elevation today worsened by abdominal pain, health concerns, anxiety, HA. Discussed alarm features which would warrant presenting to ED. provided prescription of amlodipine 2.5 mg to take for blood  pressure greater than 140/80. Close follow up.   Orders: -     amLODIPine Besylate; Take 1 tablet (2.5 mg total) by mouth daily.  Dispense: 30 tablet; Refill: 2     Return precautions given.   Risks, benefits, and alternatives of the medications and treatment plan prescribed today were discussed, and patient expressed understanding.   Education regarding symptom management and diagnosis given to patient on AVS either electronically or printed.  No follow-ups on file.  Bascom Bossier, FNP I have spent 40 minutes with a patient including precharting, exam, reviewing medical records ED visit, CT abdomen and pelvis, transvaginal US , and discussion plan of care.      Subjective:    Patient ID: Nicole Escobar, female    DOB: [birth year], 48 y.o.   MRN: [id number]  CC: Nicole Escobar is a 48 y.o. female who presents today for follow up.   HPI: Complains of LUQ pain 3 weeks  Pain will move to LLQ, constant.  If she lays on left side, pain will improve.  Eating makes it feel better.   Endorses associated fatigue, weight gain.   She reports for years that she has fluctuating diarrhea and constipation.  No rash, fever, UA, flank pain  She has normal BM. No blood in stool.   She feels full after eating. Denies pain with swallowing, choking.   She has been taking ibuprofen  600mg  every day with some relief  She  doesn't feel that she has pulled a muscle.   No alcohol use.     Follow up ED 07/25/23 for epigastric pain Started on pepcid  ac  CT a/p without acute findings Troponin 2  UA negative RBCs, WBCs No leukocytosis Lipase 33  Compliant with Atarax  10 mg 3 times daily, trazodone  50 mg nightly, venlafaxine  150 mg    Ca 125 , 14   07/08/23 US  pelvic 2.3 cm complex right ovarian cyst, favored to reflect a mildly complex hemorrhagic cyst. Small volume free fluid within the pelvis, nonspecific, but most commonly physiologic.  LMP 07/10/23  She is not sexually  active  Negative celiac 07/2021  Allergies: Sulfa antibiotics Current Outpatient Medications on File Prior to Visit  Medication Sig Dispense Refill   albuterol  (VENTOLIN  HFA) 108 (90 Base) MCG/ACT inhaler Inhale 2 puffs into the lungs every 6 (six) hours as needed for wheezing or shortness of breath. 8 g 0   budesonide -formoterol  (SYMBICORT ) 80-4.5 MCG/ACT inhaler Inhale 2 puffs into the lungs 2 (two) times daily. 1 each 12   Cyanocobalamin  (B-12) 1000 MCG SUBL Place 1 tablet under the tongue daily. 90 tablet 3   famotidine  (PEPCID ) 20 MG tablet Take 1 tablet (20 mg total) by mouth daily for 14 days. 14 tablet 0   gabapentin  (NEURONTIN ) 300 MG capsule Take 1 capsule (300 mg total) by mouth 3 (three) times daily. 270 capsule 3   hydrOXYzine  (ATARAX ) 10 MG tablet Take 1 tablet (10 mg total) by mouth 3 (three) times daily as needed for anxiety. 30 tablet 0   Ibuprofen  200 MG CAPS      No current facility-administered medications on file prior to visit.    Review of Systems  Constitutional:  Negative for chills and fever.  Respiratory:  Negative for cough.   Cardiovascular:  Negative for chest pain and palpitations.  Gastrointestinal:  Positive for abdominal pain, constipation (fluctuating) and diarrhea (fluctuating). Negative for nausea and vomiting.  Neurological:  Positive for headaches.  Psychiatric/Behavioral:  The patient is nervous/anxious.       Objective:    BP (!) 154/96   Pulse 87   Temp 98.1 F (36.7 C) (Oral)   Ht 5\' 4"  (1.626 m)   Wt 209 lb 12.8 oz (95.2 kg)   LMP 07/04/2023   SpO2 96%   BMI 36.01 kg/m  BP Readings from Last 3 Encounters:  07/27/23 (!) 154/96  07/25/23 (!) 147/80  06/15/23 132/82   Wt Readings from Last 3 Encounters:  07/27/23 209 lb 12.8 oz (95.2 kg)  06/15/23 204 lb 11.2 oz (92.9 kg)  06/02/23 216 lb 12.8 oz (98.3 kg)    Physical Exam Vitals reviewed.  Constitutional:      Appearance: Normal appearance. She is well-developed.  Eyes:      Conjunctiva/sclera: Conjunctivae normal.  Cardiovascular:     Rate and Rhythm: Normal rate and regular rhythm.     Pulses: Normal pulses.     Heart sounds: Normal heart sounds.  Pulmonary:     Effort: Pulmonary effort is normal.     Breath sounds: Normal breath sounds. No wheezing, rhonchi or rales.  Abdominal:     General: Bowel sounds are normal. There is no distension.     Palpations: Abdomen is soft. Abdomen is not rigid. There is no fluid wave or mass.     Tenderness: There is no abdominal tenderness. There is no right CVA tenderness, left CVA tenderness, guarding or rebound. Negative signs include Murphy's sign and McBurney's  sign.       Comments: Abdomen is soft, nondistended . area of pain as described by patient and marked on diagram.  Unable to reproduce pain on exam today  Skin:    General: Skin is warm and dry.  Neurological:     Mental Status: She is alert.  Psychiatric:        Speech: Speech normal.        Behavior: Behavior normal.        Thought Content: Thought content normal.

## 2023-07-27 NOTE — Patient Instructions (Addendum)
 Start low dose amlodipine due to elevated blood pressure if greater than 140/80, please take amlodipine.   Concern for differentials including  peptic ulcer, h pylori infection, IBS   Referral to gastroenterology for endoscopy and colonoscopy  Referral to GYN for surveillance of ovarian cyst   Trial of protonix  once daily for ulcer , bentyl  as needed for abdominal spasm  NO NSAIDS such as ibuprofen   Please keep food diary , monitor lactose.   Read about diet for IBS plan  Return stool studies ASAP    Low-FODMAP Eating Plan  FODMAP stands for fermentable oligosaccharides, disaccharides, monosaccharides, and polyols. These are sugars that are hard for some people to digest. A low-FODMAP eating plan may help some people who have irritable bowel syndrome (IBS) and certain other bowel (intestinal) diseases to manage their symptoms. This meal plan can be complicated to follow. Work with a diet and nutrition specialist (dietitian) to make a low-FODMAP eating plan that is right for you. A dietitian can help make sure that you get enough nutrition from this diet. What are tips for following this plan? Reading food labels Check labels for hidden FODMAPs such as: High-fructose syrup. Honey. Agave. Natural fruit flavors. Onion or garlic powder. Choose low-FODMAP foods that contain 3-4 grams of fiber per serving. Check food labels for serving sizes. Eat only one serving at a time to make sure FODMAP levels stay low. Shopping Shop with a list of foods that are recommended on this diet and make a meal plan. Meal planning Follow a low-FODMAP eating plan for up to 6 weeks, or as told by your health care provider or dietitian. To follow the eating plan: Eliminate high-FODMAP foods from your diet completely. Choose only low-FODMAP foods to eat. You will do this for 2-6 weeks. Gradually reintroduce high-FODMAP foods into your diet one at a time. Most people should wait a few days before  introducing the next new high-FODMAP food into their meal plan. Your dietitian can recommend how quickly you may reintroduce foods. Keep a daily record of what and how much you eat and drink. Make note of any symptoms that you have after eating. Review your daily record with a dietitian regularly to identify which foods you can eat and which foods you should avoid. General tips Drink enough fluid each day to keep your urine pale yellow. Avoid processed foods. These often have added sugar and may be high in FODMAPs. Avoid most dairy products, whole grains, and sweeteners. Work with a dietitian to make sure you get enough fiber in your diet. Avoid high FODMAP foods at meals to manage symptoms. Recommended foods Fruits Bananas, oranges, tangerines, lemons, limes, blueberries, raspberries, strawberries, grapes, cantaloupe, honeydew melon, kiwi, papaya, passion fruit, and pineapple. Limited amounts of dried cranberries, banana chips, and shredded coconut. Vegetables Eggplant, zucchini, cucumber, peppers, green beans, bean sprouts, lettuce, arugula, kale, Swiss chard, spinach, collard greens, bok choy, summer squash, potato, and tomato. Limited amounts of corn, carrot, and sweet potato. Green parts of scallions. Grains Gluten-free grains, such as rice, oats, buckwheat, quinoa, corn, polenta, and millet. Gluten-free pasta, bread, or cereal. Rice noodles. Corn tortillas. Meats and other proteins Unseasoned beef, pork, poultry, or fish. Eggs. Helene Loader. Tofu (firm) and tempeh. Limited amounts of nuts and seeds, such as almonds, walnuts, [place] nuts, pecans, peanuts, nut butters, pumpkin seeds, chia seeds, and sunflower seeds. Dairy Lactose-free milk, yogurt, and kefir. Lactose-free cottage cheese and ice cream. Non-dairy milks, such as almond, coconut, hemp, and rice milk. Non-dairy yogurt.  Limited amounts of goat cheese, brie, mozzarella, parmesan, swiss, and other hard cheeses. Fats and oils Butter-free  spreads. Vegetable oils, such as olive, canola, and sunflower oil. Seasoning and other foods Artificial sweeteners with names that do not end in "ol," such as aspartame, saccharine, and stevia. Maple syrup, white table sugar, raw sugar, brown sugar, and molasses. Mayonnaise, soy sauce, and tamari. Fresh basil, coriander, parsley, rosemary, and thyme. Beverages Water and mineral water. Sugar-sweetened soft drinks. Small amounts of orange juice or cranberry juice. Black and green tea. Most dry wines. Coffee. The items listed above may not be a complete list of foods and beverages you can eat. Contact a dietitian for more information. Foods to avoid Fruits Fresh, dried, and juiced forms of apple, pear, watermelon, peach, plum, cherries, apricots, blackberries, boysenberries, figs, nectarines, and mango. Avocado. Vegetables Chicory root, artichoke, asparagus, cabbage, snow peas, Brussels sprouts, broccoli, sugar snap peas, mushrooms, celery, and cauliflower. Onions, garlic, leeks, and the white part of scallions. Grains Wheat, including kamut, durum, and semolina. Barley and bulgur. Couscous. Wheat-based cereals. Wheat noodles, bread, crackers, and pastries. Meats and other proteins Fried or fatty meat. Sausage. Cashews and pistachios. Soybeans, baked beans, black beans, chickpeas, kidney beans, fava beans, navy beans, lentils, black-eyed peas, and split peas. Dairy Milk, yogurt, ice cream, and soft cheese. Cream and sour cream. Milk-based sauces. Custard. Buttermilk. Soy milk. Seasoning and other foods Any sugar-free gum or candy. Foods that contain artificial sweeteners such as sorbitol, mannitol, isomalt, or xylitol. Foods that contain honey, high-fructose corn syrup, or agave. Bouillon, vegetable stock, beef stock, and chicken stock. Garlic and onion powder. Condiments made with onion, such as hummus, chutney, pickles, relish, salad dressing, and salsa. Tomato paste. Beverages Chicory-based  drinks. Coffee substitutes. Chamomile tea. Fennel tea. Sweet or fortified wines such as port or sherry. Diet soft drinks made with isomalt, mannitol, maltitol, sorbitol, or xylitol. Apple, pear, and mango juice. Juices with high-fructose corn syrup. The items listed above may not be a complete list of foods and beverages you should avoid. Contact a dietitian for more information. Summary FODMAP stands for fermentable oligosaccharides, disaccharides, monosaccharides, and polyols. These are sugars that are hard for some people to digest. A low-FODMAP eating plan is a short-term diet that helps to ease symptoms of certain bowel diseases. The eating plan usually lasts up to 6 weeks. After that, high-FODMAP foods are reintroduced gradually and one at a time. This can help you find out which foods may be causing symptoms. A low-FODMAP eating plan can be complicated. It is best to work with a dietitian who has experience with this type of plan. This information is not intended to replace advice given to you by your health care provider. Make sure you discuss any questions you have with your health care provider. Document Revised: 07/28/2019 Document Reviewed: 07/28/2019 Elsevier Patient Education  2024 [employer].

## 2023-07-28 LAB — H. PYLORI BREATH TEST: H. pylori Breath Test: NOT DETECTED

## 2023-07-29 ENCOUNTER — Encounter: Payer: Self-pay | Admitting: Family

## 2023-07-29 NOTE — Assessment & Plan Note (Addendum)
 Reviewed emergency visit, imaging and labs.  Pain improves with eating.  Differential includes NSAID induced gastritis.  Differential would also include IBS. Start protonix .  Provided prescription for Bentyl  to be used as needed.  Counseled on FODMAP diet.  Referral to gastroenterology as due for colonoscopy and may also need upper endoscopy.  Pending labs, stool studies. Close follow up.

## 2023-07-29 NOTE — Assessment & Plan Note (Signed)
 Discussed right ovarian cyst favored to be complex hemorrhagic cyst.  Doubt this is in relationship to abdominal pain based on size.  Referral to GYN for further evaluation, surveillance as I suspect that she will would require repeat ultrasound.  Advised against NSAIDs for pelvic pain and to use tylenol .

## 2023-07-29 NOTE — Assessment & Plan Note (Signed)
 BP has been elevated intermittently in the past.  Suspect elevation today worsened by abdominal pain, health concerns, anxiety, HA. Discussed alarm features which would warrant presenting to ED. provided prescription of amlodipine 2.5 mg to take for blood pressure greater than 140/80. Close follow up.

## 2023-08-11 ENCOUNTER — Ambulatory Visit: Payer: BC Managed Care – PPO | Admitting: Family

## 2023-08-21 ENCOUNTER — Ambulatory Visit

## 2023-09-24 ENCOUNTER — Encounter: Payer: Self-pay | Admitting: Family

## 2023-10-30 ENCOUNTER — Telehealth: Admitting: Family Medicine

## 2023-10-30 ENCOUNTER — Ambulatory Visit: Payer: Self-pay

## 2023-10-30 DIAGNOSIS — J4540 Moderate persistent asthma, uncomplicated: Secondary | ICD-10-CM | POA: Diagnosis not present

## 2023-10-30 MED ORDER — PROMETHAZINE-DM 6.25-15 MG/5ML PO SYRP: 5.0000 mL | ORAL_SOLUTION | Freq: Four times a day (QID) | ORAL | 0 refills | Status: DC | PRN

## 2023-10-30 MED ORDER — PREDNISONE 20 MG PO TABS: 40.0000 mg | ORAL_TABLET | Freq: Every day | ORAL | 0 refills | Status: DC

## 2023-10-30 NOTE — Telephone Encounter (Signed)
 FYI Only or Action Required?: FYI only for provider.  Patient was last seen in primary care on 07/27/2023 by Dineen Rollene MATSU, FNP.  Called Nurse Triage reporting Cough.  Symptoms began yesterday.  Interventions attempted: Nothing.  Symptoms are: gradually worsening.  Triage Disposition: See HCP Within 4 Hours (Or PCP Triage)  Patient/caregiver understands and will follow disposition?: Yes  **No appts. Available in the office for today. Patient scheduled a virtual UC for 8/8.        Copied from CRM [id number]. Topic: Clinical - Red Word Triage >> Oct 30, 2023  2:01 PM Viola F wrote: Patient woke up with sore throat, headache, chest hurts when cough, requested appt and note from doctor Reason for Disposition  Wheezing is present  Answer Assessment - Initial Assessment Questions 1. ONSET: When did the cough begin?      X 1 day  2. SEVERITY: How bad is the cough today?      Moderate  3. SPUTUM: Describe the color of your sputum (e.g., none, dry cough; clear, white, yellow, green)     None   4. HEMOPTYSIS: Are you coughing up any blood? If Yes, ask: How much? (e.g., flecks, streaks, tablespoons, etc.)     No   5. DIFFICULTY BREATHING: Are you having difficulty breathing? If Yes, ask: How bad is it? (e.g., mild, moderate, severe)      No   6. FEVER: Do you have a fever? If Yes, ask: What is your temperature, how was it measured, and when did it start?    Mild grade of 99.0   7. CARDIAC HISTORY: Do you have any history of heart disease? (e.g., heart attack, congestive heart failure)      No   8. LUNG HISTORY: Do you have any history of lung disease?  (e.g., pulmonary embolus, asthma, emphysema)   Asthma; uses inhaler   9. PE RISK FACTORS: Do you have a history of blood clots? (or: recent major surgery, recent prolonged travel, bedridden)     No   10. OTHER SYMPTOMS: Do you have any other symptoms? 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 Pt. Needs a return to work note. No appts. Available in the office for today. Patient scheduled a virtual UC for 8/8.  Protocols used: Cough - Acute Non-Productive-A-AH

## 2023-10-30 NOTE — Progress Notes (Signed)
 Virtual Visit Consent   Nicole Escobar, you are scheduled for a virtual visit with a [facility] provider today. Just as with appointments in the office, your consent must be obtained to participate. Your consent will be active for this visit and any virtual visit you may have with one of our providers in the next 365 days. If you have a MyChart account, a copy of this consent can be sent to you electronically.  As this is a virtual visit, video technology does not allow for your provider to perform a traditional examination. This may limit your provider's ability to fully assess your condition. If your provider identifies any concerns that need to be evaluated in person or the need to arrange testing (such as labs, EKG, etc.), we will make arrangements to do so. Although advances in technology are sophisticated, we cannot ensure that it will always work on either your end or our end. If the connection with a video visit is poor, the visit may have to be switched to a telephone visit. With either a video or telephone visit, we are not always able to ensure that we have a secure connection.  By engaging in this virtual visit, you consent to the provision of healthcare and authorize for your insurance to be billed (if applicable) for the services provided during this visit. Depending on your insurance coverage, you may receive a charge related to this service.  I need to obtain your verbal consent now. Are you willing to proceed with your visit today? Nicole Escobar has provided verbal consent on 10/30/2023 for a virtual visit (video or telephone). Nicole Lamp, FNP  Date: 10/30/2023 4:03 PM   Virtual Visit via Video Note   I, Nicole Escobar, connected with  Nicole Escobar  ([id number], [birth year]) on 10/30/23 at  4:00 PM EDT by a video-enabled telemedicine application and verified that I am speaking with the correct person using two identifiers.  Location: Patient: Virtual Visit Location Patient:  Home Provider: Virtual Visit Location Provider: Home Office   I discussed the limitations of evaluation and management by telemedicine and the availability of in person appointments. The patient expressed understanding and agreed to proceed.    History of Present Illness: Nicole Escobar is a 48 y.o. who identifies as a female who was assigned female at birth, and is being seen today for Sore throat, raw painful cough ,no fever. Fatigue. Sx for 2 days with a history of asthma.   HPI: HPI  Problems:  Patient Active Problem List   Diagnosis Date Noted   LUQ pain 07/27/2023   Right ovarian cyst 07/27/2023   Abdominal bloating 06/15/2023   Atherosclerosis of aorta (HCC) 07/26/2022   Left low back pain 07/25/2022   Right shoulder pain 11/19/2021   Positive ANA (antinuclear antibody) 08/20/2021   Leg pain 08/13/2021   HTN (hypertension) 08/13/2021   Arthritis of knee 07/04/2021   PND (post-nasal drip) 06/18/2021   History of cervical polypectomy 04/25/2021   History of cervical dysplasia 04/25/2021   Wheezing 03/11/2021   Acute cough 03/11/2021   Viral upper respiratory tract infection 03/11/2021   Menorrhagia with irregular cycle 01/29/2021   Uterine mass 01/29/2021   Bilateral knee pain 08/28/2020   Arthralgia 08/28/2020   External hemorrhoid, thrombosed 07/26/2019   Asymptomatic microscopic hematuria 05/11/2019   Varicose veins of both lower extremities with pain 05/11/2019   Obesity (BMI 35.0-39.9 without comorbidity) 05/11/2019   B12 deficiency 04/27/2019   Polyarthralgia 04/27/2019  Grief reaction 02/08/2018   Lower abdominal pain 02/08/2018   Boil 02/08/2018   Elevated blood pressure reading 02/08/2018   Chest pain 02/08/2018   Left ear pain 11/18/2016   History of cocaine abuse (HCC) 04/03/2016   Bronchitis 06/05/2015   Influenza-like illness 05/14/2015   Benign paroxysmal positional vertigo 10/17/2014   Bilateral hand pain 05/16/2014   Bilateral carpal tunnel  syndrome 05/16/2014   Stress headaches 03/16/2014    Allergies:  Allergies  Allergen Reactions   Sulfa Antibiotics Rash   Medications:  Current Outpatient Medications:    albuterol  (VENTOLIN  HFA) 108 (90 Base) MCG/ACT inhaler, Inhale 2 puffs into the lungs every 6 (six) hours as needed for wheezing or shortness of breath., Disp: 8 g, Rfl: 0   amLODipine  (NORVASC ) 2.5 MG tablet, Take 1 tablet (2.5 mg total) by mouth daily., Disp: 30 tablet, Rfl: 2   budesonide -formoterol  (SYMBICORT ) 80-4.5 MCG/ACT inhaler, Inhale 2 puffs into the lungs 2 (two) times daily., Disp: 1 each, Rfl: 12   Cyanocobalamin  (B-12) 1000 MCG SUBL, Place 1 tablet under the tongue daily., Disp: 90 tablet, Rfl: 3   dicyclomine  (BENTYL ) 10 MG capsule, Take 1 capsule (10 mg total) by mouth 3 (three) times daily as needed for spasms. Use prn in anticipation of a stressor that triggers abdominal pain., Disp: 30 capsule, Rfl: 1   famotidine  (PEPCID ) 20 MG tablet, Take 1 tablet (20 mg total) by mouth daily for 14 days., Disp: 14 tablet, Rfl: 0   gabapentin  (NEURONTIN ) 300 MG capsule, Take 1 capsule (300 mg total) by mouth 3 (three) times daily., Disp: 270 capsule, Rfl: 3   hydrOXYzine  (ATARAX ) 10 MG tablet, Take 1 tablet (10 mg total) by mouth 3 (three) times daily as needed for anxiety., Disp: 30 tablet, Rfl: 0   Ibuprofen  200 MG CAPS, , Disp: , Rfl:    pantoprazole  (PROTONIX ) 40 MG tablet, Take 1 tablet (40 mg total) by mouth daily. Take 30 minutes prior to eating breakfast, Disp: 30 tablet, Rfl: 3   rosuvastatin  (CRESTOR ) 10 MG tablet, Take 1 tablet (10 mg total) by mouth daily., Disp: 90 tablet, Rfl: 3  Observations/Objective: Patient is well-developed, well-nourished in no acute distress.  Resting comfortably  at home.  Head is normocephalic, atraumatic.  No labored breathing.  Speech is clear and coherent with logical content.  Patient is alert and oriented at baseline.    Assessment and Plan: 1. Moderate persistent  asthma without complication (Primary)  Increase fluids, humidifier at night, warm salt water gargles, tylenol , do in home covid test if sx persist.   Follow Up Instructions: I discussed the assessment and treatment plan with the patient. The patient was provided an opportunity to ask questions and all were answered. The patient agreed with the plan and demonstrated an understanding of the instructions.  A copy of instructions were sent to the patient via MyChart unless otherwise noted below.     The patient was advised to call back or seek an in-person evaluation if the symptoms worsen or if the condition fails to improve as anticipated.    Orlandria Kissner, FNP

## 2023-10-30 NOTE — Patient Instructions (Signed)

## 2023-11-01 NOTE — Telephone Encounter (Signed)
 VV 10/30/23

## 2023-11-03 ENCOUNTER — Ambulatory Visit: Payer: Self-pay

## 2023-11-03 NOTE — Telephone Encounter (Signed)
 FYI Only or Action Required?: FYI only for provider.  Patient was last seen in primary care on 10/30/2023 by Blair, Diane W, FNP.  Called Nurse Triage reporting Cough.  Symptoms began several days ago.  Interventions attempted: Prescription medications: prednisone .  Symptoms are: gradually improving.  Triage Disposition: See Physician Within 24 Hours (overriding Home Care)  Patient/caregiver understands and will follow disposition?:  Reason for Disposition  Cough  Answer Assessment - Initial Assessment Questions Patient called requesting appt, stating that she feels she needs more prednisone  because her cough has improved but not resolved. No availability in office for an appointment. Pt refused UC recommendation and disconnected the call.  1. ONSET: When did the cough begin?      10/30/23  2. SEVERITY: How bad is the cough today?      Mild improvement since televisit on 10/30/23  Protocols used: Cough - Acute Productive-A-AH Copied from CRM #[id number]. Topic: Clinical - Red Word Triage >> Nov 03, 2023  4:28 PM Lavanda D wrote: Red Word that prompted transfer to Nurse Triage: Triaged on 8/8 and still experiencing bad cough + discoloration of mucus (green). Patient is still experiencing a bad cough, she is out of the cough syrup and prednisone  that she had been prescribed at her recent visit on 8/8.    ----------------------------------------------------------------------- From previous Reason for Contact - Scheduling: Patient/patient representative is calling to schedule an appointment. Refer to attachments for appointment information.

## 2023-11-04 ENCOUNTER — Telehealth: Admitting: Physician Assistant

## 2023-11-04 ENCOUNTER — Ambulatory Visit: Payer: Self-pay

## 2023-11-04 ENCOUNTER — Ambulatory Visit

## 2023-11-04 DIAGNOSIS — J4541 Moderate persistent asthma with (acute) exacerbation: Secondary | ICD-10-CM | POA: Diagnosis not present

## 2023-11-04 MED ORDER — PREDNISONE 20 MG PO TABS: 40.0000 mg | ORAL_TABLET | Freq: Every day | ORAL | 0 refills | Status: DC

## 2023-11-04 NOTE — Progress Notes (Signed)
 Virtual Visit Consent   Evangeline G Frew, you are scheduled for a virtual visit with a [facility] provider today. Just as with appointments in the office, your consent must be obtained to participate. Your consent will be active for this visit and any virtual visit you may have with one of our providers in the next 365 days. If you have a MyChart account, a copy of this consent can be sent to you electronically.  As this is a virtual visit, video technology does not allow for your provider to perform a traditional examination. This may limit your provider's ability to fully assess your condition. If your provider identifies any concerns that need to be evaluated in person or the need to arrange testing (such as labs, EKG, etc.), we will make arrangements to do so. Although advances in technology are sophisticated, we cannot ensure that it will always work on either your end or our end. If the connection with a video visit is poor, the visit may have to be switched to a telephone visit. With either a video or telephone visit, we are not always able to ensure that we have a secure connection.  By engaging in this virtual visit, you consent to the provision of healthcare and authorize for your insurance to be billed (if applicable) for the services provided during this visit. Depending on your insurance coverage, you may receive a charge related to this service.  I need to obtain your verbal consent now. Are you willing to proceed with your visit today? Jimena G Ezelle has provided verbal consent on 11/04/2023 for a virtual visit (video or telephone). [name], PA-C  Date: 11/04/2023 12:41 PM   Virtual Visit via Video Note   I, [name], connected with  JALEIYAH ALAS  ([id number], January 20, 1976) on 11/04/23 at 12:45 PM EDT by a video-enabled telemedicine application and verified that I am speaking with the correct person using two identifiers.  Location: Patient: Virtual Visit Location  Patient: Home Provider: Virtual Visit Location Provider: Home Office   I discussed the limitations of evaluation and management by telemedicine and the availability of in person appointments. The patient expressed understanding and agreed to proceed.    History of Present Illness: Nicole Escobar is a 48 y.o. who identifies as a female who was assigned female at birth, and is being seen today for continued cough and chest congestion.  HPI: Cough This is a new problem. The current episode started 1 to 4 weeks ago (Seen Virtually on 10/30/23 and given Prednisone  and cough medication for a Viral URI with asthma exacerbation). The problem has been gradually improving (URI symptoms have improved but still with mild increased wheezing and cough). The problem occurs every few minutes. The cough is Productive of sputum. Associated symptoms include shortness of breath and wheezing. Pertinent negatives include no rhinorrhea. The symptoms are aggravated by lying down. She has tried oral steroids, prescription cough suppressant and a beta-agonist inhaler for the symptoms. The treatment provided no relief. Her past medical history is significant for asthma.     Problems:  Patient Active Problem List   Diagnosis Date Noted   LUQ pain 07/27/2023   Right ovarian cyst 07/27/2023   Abdominal bloating 06/15/2023   Atherosclerosis of aorta (HCC) 07/26/2022   Left low back pain 07/25/2022   Right shoulder pain 11/19/2021   Positive ANA (antinuclear antibody) 08/20/2021   Leg pain 08/13/2021   HTN (hypertension) 08/13/2021   Arthritis of knee 07/04/2021  PND (post-nasal drip) 06/18/2021   History of cervical polypectomy 04/25/2021   History of cervical dysplasia 04/25/2021   Wheezing 03/11/2021   Acute cough 03/11/2021   Viral upper respiratory tract infection 03/11/2021   Menorrhagia with irregular cycle 01/29/2021   Uterine mass 01/29/2021   Bilateral knee pain 08/28/2020   Arthralgia 08/28/2020    External hemorrhoid, thrombosed 07/26/2019   Asymptomatic microscopic hematuria 05/11/2019   Varicose veins of both lower extremities with pain 05/11/2019   Obesity (BMI 35.0-39.9 without comorbidity) 05/11/2019   B12 deficiency 04/27/2019   Polyarthralgia 04/27/2019   Grief reaction 02/08/2018   Lower abdominal pain 02/08/2018   Boil 02/08/2018   Elevated blood pressure reading 02/08/2018   Chest pain 02/08/2018   Left ear pain 11/18/2016   History of cocaine abuse (HCC) 04/03/2016   Bronchitis 06/05/2015   Influenza-like illness 05/14/2015   Benign paroxysmal positional vertigo 10/17/2014   Bilateral hand pain 05/16/2014   Bilateral carpal tunnel syndrome 05/16/2014   Stress headaches 03/16/2014    Allergies:  Allergies  Allergen Reactions   Sulfa Antibiotics Rash   Medications:  Current Outpatient Medications:    predniSONE  (DELTASONE ) 20 MG tablet, Take 2 tablets (40 mg total) by mouth daily with breakfast., Disp: 10 tablet, Rfl: 0   albuterol  (VENTOLIN  HFA) 108 (90 Base) MCG/ACT inhaler, Inhale 2 puffs into the lungs every 6 (six) hours as needed for wheezing or shortness of breath., Disp: 8 g, Rfl: 0   amLODipine  (NORVASC ) 2.5 MG tablet, Take 1 tablet (2.5 mg total) by mouth daily., Disp: 30 tablet, Rfl: 2   budesonide -formoterol  (SYMBICORT ) 80-4.5 MCG/ACT inhaler, Inhale 2 puffs into the lungs 2 (two) times daily., Disp: 1 each, Rfl: 12   Cyanocobalamin  (B-12) 1000 MCG SUBL, Place 1 tablet under the tongue daily., Disp: 90 tablet, Rfl: 3   dicyclomine  (BENTYL ) 10 MG capsule, Take 1 capsule (10 mg total) by mouth 3 (three) times daily as needed for spasms. Use prn in anticipation of a stressor that triggers abdominal pain., Disp: 30 capsule, Rfl: 1   famotidine  (PEPCID ) 20 MG tablet, Take 1 tablet (20 mg total) by mouth daily for 14 days., Disp: 14 tablet, Rfl: 0   gabapentin  (NEURONTIN ) 300 MG capsule, Take 1 capsule (300 mg total) by mouth 3 (three) times daily., Disp: 270  capsule, Rfl: 3   hydrOXYzine  (ATARAX ) 10 MG tablet, Take 1 tablet (10 mg total) by mouth 3 (three) times daily as needed for anxiety., Disp: 30 tablet, Rfl: 0   Ibuprofen  200 MG CAPS, , Disp: , Rfl:    pantoprazole  (PROTONIX ) 40 MG tablet, Take 1 tablet (40 mg total) by mouth daily. Take 30 minutes prior to eating breakfast, Disp: 30 tablet, Rfl: 3   promethazine -dextromethorphan (PROMETHAZINE -DM) 6.25-15 MG/5ML syrup, Take 5 mLs by mouth 4 (four) times daily as needed for cough., Disp: 118 mL, Rfl: 0   rosuvastatin  (CRESTOR ) 10 MG tablet, Take 1 tablet (10 mg total) by mouth daily., Disp: 90 tablet, Rfl: 3  Observations/Objective: Patient is well-developed, well-nourished in no acute distress.  Resting comfortably at home.  Head is normocephalic, atraumatic.  No labored breathing.  Speech is clear and coherent with logical content.  Patient is alert and oriented at baseline.    Assessment and Plan: 1. Moderate persistent asthma with acute exacerbation (Primary) - predniSONE  (DELTASONE ) 20 MG tablet; Take 2 tablets (40 mg total) by mouth daily with breakfast.  Dispense: 10 tablet; Refill: 0  - Prednisone  refilled as above - Continue  at home inhalers as prescribed - Can add Mucinex  - Steam and humidifier can help - Seek in person evaluation if not improving or if symptoms worsen  Follow Up Instructions: I discussed the assessment and treatment plan with the patient. The patient was provided an opportunity to ask questions and all were answered. The patient agreed with the plan and demonstrated an understanding of the instructions.  A copy of instructions were sent to the patient via MyChart unless otherwise noted below.    The patient was advised to call back or seek an in-person evaluation if the symptoms worsen or if the condition fails to improve as anticipated.    [name], PA-C

## 2023-11-04 NOTE — Telephone Encounter (Signed)
 Spoke to pt she will do telehealth visit

## 2023-11-04 NOTE — Telephone Encounter (Signed)
  FYI Only or Action Required?: Action required by provider: Patient requesting more prednisone .  Patient was last seen in primary care on 10/30/2023 by Blair, Diane W, FNP.  Called Nurse Triage reporting Medication Problem.  Symptoms began a week ago.  Interventions attempted: Prescription medications: Prednisone .  Symptoms are: stable.  Triage Disposition: Call PCP Within 24 Hours  Patient/caregiver understands and will follow disposition?: Yes        Copied from CRM #[id number]. Topic: Clinical - Prescription Issue >> Nov 04, 2023  8:42 AM Nicole Escobar wrote: Reason for CRM: patient stated she started to feel a little better but would like a refill for predniSONE  (DELTASONE ) 20 MG tablet [[id number]] she still having symptoms of coughing. Reason for Disposition  [1] Follow-up call from patient regarding patient's clinical status AND [2] information NON-URGENT  Answer Assessment - Initial Assessment Questions 1. REASON FOR CALL or QUESTION: What is your reason for calling today? or How can I best     Patient calling stating she is requesting a few days more of prednisone  because her symptoms are not all the way gone but have improved.  2. CALLER: Document the source of call. (e.g., laboratory staff, caregiver or patient).     Patient called.  Protocols used: PCP Call - No Triage-A-AH

## 2023-11-04 NOTE — Telephone Encounter (Signed)
 Pt is going to do telehealth visit again

## 2023-11-04 NOTE — Telephone Encounter (Signed)
 Spoke to pt she had Telehealth visit on 10/30/23 would like to know if she can get more prednisone , can you send in or does she need to reach back out to provider that originally prescribed

## 2023-11-04 NOTE — Patient Instructions (Signed)
 Nicole Escobar, thank you for joining Nicole CHRISTELLA Dickinson, PA-C for today's virtual visit.  While this provider is not your primary care provider (PCP), if your PCP is located in our provider database this encounter information will be shared with them immediately following your visit.   A [facility] MyChart account gives you access to today's visit and all your visits, tests, and labs performed at [facility]  click here if you don't have a [facility] MyChart account or go to [email or web address]  Consent: (Patient) Nicole Escobar provided verbal consent for this virtual visit at the beginning of the encounter.  Current Medications:  Current Outpatient Medications:    predniSONE  (DELTASONE ) 20 MG tablet, Take 2 tablets (40 mg total) by mouth daily with breakfast., Disp: 10 tablet, Rfl: 0   albuterol  (VENTOLIN  HFA) 108 (90 Base) MCG/ACT inhaler, Inhale 2 puffs into the lungs every 6 (six) hours as needed for wheezing or shortness of breath., Disp: 8 g, Rfl: 0   amLODipine  (NORVASC ) 2.5 MG tablet, Take 1 tablet (2.5 mg total) by mouth daily., Disp: 30 tablet, Rfl: 2   budesonide -formoterol  (SYMBICORT ) 80-4.5 MCG/ACT inhaler, Inhale 2 puffs into the lungs 2 (two) times daily., Disp: 1 each, Rfl: 12   Cyanocobalamin  (B-12) 1000 MCG SUBL, Place 1 tablet under the tongue daily., Disp: 90 tablet, Rfl: 3   dicyclomine  (BENTYL ) 10 MG capsule, Take 1 capsule (10 mg total) by mouth 3 (three) times daily as needed for spasms. Use prn in anticipation of a stressor that triggers abdominal pain., Disp: 30 capsule, Rfl: 1   famotidine  (PEPCID ) 20 MG tablet, Take 1 tablet (20 mg total) by mouth daily for 14 days., Disp: 14 tablet, Rfl: 0   gabapentin  (NEURONTIN ) 300 MG capsule, Take 1 capsule (300 mg total) by mouth 3 (three) times daily., Disp: 270 capsule, Rfl: 3   hydrOXYzine  (ATARAX ) 10 MG tablet, Take 1 tablet (10 mg total) by mouth 3 (three) times daily as needed for anxiety., Disp: 30  tablet, Rfl: 0   Ibuprofen  200 MG CAPS, , Disp: , Rfl:    pantoprazole  (PROTONIX ) 40 MG tablet, Take 1 tablet (40 mg total) by mouth daily. Take 30 minutes prior to eating breakfast, Disp: 30 tablet, Rfl: 3   promethazine -dextromethorphan (PROMETHAZINE -DM) 6.25-15 MG/5ML syrup, Take 5 mLs by mouth 4 (four) times daily as needed for cough., Disp: 118 mL, Rfl: 0   rosuvastatin  (CRESTOR ) 10 MG tablet, Take 1 tablet (10 mg total) by mouth daily., Disp: 90 tablet, Rfl: 3   Medications ordered in this encounter:  Meds ordered this encounter  Medications   predniSONE  (DELTASONE ) 20 MG tablet    Sig: Take 2 tablets (40 mg total) by mouth daily with breakfast.    Dispense:  10 tablet    Refill:  0    Supervising Provider:   BLAISE ALEENE KIDD [[id number]]     *If you need refills on other medications prior to your next appointment, please contact your pharmacy*  Follow-Up: Call back or seek an in-person evaluation if the symptoms worsen or if the condition fails to improve as anticipated.  [facility] [phone]  Other Instructions Asthma, Adult  Asthma is a long-term (chronic) condition that causes recurrent episodes in which the lower airways in the lungs become tight and narrow. The narrowing is caused by inflammation and tightening of the smooth muscle around the lower airways. Asthma episodes, also called asthma attacks or asthma flares, may cause coughing, making high-pitched  whistling sounds when you breathe, most often when you breathe out (wheezing), shortness of breath, and chest pain. The airways may produce extra mucus caused by the inflammation and irritation. During an attack, it can be difficult to breathe. Asthma attacks can range from minor to life-threatening. Asthma cannot be cured, but medicines and lifestyle changes can help control it and treat acute attacks. It is important to keep your asthma well controlled so the condition does not interfere with your daily  life. What are the causes? This condition is believed to be caused by inherited (genetic) and environmental factors, but its exact cause is not known. What can trigger an asthma attack? Many things can bring on an asthma attack or make symptoms worse. These triggers are different for every person. Common triggers include: Allergens and irritants like mold, dust, pet dander, cockroaches, pollen, air pollution, and chemical odors. Cigarette smoke. Weather changes and cold air. Stress and strong emotional responses such as crying or laughing hard. Certain medications such as aspirin or beta blockers. Infections and inflammatory conditions, such as the flu, a cold, pneumonia, or inflammation of the nasal membranes (rhinitis). Gastroesophageal reflux disease (GERD). What are the signs or symptoms? Symptoms may occur right after exposure to an asthma trigger or hours later and can vary by person. Common signs and symptoms include: Wheezing. Trouble breathing (shortness of breath). Excessive nighttime or early morning coughing. Chest tightness. Tiredness (fatigue) with minimal activity. Difficulty talking in complete sentences. Poor exercise tolerance. How is this diagnosed? This condition is diagnosed based on: A physical exam and your medical history. Tests, which may include: Lung function studies to evaluate the flow of air in your lungs. Allergy tests. Imaging tests, such as X-rays. How is this treated? There is no cure, but symptoms can be controlled with proper treatment. Treatment usually involves: Identifying and avoiding your asthma triggers. Inhaled medicines. Two types are commonly used to treat asthma, depending on severity: Controller medicines. These help prevent asthma symptoms from occurring. They are taken every day. Fast-acting reliever or rescue medicines. These quickly relieve asthma symptoms. They are used as needed and provide short-term relief. Using other  medicines, such as: Allergy medicines, such as antihistamines, if your asthma attacks are triggered by allergens. Immune medicines (immunomodulators). These are medicines that help control the immune system. Using supplemental oxygen. This is only needed during a severe episode. Creating an asthma action plan. An asthma action plan is a written plan for managing and treating your asthma attacks. This plan includes: A list of your asthma triggers and how to avoid them. Information about when medicines should be taken and when their dosage should be changed. Instructions about using a device called a peak flow meter. A peak flow meter measures how well the lungs are working and the severity of your asthma. It helps you monitor your condition. Follow these instructions at home: Take over-the-counter and prescription medicines only as told by your health care provider. Stay up to date on all vaccinations as recommended by your healthcare provider, including vaccines for the flu and pneumonia. Use a peak flow meter and keep track of your peak flow readings. Understand and use your asthma action plan to address any asthma flares. Do not smoke or allow anyone to smoke in your home. Contact a health care provider if: You have wheezing, shortness of breath, or a cough that is not responding to medicines. Your medicines are causing side effects, such as a rash, itching, swelling, or trouble breathing. You  need to use a reliever medicine more than 2-3 times a week. Your peak flow reading is still at 50-79% of your personal best after following your action plan for 1 hour. You have a fever and shortness of breath. Get help right away if: You are getting worse and do not respond to treatment during an asthma attack. You are short of breath when at rest or when doing very little physical activity. You have difficulty eating, drinking, or talking. You have chest pain or tightness. You develop a fast  heartbeat or palpitations. You have a bluish color to your lips or fingernails. You are light-headed or dizzy, or you faint. Your peak flow reading is less than 50% of your personal best. You feel too tired to breathe normally. These symptoms may be an emergency. Get help right away. Call 911. Do not wait to see if the symptoms will go away. Do not drive yourself to the hospital. Summary Asthma is a long-term (chronic) condition that causes recurrent episodes in which the airways become tight and narrow. Asthma episodes, also called asthma attacks or asthma flares, can cause coughing, wheezing, shortness of breath, and chest pain. Asthma cannot be cured, but medicines and lifestyle changes can help keep it well controlled and prevent asthma flares. Make sure you understand how to avoid triggers and how and when to use your medicines. Asthma attacks can range from minor to life-threatening. Get help right away if you have an asthma attack and do not respond to treatment with your usual rescue medicines. This information is not intended to replace advice given to you by your health care provider. Make sure you discuss any questions you have with your health care provider. Document Revised: 12/26/2020 Document Reviewed: 12/17/2020 Elsevier Patient Education  2024 [employer]   If you have been instructed to have an in-person evaluation today at a local Urgent Care facility, please use the link below. It will take you to a list of all of our available [facility], including address, phone number and hours of operation. Please do not delay care.  [facility]  If you or a family member do not have a primary care provider, use the link below to schedule a visit and establish care. When you choose a [facility] primary care physician or advanced practice provider, you gain a long-term partner in health. Find a Primary Care Provider  Learn more about [facility]'s  in-office and virtual care options: [facility] - Get Care Now

## 2023-11-05 ENCOUNTER — Telehealth: Admitting: Physician Assistant

## 2023-11-05 ENCOUNTER — Encounter

## 2023-11-05 DIAGNOSIS — J40 Bronchitis, not specified as acute or chronic: Secondary | ICD-10-CM | POA: Diagnosis not present

## 2023-11-05 MED ORDER — BENZONATATE 100 MG PO CAPS: 100.0000 mg | ORAL_CAPSULE | Freq: Three times a day (TID) | ORAL | 0 refills | Status: DC | PRN

## 2023-11-05 MED ORDER — AZITHROMYCIN 250 MG PO TABS: ORAL_TABLET | ORAL | 0 refills | Status: AC

## 2023-11-05 MED ORDER — ALBUTEROL SULFATE HFA 108 (90 BASE) MCG/ACT IN AERS: 2.0000 | INHALATION_SPRAY | Freq: Four times a day (QID) | RESPIRATORY_TRACT | 0 refills | Status: AC | PRN

## 2023-11-05 NOTE — Patient Instructions (Signed)
 Nicole Escobar, thank you for joining Nicole Velma Lunger, PA-C for today's virtual visit.  While this provider is not your primary care provider (PCP), if your PCP is located in our provider database this encounter information will be shared with them immediately following your visit.   A [facility] MyChart account gives you access to today's visit and all your visits, tests, and labs performed at [facility]  click here if you don't have a  MyChart account or go to [email or web address]  Consent: (Patient) Nicole Escobar provided verbal consent for this virtual visit at the beginning of the encounter.  Current Medications:  Current Outpatient Medications:    albuterol  (VENTOLIN  HFA) 108 (90 Base) MCG/ACT inhaler, Inhale 2 puffs into the lungs every 6 (six) hours as needed for wheezing or shortness of breath., Disp: 8 g, Rfl: 0   amLODipine  (NORVASC ) 2.5 MG tablet, Take 1 tablet (2.5 mg total) by mouth daily., Disp: 30 tablet, Rfl: 2   budesonide -formoterol  (SYMBICORT ) 80-4.5 MCG/ACT inhaler, Inhale 2 puffs into the lungs 2 (two) times daily., Disp: 1 each, Rfl: 12   Cyanocobalamin  (B-12) 1000 MCG SUBL, Place 1 tablet under the tongue daily., Disp: 90 tablet, Rfl: 3   dicyclomine  (BENTYL ) 10 MG capsule, Take 1 capsule (10 mg total) by mouth 3 (three) times daily as needed for spasms. Use prn in anticipation of a stressor that triggers abdominal pain., Disp: 30 capsule, Rfl: 1   famotidine  (PEPCID ) 20 MG tablet, Take 1 tablet (20 mg total) by mouth daily for 14 days., Disp: 14 tablet, Rfl: 0   gabapentin  (NEURONTIN ) 300 MG capsule, Take 1 capsule (300 mg total) by mouth 3 (three) times daily., Disp: 270 capsule, Rfl: 3   hydrOXYzine  (ATARAX ) 10 MG tablet, Take 1 tablet (10 mg total) by mouth 3 (three) times daily as needed for anxiety., Disp: 30 tablet, Rfl: 0   Ibuprofen  200 MG CAPS, , Disp: , Rfl:    pantoprazole  (PROTONIX ) 40 MG tablet, Take 1 tablet (40 mg total)  by mouth daily. Take 30 minutes prior to eating breakfast, Disp: 30 tablet, Rfl: 3   predniSONE  (DELTASONE ) 20 MG tablet, Take 2 tablets (40 mg total) by mouth daily with breakfast., Disp: 10 tablet, Rfl: 0   promethazine -dextromethorphan (PROMETHAZINE -DM) 6.25-15 MG/5ML syrup, Take 5 mLs by mouth 4 (four) times daily as needed for cough., Disp: 118 mL, Rfl: 0   rosuvastatin  (CRESTOR ) 10 MG tablet, Take 1 tablet (10 mg total) by mouth daily., Disp: 90 tablet, Rfl: 3   Medications ordered in this encounter:  No orders of the defined types were placed in this encounter.    *If you need refills on other medications prior to your next appointment, please contact your pharmacy*  Follow-Up: Call back or seek an in-person evaluation if the symptoms worsen or if the condition fails to improve as anticipated.  [facility] [phone]  Other Instructions Take antibiotic (Azithromycin ) as directed.  Increase fluids.  Get plenty of rest. Use Mucinex  for congestion. I have refilled your albuterol  and sent in a cough medication. Continue your asthma medications. Take a daily probiotic (I recommend Align or Culturelle, but even Activia Yogurt may be beneficial).  A humidifier placed in the bedroom may offer some relief for a dry, scratchy throat of nasal irritation.  Read information below on acute bronchitis. If you note any non-resolving, new, or worsening symptoms despite treatment, please seek an in-person evaluation ASAP.   Acute Bronchitis Bronchitis is  when the airways that extend from the windpipe into the lungs get red, puffy, and painful (inflamed). Bronchitis often causes thick spit (mucus) to develop. This leads to a cough. A cough is the most common symptom of bronchitis. In acute bronchitis, the condition usually begins suddenly and goes away over time (usually in 2 weeks). Smoking, allergies, and asthma can make bronchitis worse. Repeated episodes of bronchitis may cause more  lung problems.  HOME CARE Rest. Drink enough fluids to keep your pee (urine) clear or pale yellow (unless you need to limit fluids as told by your doctor). Only take over-the-counter or prescription medicines as told by your doctor. Avoid smoking and secondhand smoke. These can make bronchitis worse. If you are a smoker, think about using nicotine gum or skin patches. Quitting smoking will help your lungs heal faster. Reduce the chance of getting bronchitis again by: Washing your hands often. Avoiding people with cold symptoms. Trying not to touch your hands to your mouth, nose, or eyes. Follow up with your doctor as told.  GET HELP IF: Your symptoms do not improve after 1 week of treatment. Symptoms include: Cough. Fever. Coughing up thick spit. Body aches. Chest congestion. Chills. Shortness of breath. Sore throat.  GET HELP RIGHT AWAY IF:  You have an increased fever. You have chills. You have severe shortness of breath. You have bloody thick spit (sputum). You throw up (vomit) often. You lose too much body fluid (dehydration). You have a severe headache. You faint.  MAKE SURE YOU:  Understand these instructions. Will watch your condition. Will get help right away if you are not doing well or get worse. Document Released: 08/27/2007 Document Revised: 11/10/2012 Document Reviewed: 08/31/2012 [facility] Patient Information 2015 [place], [place]. This information is not intended to replace advice given to you by your health care provider. Make sure you discuss any questions you have with your health care provider.    If you have been instructed to have an in-person evaluation today at a local Urgent Care facility, please use the link below. It will take you to a list of all of our available [facility], including address, phone number and hours of operation. Please do not delay care.  [facility]  If you or a family member do not have a primary  care provider, use the link below to schedule a visit and establish care. When you choose a [facility] primary care physician or advanced practice provider, you gain a long-term partner in health. Find a Primary Care Provider  Learn more about [facility]'s in-office and virtual care options: [facility] - Get Care Now

## 2023-11-05 NOTE — Progress Notes (Signed)
 Virtual Visit Consent   Nicole Escobar, you are scheduled for a virtual visit with a [facility] provider today. Just as with appointments in the office, your consent must be obtained to participate. Your consent will be active for this visit and any virtual visit you may have with one of our providers in the next 365 days. If you have a MyChart account, a copy of this consent can be sent to you electronically.  As this is a virtual visit, video technology does not allow for your provider to perform a traditional examination. This may limit your provider's ability to fully assess your condition. If your provider identifies any concerns that need to be evaluated in person or the need to arrange testing (such as labs, EKG, etc.), we will make arrangements to do so. Although advances in technology are sophisticated, we cannot ensure that it will always work on either your end or our end. If the connection with a video visit is poor, the visit may have to be switched to a telephone visit. With either a video or telephone visit, we are not always able to ensure that we have a secure connection.  By engaging in this virtual visit, you consent to the provision of healthcare and authorize for your insurance to be billed (if applicable) for the services provided during this visit. Depending on your insurance coverage, you may receive a charge related to this service.  I need to obtain your verbal consent now. Are you willing to proceed with your visit today? Jacquel G Bartell has provided verbal consent on 11/05/2023 for a virtual visit (video or telephone). Nicole Escobar, [place]  Date: 11/05/2023 5:03 PM   Virtual Visit via Video Note   I, Nicole Escobar, connected with  LISA-MARIE RUEGER  ([id number], 01-Sep-1975) on 11/05/23 at  5:00 PM EDT by a video-enabled telemedicine application and verified that I am speaking with the correct person using two identifiers.  Location: Patient: Virtual Visit Location  Patient: Home Provider: Virtual Visit Location Provider: Home Office   I discussed the limitations of evaluation and management by telemedicine and the availability of in person appointments. The patient expressed understanding and agreed to proceed.    History of Present Illness: KEALIE BARRIE is a 48 y.o. who identifies as a female who was assigned female at birth, and is being seen today for continued URI symptoms. Was evaluated 8/8 and diagnosed with Viral URI and asthma exacerbation, giving cough syrup and course of prednisone  with substantial improvement in symptoms. Symptoms did not resolve fully and started worsening again so was seen yesterday on 8/13 and given an extension of her prednisone  course. Notes that helps with her tightness and wheezing but she is continuing to cough with increase in chest congestion. Denies fever, chills. Albuterol  was helping to break up congestion but she is now out of her inhaler. Is requesting ABX.   HPI: HPI  Problems:  Patient Active Problem List   Diagnosis Date Noted   LUQ pain 07/27/2023   Right ovarian cyst 07/27/2023   Abdominal bloating 06/15/2023   Atherosclerosis of aorta (HCC) 07/26/2022   Left low back pain 07/25/2022   Right shoulder pain 11/19/2021   Positive ANA (antinuclear antibody) 08/20/2021   Leg pain 08/13/2021   HTN (hypertension) 08/13/2021   Arthritis of knee 07/04/2021   PND (post-nasal drip) 06/18/2021   History of cervical polypectomy 04/25/2021   History of cervical dysplasia 04/25/2021   Wheezing 03/11/2021   Acute  cough 03/11/2021   Viral upper respiratory tract infection 03/11/2021   Menorrhagia with irregular cycle 01/29/2021   Uterine mass 01/29/2021   Bilateral knee pain 08/28/2020   Arthralgia 08/28/2020   External hemorrhoid, thrombosed 07/26/2019   Asymptomatic microscopic hematuria 05/11/2019   Varicose veins of both lower extremities with pain 05/11/2019   Obesity (BMI 35.0-39.9 without comorbidity)  05/11/2019   B12 deficiency 04/27/2019   Polyarthralgia 04/27/2019   Grief reaction 02/08/2018   Lower abdominal pain 02/08/2018   Boil 02/08/2018   Elevated blood pressure reading 02/08/2018   Chest pain 02/08/2018   Left ear pain 11/18/2016   History of cocaine abuse (HCC) 04/03/2016   Bronchitis 06/05/2015   Influenza-like illness 05/14/2015   Benign paroxysmal positional vertigo 10/17/2014   Bilateral hand pain 05/16/2014   Bilateral carpal tunnel syndrome 05/16/2014   Stress headaches 03/16/2014    Allergies:  Allergies  Allergen Reactions   Sulfa Antibiotics Rash   Medications:  Current Outpatient Medications:    azithromycin  (ZITHROMAX ) 250 MG tablet, Take 2 tablets on day 1, then 1 tablet daily on days 2 through 5, Disp: 6 tablet, Rfl: 0   benzonatate  (TESSALON ) 100 MG capsule, Take 1 capsule (100 mg total) by mouth 3 (three) times daily as needed for cough., Disp: 30 capsule, Rfl: 0   albuterol  (VENTOLIN  HFA) 108 (90 Base) MCG/ACT inhaler, Inhale 2 puffs into the lungs every 6 (six) hours as needed for wheezing or shortness of breath., Disp: 8 g, Rfl: 0   amLODipine  (NORVASC ) 2.5 MG tablet, Take 1 tablet (2.5 mg total) by mouth daily., Disp: 30 tablet, Rfl: 2   budesonide -formoterol  (SYMBICORT ) 80-4.5 MCG/ACT inhaler, Inhale 2 puffs into the lungs 2 (two) times daily., Disp: 1 each, Rfl: 12   Cyanocobalamin  (B-12) 1000 MCG SUBL, Place 1 tablet under the tongue daily., Disp: 90 tablet, Rfl: 3   dicyclomine  (BENTYL ) 10 MG capsule, Take 1 capsule (10 mg total) by mouth 3 (three) times daily as needed for spasms. Use prn in anticipation of a stressor that triggers abdominal pain., Disp: 30 capsule, Rfl: 1   famotidine  (PEPCID ) 20 MG tablet, Take 1 tablet (20 mg total) by mouth daily for 14 days., Disp: 14 tablet, Rfl: 0   gabapentin  (NEURONTIN ) 300 MG capsule, Take 1 capsule (300 mg total) by mouth 3 (three) times daily., Disp: 270 capsule, Rfl: 3   hydrOXYzine  (ATARAX ) 10 MG  tablet, Take 1 tablet (10 mg total) by mouth 3 (three) times daily as needed for anxiety., Disp: 30 tablet, Rfl: 0   Ibuprofen  200 MG CAPS, , Disp: , Rfl:    pantoprazole  (PROTONIX ) 40 MG tablet, Take 1 tablet (40 mg total) by mouth daily. Take 30 minutes prior to eating breakfast, Disp: 30 tablet, Rfl: 3   predniSONE  (DELTASONE ) 20 MG tablet, Take 2 tablets (40 mg total) by mouth daily with breakfast., Disp: 10 tablet, Rfl: 0   rosuvastatin  (CRESTOR ) 10 MG tablet, Take 1 tablet (10 mg total) by mouth daily., Disp: 90 tablet, Rfl: 3  Observations/Objective: Patient is well-developed, well-nourished in no acute distress.  Resting comfortably at home.  Head is normocephalic, atraumatic.  No labored breathing.  Speech is clear and coherent with logical content.  Patient is alert and oriented at baseline.   Assessment and Plan: 1. Bronchitis - azithromycin  (ZITHROMAX ) 250 MG tablet; Take 2 tablets on day 1, then 1 tablet daily on days 2 through 5  Dispense: 6 tablet; Refill: 0 - benzonatate  (TESSALON ) 100 MG capsule;  Take 1 capsule (100 mg total) by mouth 3 (three) times daily as needed for cough.  Dispense: 30 capsule; Refill: 0 - albuterol  (VENTOLIN  HFA) 108 (90 Base) MCG/ACT inhaler; Inhale 2 puffs into the lungs every 6 (six) hours as needed for wheezing or shortness of breath.  Dispense: 8 g; Refill: 0  Giving persistent symptoms with change in sputum, will add-on antibiotic -- azithromycin  -- to take as directed. Albuterol  refilled and Tessalon  per orders for cough. Continue Symbicort  and course of prednisone . She is aware no further treatment for this issue will be given via virtual care. She will require in-person evaluation with her PCP or at local UC or ER if symptoms are not resolving.  Follow Up Instructions: I discussed the assessment and treatment plan with the patient. The patient was provided an opportunity to ask questions and all were answered. The patient agreed with the plan  and demonstrated an understanding of the instructions.  A copy of instructions were sent to the patient via MyChart unless otherwise noted below.   The patient was advised to call back or seek an in-person evaluation if the symptoms worsen or if the condition fails to improve as anticipated.    Nicole Velma Lunger, PA-C

## 2023-11-24 ENCOUNTER — Ambulatory Visit: Payer: Self-pay

## 2023-11-24 ENCOUNTER — Ambulatory Visit (INDEPENDENT_AMBULATORY_CARE_PROVIDER_SITE_OTHER)

## 2023-11-24 VITALS — BP 142/82 | HR 70 | Temp 97.8°F | Ht 63.0 in | Wt 225.8 lb

## 2023-11-24 DIAGNOSIS — K120 Recurrent oral aphthae: Secondary | ICD-10-CM | POA: Insufficient documentation

## 2023-11-24 DIAGNOSIS — J40 Bronchitis, not specified as acute or chronic: Secondary | ICD-10-CM

## 2023-11-24 DIAGNOSIS — B370 Candidal stomatitis: Secondary | ICD-10-CM | POA: Insufficient documentation

## 2023-11-24 MED ORDER — VALACYCLOVIR HCL 1 G PO TABS: 1000.0000 mg | ORAL_TABLET | Freq: Two times a day (BID) | ORAL | 0 refills | Status: AC

## 2023-11-24 MED ORDER — BUDESONIDE-FORMOTEROL FUMARATE 80-4.5 MCG/ACT IN AERO: 2.0000 | INHALATION_SPRAY | Freq: Two times a day (BID) | RESPIRATORY_TRACT | 0 refills | Status: AC

## 2023-11-24 MED ORDER — NYSTATIN 100000 UNIT/ML MT SUSP: 5.0000 mL | Freq: Four times a day (QID) | OROMUCOSAL | 0 refills | Status: DC

## 2023-11-24 NOTE — Assessment & Plan Note (Signed)
 Symptoms improving.  Prescribed new Symbicort  inhaler 2 puffs, twice daily. Rinse the mouth with water and spit after each use of Symbicort .Avoid eating or drinking immediately after inhaler.  F/u with PCP in 2 weeks for reevaluation.

## 2023-11-24 NOTE — Patient Instructions (Addendum)
-   Take B 12 over the counter 2500 mcg daily.  - Start Nystatin  swiss and swallow, 5 ml 4 times a day. Swish in the mouth for several minutes, then swallow.  - Take Valtrex  1 tab twice a day for 5 days.  - Use Symbicort  for worsening wheezing, cough. Please rinse the mouth with water and spit after each use of Symbicort .  - Follow up with PCP in 2 weeks.

## 2023-11-24 NOTE — Assessment & Plan Note (Addendum)
 D/D includes herpetic stomatitis, nutritional deficiencies ( b12, iron, folate), autoimmunize, inflammatory, drug induced.  Recommend trial of Nystatin  swiss and swallow, 4 times a day.  Valacyclovir  1,000 mg BID for 5 days.  Recommended Tylenol  500 mg every 8 hourly ibuprofen  400 mg prn every 8 hourly with food for pain management. F/U with PCP in about 2 weeks to discuss chronic medication management as patient is not taking medications as prescribed.

## 2023-11-24 NOTE — Progress Notes (Signed)
 Acute Office Visit  Subjective:    Patient ID: Nicole Escobar, female    DOB: October 03, 1975, 48 y.o.   MRN: [id number]  Chief Complaint  Patient presents with   Oral Pain   Blister        Patient is in today for following acute concerns HPI Discussed the use of AI scribe software for clinical note transcription with the patient, who gave verbal consent to proceed.  History of Present Illness Nicole Escobar is a 48 year old female who presents with painful oral lesions following diagnosis and treatment for bronchitis.   For bronchitis she was treated with antibiotic, prednisone , and a new albuterol  inhaler. She also used an older Symbicort  inhaler. Cough, wheezing improving.   She describes painful oral lesions, noting a large and a small white spot on her tongue. She has a history of thrush and cold sores. No fever was reported, but she has been feeling sick.  She has not been taking any medication regularly except for gabapentin  for arthritis in both knees. Her bronchitis symptoms have slightly improved, but she still experiences wheezing, especially at night, which prompts her to use her inhaler.  She has a history of B12 deficiency and has not had a B12 shot for the last few months, which she attributes to her busy schedule and personal circumstances, including the death of her husband two years ago.   She stopped smoking 13 years ago.   ROS As per HPI    Objective:    BP (!) 142/82 (BP Location: Right Arm, Patient Position: Sitting, Cuff Size: Normal)   Pulse 70   Temp 97.8 F (36.6 C) (Oral)   Ht 5' 3 (1.6 m)   Wt 225 lb 12.8 oz (102.4 kg)   SpO2 98%   BMI 40.00 kg/m     Physical Exam HENT:     Head: Normocephalic and atraumatic.     Right Ear: Tympanic membrane and ear canal normal. There is no impacted cerumen.     Left Ear: Tympanic membrane and ear canal normal. There is no impacted cerumen.     Mouth/Throat:     Mouth: Mucous membranes are moist.      Comments: Right lateral oral mucosa/tongue about 4 mm diameter, oval ulcer with a white base without erythema, discharge, no vesicles, secondary infection. No cervical lymphadenopathy.  Cardiovascular:     Rate and Rhythm: Normal rate.  Pulmonary:     Breath sounds: Wheezing (b/l mild inspiratory wheezes, no crackles) present.  Lymphadenopathy:     Cervical: No cervical adenopathy.  Neurological:     Mental Status: She is alert and oriented to person, place, and time.  Psychiatric:        Mood and Affect: Mood normal.     No results found for any visits on 11/24/23.     Assessment & Plan:   Aphthous ulcer Assessment & Plan: D/D includes herpetic stomatitis, nutritional deficiencies ( b12, iron, folate), autoimmunize, inflammatory, drug induced.  Recommend trial of Nystatin  swiss and swallow, 4 times a day.  Valacyclovir  1,000 mg BID for 5 days.  Recommended Tylenol  500 mg every 8 hourly ibuprofen  400 mg prn every 8 hourly with food for pain management. F/U with PCP in about 2 weeks to discuss chronic medication management as patient is not taking medications as prescribed.    Orders: -     Nystatin ; Take 5 mLs (500,000 Units total) by mouth 4 (four) times daily.  Dispense: 473 mL;  Refill: 0 -     valACYclovir  HCl; Take 1 tablet (1,000 mg total) by mouth 2 (two) times daily for 5 days.  Dispense: 10 tablet; Refill: 0  Bronchitis Assessment & Plan: Symptoms improving.  Prescribed new Symbicort  inhaler 2 puffs, twice daily. Rinse the mouth with water and spit after each use of Symbicort .Avoid eating or drinking immediately after inhaler.  F/u with PCP in 2 weeks for reevaluation.   Orders: -     Budesonide -Formoterol  Fumarate; Inhale 2 puffs into the lungs 2 (two) times daily.  Dispense: 1 each; Refill: 0    Return in about 2 weeks (around 12/08/2023) for Follow up with PCP for chronic concerns .  Luke Shade, MD

## 2023-11-24 NOTE — Telephone Encounter (Signed)
 FYI Only or Action Required?: FYI only for provider.  Patient was last seen in primary care on 10/30/2023 by Blair, Diane W, FNP.  Called Nurse Triage reporting Mouth Lesions. Lump on tongue - painful  Symptoms began yesterday.  Interventions attempted: Nothing.  Symptoms are: gradually worsening.  Triage Disposition: See Today in Office  Patient/caregiver understands and will follow disposition?: Yes               Copied from CRM #[id number]. Topic: Clinical - Red Word Triage >> Nov 24, 2023 11:39 AM Dedra NOVAK wrote: [facility] that prompted transfer to Nurse Triage: Pt calling in saying that she has big bump on her tongue. Bump is painful and tongue is swollen. Warm transfer to [facility] nurse triage. Answer Assessment - Initial Assessment Questions 1. SYMPTOM: What's the main symptom you're concerned about? (e.g., chapped lips, dry mouth, lump, sores)     Big lump on tongue  - yellow looks like a burn 2. ONSET: When did the  lump  start?     Sunday 3. PAIN: Is there any pain? If Yes, ask: How bad is it? (Scale: 0-10; or none, mild, moderate, severe)     Yes 4. CAUSE: What do you think is causing the symptoms?     unsure 5. OTHER SYMPTOMS: Do you have any other symptoms? (e.g., fever, sore throat, toothache, swelling)     Looks burned  Protocols used: Mouth Symptoms-A-AH

## 2023-12-09 ENCOUNTER — Telehealth: Payer: Self-pay

## 2023-12-09 ENCOUNTER — Ambulatory Visit (INDEPENDENT_AMBULATORY_CARE_PROVIDER_SITE_OTHER): Admitting: Family

## 2023-12-09 VITALS — BP 150/94 | HR 90 | Temp 97.7°F | Ht 64.0 in | Wt 224.8 lb

## 2023-12-09 DIAGNOSIS — K120 Recurrent oral aphthae: Secondary | ICD-10-CM

## 2023-12-09 DIAGNOSIS — J40 Bronchitis, not specified as acute or chronic: Secondary | ICD-10-CM | POA: Diagnosis not present

## 2023-12-09 DIAGNOSIS — I10 Essential (primary) hypertension: Secondary | ICD-10-CM

## 2023-12-09 DIAGNOSIS — E538 Deficiency of other specified B group vitamins: Secondary | ICD-10-CM

## 2023-12-09 DIAGNOSIS — M79641 Pain in right hand: Secondary | ICD-10-CM

## 2023-12-09 DIAGNOSIS — M79642 Pain in left hand: Secondary | ICD-10-CM

## 2023-12-09 MED ORDER — AMOXICILLIN-POT CLAVULANATE 875-125 MG PO TABS: 1.0000 | ORAL_TABLET | Freq: Two times a day (BID) | ORAL | 0 refills | Status: AC

## 2023-12-09 MED ORDER — CYANOCOBALAMIN 1000 MCG/ML IJ SOLN
1000.0000 ug | Freq: Once | INTRAMUSCULAR | Status: AC
  Administered 2023-12-09: 1000 ug via INTRAMUSCULAR

## 2023-12-09 MED ORDER — AMLODIPINE BESYLATE 2.5 MG PO TABS: 2.5000 mg | ORAL_TABLET | Freq: Every day | ORAL | 2 refills | Status: AC

## 2023-12-09 NOTE — Assessment & Plan Note (Signed)
 No acute respiratory distress. No wheezing on exam. She is not labored in her speech. Coarse lung sounds. Pending CXR.  Differential diagnosis includes bronchitis , poorly controlled asthma, PNA, GERD,. Start Augmentin  with probiotics.  Advise using an albuterol  inhaler every four to six hours as needed for wheezing. Restart Symbicort  inhaler for chronic cough management. Consider pulmonology consult.

## 2023-12-09 NOTE — Assessment & Plan Note (Signed)
 Chronic, poorly controlled.  Discussed use of amlodipine  and reassured patient that when she presented to the emergency room in 2023 it was not after amlodipine  but after hydrochlorothiazide .  Start amlodipine  2.5 mg and monitor blood pressure.  Goal of blood pressure less than 130/80

## 2023-12-09 NOTE — Telephone Encounter (Signed)
 Copied from CRM [id number]. Topic: Clinical - Prescription Issue >> Dec 09, 2023  3:53 PM Pinkey ORN wrote: Reason for CRM: nystatin  (MYCOSTATIN ) 100000 UNIT/ML suspension >> Dec 09, 2023  3:54 PM Pinkey ORN wrote: Patient states that Dineen Rollene MATSU, FNP left off refilling her nystatin  (MYCOSTATIN ) 100000 UNIT/ML suspension when she sent over all the others to the pharmacy. Patient is requesting she resend it over so she's able to pickup in the morning.

## 2023-12-09 NOTE — Progress Notes (Signed)
 Assessment & Plan:  Bronchitis Assessment & Plan: No acute respiratory distress. No wheezing on exam. She is not labored in her speech. Coarse lung sounds. Pending CXR.  Differential diagnosis includes bronchitis , poorly controlled asthma, PNA, GERD,. Start Augmentin  with probiotics.  Advise using an albuterol  inhaler every four to six hours as needed for wheezing. Restart Symbicort  inhaler for chronic cough management. Consider pulmonology consult.   Orders: -     Amoxicillin -Pot Clavulanate; Take 1 tablet by mouth 2 (two) times daily for 7 days.  Dispense: 14 tablet; Refill: 0 -     DG Chest 2 View; Future  B12 deficiency -     Cyanocobalamin   Hypertension, unspecified type Assessment & Plan: Chronic, poorly controlled.  Discussed use of amlodipine  and reassured patient that when she presented to the emergency room in 2023 it was not after amlodipine  but after hydrochlorothiazide .  Start amlodipine  2.5 mg and monitor blood pressure.  Goal of blood pressure less than 130/80  Orders: -     amLODIPine  Besylate; Take 1 tablet (2.5 mg total) by mouth daily.  Dispense: 30 tablet; Refill: 2  Aphthous ulcer Assessment & Plan: Unable to appreciate on my exam. Advised to monitor closely and if she feels the area persist I would highly recommend ENT consult for consideration of biopsy.  For now we jointly agreed to monitor since she is having significant improvement.  No evidence of thrush.  Reassured her resuming Symbicort  , with diligence to washing her mouth out after use would be appropriate   Bilateral hand pain Assessment & Plan: D/D includes B12 deficiency, CTS, cervical spondylosis with radiculopathy   Continue gabapentin  300 mg 3 times daily.  consider a cervical MRI if symptoms persist and referral to orthopedics for further evaluation and possible nerve conduction studies.      Return precautions given.   Risks, benefits, and alternatives of the medications and treatment  plan prescribed today were discussed, and patient expressed understanding.   Education regarding symptom management and diagnosis given to patient on AVS either electronically or printed.  Return in about 2 weeks (around 12/23/2023).  [name], FNP  Subjective:    Patient ID: Nicole Escobar, female    DOB: 11-11-1975, 48 y.o.   MRN: [id number]  CC: [name] is a 48 y.o. female who presents today for follow up.   HPI: HPI Discussed the use of AI scribe software for clinical note transcription with the patient, who gave verbal consent to proceed.  History of Present Illness   Nicole Escobar is a 48 year old female who presents with a persistent cough and tongue lesion.  She has been experiencing a persistent cough for over a month, improved.  It is described as dry, sometimes productive, and worsens when lying down, often waking her up at night.    he has a history of quitting smoking 20 years ago. 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 She has been prescribed Symbicort  and albuterol  in the past, but is currently not using them due to concerns about her tongue.  She developed a tongue lesion during her illness, which was initially large and white with a blister-like appearance. It has since nearly resolved. She completed valtrex , nystatin .  Her blood pressure has been consistently high, with readings around 146/90 mmHg. She has a history of a reaction to HCTZ, which caused dizziness and anxiety ( ED visit 08/19/2021) .  She is currently not taking any blood pressure medication.  She experiences tingling and numbness in her hands, which she attributes to B12 deficiency. She would like b12 IM today. She takes gabapentin  300mg  TID, which helps .  Cervical xray April 2024 showed arthritic changes and disc space narrowing in her cervical spine.             She has not been taking amlodipine   2.5 mg   Pending referral to gastroenterology for left lower quadrant pain . LLQ pain has completed resolved.   She didn't schedule with GYN, for right ovarian cyst    Apththous  ulcer 11/24/2023.  Treated with nystatin  swish and swallow, valacyclovir , Tylenol , ibuprofen   Former smoker 2005 8/14/5 bronchitis treated with zpak , tessalon  perles, albuterol  10/30/23 moderate asthma prednisone  10/23/23 treated for asthma with prednisone   Due b12 IM  Allergies: Hydrochlorothiazide  and Sulfa antibiotics Current Outpatient Medications on File Prior to Visit  Medication Sig Dispense Refill   gabapentin  (NEURONTIN ) 300 MG capsule Take 1 capsule (300 mg total) by mouth 3 (three) times daily. 270 capsule 3   albuterol  (VENTOLIN  HFA) 108 (90 Base) MCG/ACT inhaler Inhale 2 puffs into the lungs every 6 (six) hours as needed for wheezing or shortness of breath. (Patient not taking: Reported on 12/09/2023) 8 g 0   budesonide -formoterol  (SYMBICORT ) 80-4.5 MCG/ACT inhaler Inhale 2 puffs into the lungs 2 (two) times daily. (Patient not taking: Reported on 12/09/2023) 1 each 0   nystatin  (MYCOSTATIN ) 100000 UNIT/ML suspension Take 5 mLs (500,000 Units total) by mouth 4 (four) times daily. (Patient not taking: Reported on 12/09/2023) 473 mL 0   No current facility-administered medications on file prior to visit.    Review of Systems  Constitutional:  Negative for chills and fever.  HENT:  Positive for congestion. Negative for sinus pressure, sore throat and trouble swallowing.   Respiratory:  Positive for cough and wheezing. Negative for shortness of breath.   Cardiovascular:  Negative for chest pain, palpitations and leg swelling.  Gastrointestinal:  Negative for nausea and vomiting.  Musculoskeletal:  Negative for back pain.  Neurological:  Positive for numbness.      Objective:    BP (!) 150/94   Pulse 90   Temp 97.7 F (36.5 C) (Oral)   Ht 5' 4 (1.626 m)   Wt 224 lb 12.8 oz (102 kg)   LMP  (LMP  Unknown)   SpO2 98%   BMI 38.59 kg/m  BP Readings from Last 3 Encounters:  12/09/23 (!) 150/94  11/24/23 (!) 142/82  07/27/23 (!) 154/96   Wt Readings from Last 3 Encounters:  12/09/23 224 lb 12.8 oz (102 kg)  11/24/23 225 lb 12.8 oz (102.4 kg)  07/27/23 209 lb 12.8 oz (95.2 kg)    Physical Exam Vitals reviewed.  Constitutional:      Appearance: She is well-developed.  HENT:     Head: Normocephalic and atraumatic.     Right Ear: Hearing, tympanic membrane, ear canal and external ear normal. No decreased hearing noted. No drainage, swelling or tenderness. No middle ear effusion. No foreign body. Tympanic membrane is not erythematous or bulging.     Left Ear: Hearing, tympanic membrane, ear canal and external ear normal. No decreased hearing noted. No drainage, swelling or tenderness.  No middle ear effusion. No foreign body. Tympanic membrane is not erythematous or bulging.     Nose: Nose normal. No rhinorrhea.     Right Sinus: No maxillary sinus tenderness or frontal sinus  tenderness.     Left Sinus: No maxillary sinus tenderness or frontal sinus tenderness.     Mouth/Throat:     Pharynx: Uvula midline. No oropharyngeal exudate or posterior oropharyngeal erythema.     Tonsils: No tonsillar abscesses.  Eyes:     Conjunctiva/sclera: Conjunctivae normal.  Cardiovascular:     Rate and Rhythm: Regular rhythm.     Pulses: Normal pulses.     Heart sounds: Normal heart sounds.  Pulmonary:     Effort: Pulmonary effort is normal.     Breath sounds: Normal breath sounds. No wheezing, rhonchi or rales.  Lymphadenopathy:     Head:     Right side of head: No submental, submandibular, tonsillar, preauricular, posterior auricular or occipital adenopathy.     Left side of head: No submental, submandibular, tonsillar, preauricular, posterior auricular or occipital adenopathy.     Cervical: No cervical adenopathy.  Skin:    General: Skin is warm and dry.  Neurological:     Mental Status:  She is alert.  Psychiatric:        Speech: Speech normal.        Behavior: Behavior normal.        Thought Content: Thought content normal.

## 2023-12-09 NOTE — Assessment & Plan Note (Addendum)
 D/D includes B12 deficiency, CTS, cervical spondylosis with radiculopathy   Continue gabapentin  300 mg 3 times daily.  consider a cervical MRI if symptoms persist and referral to orthopedics for further evaluation and possible nerve conduction studies.

## 2023-12-09 NOTE — Patient Instructions (Addendum)
 Please call [phone] option 1 for [facility] You may also call Dr Leonce, GYN, for consult right ovarian cyst. [phone]   I dont see oral lesion today; if concern for perisstence, let me know right away so I can arrange consult with ENT for consideration of biopsy  Start augmentin   Ensure to take probiotics while on antibiotics and also for 2 weeks after completion. This can either be by eating yogurt daily or taking a probiotic supplement over the counter such as Culturelle.It is important to re-colonize the gut with good bacteria and also to prevent any diarrheal infections associated with antibiotic use.   Chest xray today  Use albuterol  every 6 hours for first 24 hours to get good medication into the lungs and loosen congestion; after, you may use as needed and eventually stop all together when cough resolves.  Let me know how you are doing

## 2023-12-09 NOTE — Assessment & Plan Note (Signed)
 Unable to appreciate on my exam. Advised to monitor closely and if she feels the area persist I would highly recommend ENT consult for consideration of biopsy.  For now we jointly agreed to monitor since she is having significant improvement.  No evidence of thrush.  Reassured her resuming Symbicort  , with diligence to washing her mouth out after use would be appropriate

## 2023-12-10 MED ORDER — NYSTATIN 100000 UNIT/ML MT SUSP: 5.0000 mL | Freq: Four times a day (QID) | OROMUCOSAL | 0 refills | Status: AC

## 2023-12-10 NOTE — Telephone Encounter (Unsigned)
 Copied from CRM [id number]. Topic: Clinical - Prescription Issue >> Dec 10, 2023  9:06 AM Nicole Escobar wrote: Reason for CRM: Pt called in stating that her med nystatin  (MYCOSTATIN ) 100000 UNIT/ML suspension was left off the medications to be filled. Per pt chart the message was routed over to PCP and there hasn't been a response just yet. Pt stated she is on the way to pick up other meds from pharmacy.

## 2023-12-10 NOTE — Addendum Note (Signed)
 Addended by: DINEEN ROLLENE MATSU on: 12/10/2023 10:42 AM   Modules accepted: Orders

## 2023-12-10 NOTE — Telephone Encounter (Signed)
 Noted

## 2023-12-10 NOTE — Telephone Encounter (Signed)
 Refilled Sent pt mychart to let her know

## 2023-12-14 ENCOUNTER — Other Ambulatory Visit

## 2023-12-23 ENCOUNTER — Ambulatory Visit: Admitting: Family

## 2024-01-25 ENCOUNTER — Encounter: Payer: Self-pay | Admitting: Radiology

## 2024-03-17 ENCOUNTER — Emergency Department
Admission: EM | Admit: 2024-03-17 | Discharge: 2024-03-17 | Disposition: A | Discharge status: Home / Self Care | Attending: Emergency Medicine | Admitting: Emergency Medicine

## 2024-03-17 ENCOUNTER — Other Ambulatory Visit: Payer: Self-pay

## 2024-03-17 DIAGNOSIS — R509 Fever, unspecified: Secondary | ICD-10-CM | POA: Diagnosis not present

## 2024-03-17 DIAGNOSIS — J069 Acute upper respiratory infection, unspecified: Secondary | ICD-10-CM | POA: Insufficient documentation

## 2024-03-17 DIAGNOSIS — I10 Essential (primary) hypertension: Secondary | ICD-10-CM | POA: Insufficient documentation

## 2024-03-17 HISTORY — DX: Essential (primary) hypertension: I10

## 2024-03-17 LAB — RESP PANEL BY RT-PCR (RSV, FLU A&B, COVID)  RVPGX2
Influenza A by PCR: NEGATIVE
Influenza B by PCR: NEGATIVE
Resp Syncytial Virus by PCR: NEGATIVE
SARS Coronavirus 2 by RT PCR: NEGATIVE

## 2024-03-17 LAB — GROUP A STREP BY PCR: Group A Strep by PCR: NOT DETECTED

## 2024-03-17 MED ORDER — ACETAMINOPHEN 325 MG PO TABS
650.0000 mg | ORAL_TABLET | Freq: Once | ORAL | Status: AC
  Administered 2024-03-17: 650 mg via ORAL
  Filled 2024-03-17: qty 2

## 2024-03-17 MED ORDER — KETOROLAC TROMETHAMINE 30 MG/ML IJ SOLN
30.0000 mg | Freq: Once | INTRAMUSCULAR | Status: AC
  Administered 2024-03-17: 30 mg via INTRAMUSCULAR
  Filled 2024-03-17: qty 1

## 2024-03-17 MED ORDER — ONDANSETRON 4 MG PO TBDP
4.0000 mg | ORAL_TABLET | Freq: Once | ORAL | Status: AC
  Administered 2024-03-17: 4 mg via ORAL
  Filled 2024-03-17: qty 1

## 2024-03-17 NOTE — Discharge Instructions (Addendum)
 You were seen in the emergency department today for a cough. Your respiratory panel which includes COVID, influenza and RSV were negative.  Your rapid strep test is negative.  This is likely a viral upper respiratory infection. A viral cough may last up to 2-3 weeks.   Take tylenol  or ibuprofen  for pain or fever as directed.   Stay hydrated by drinking plenty of fluids to thin mucus. Get adequate amount of sleep and avoid overexertion. Consider a humidifier at night. Warm teas and a spoonful of honey may help reduce cough frequency. Follow up with your primary care provider as needed.

## 2024-03-17 NOTE — ED Triage Notes (Signed)
 Pt to ED for fever (100), cough and body aches that she woke up with this AM. States her work made her come. Respirations unlabored.

## 2024-03-17 NOTE — ED Provider Notes (Signed)
 "   [facility] Emergency Department Provider Note     Event Date/Time   First MD Initiated Contact with Patient 03/17/24 1422     (approximate)   History   Cough, Fever, and Generalized Body Aches   HPI  Nicole Escobar is a 48 y.o. female with a past medical history of anxiety, HTN and HLD presents to the ED for evaluation of subjective fever and generalized body aches all over with onset of this morning.  Patient reports she took Tylenol  at 6 AM and it did help with her symptoms however when medicine wore off symptoms returned.  She denies any chest pain or shortness of breath.  She endorses sick contacts as she works at [employer] hugged her yesterday with similar symptoms.     Physical Exam   Triage Vital Signs: ED Triage Vitals  Encounter Vitals Group     BP 03/17/24 1356 123/65     Girls Systolic BP Percentile --      Girls Diastolic BP Percentile --      Boys Systolic BP Percentile --      Boys Diastolic BP Percentile --      Pulse Rate 03/17/24 1356 (!) 108     Resp 03/17/24 1356 20     Temp 03/17/24 1356 98.3 F (36.8 C)     Temp Source 03/17/24 1356 Oral     SpO2 03/17/24 1356 98 %     Weight 03/17/24 1357 220 lb (99.8 kg)     Height 03/17/24 1357 5' 4 (1.626 m)     Head Circumference --      Peak Flow --      Pain Score 03/17/24 1355 8     Pain Loc --      Pain Education --      Exclude from Growth Chart --     Most recent vital signs: Vitals:   03/17/24 1415 03/17/24 1545  BP:  (!) 145/84  Pulse:  (!) 101  Resp:  20  Temp:    SpO2: 100% 98%   General: Alert and oriented. INAD.  Skin:  Warm, dry and intact. No rashes or lesions noted.     Head:  NCAT.  Eyes:  PERRLA. EOMI.  CV:  Good peripheral perfusion. RRR.  RESP:  Normal effort. LCTAB. No retractions.  ABD:  No distention. Soft, Non tender.   ED Results / Procedures / Treatments   Labs (all labs ordered are listed, but only abnormal results are  displayed) Labs Reviewed  GROUP A STREP BY PCR  RESP PANEL BY RT-PCR (RSV, FLU A&B, COVID)  [id number]   No results found.  PROCEDURES:  Critical Care performed: No  Procedures   MEDICATIONS ORDERED IN ED: Medications  ketorolac  (TORADOL ) 30 MG/ML injection 30 mg (30 mg Intramuscular Given 03/17/24 1603)  acetaminophen  (TYLENOL ) tablet 650 mg (650 mg Oral Given 03/17/24 1604)  ondansetron  (ZOFRAN -ODT) disintegrating tablet 4 mg (4 mg Oral Given 03/17/24 1605)     IMPRESSION / MDM / ASSESSMENT AND PLAN / ED COURSE  I reviewed the triage vital signs and the nursing notes.                                48 y.o. female presents to the emergency department for evaluation and treatment of cough. See HPI for further details.   Differential diagnosis includes, but is not limited to  viral URI, influenza, COVID  Patient's presentation is most consistent with acute, uncomplicated illness.  Patient is alert and oriented.  Noted initial pulse rate of 108 likely secondary to viral etiology.  Physical exam findings are stated above.  Normal lung exam.  No indication for further workup.  Respiratory panel is negative.  Discussion of symptomatic care treatment at home was provided.  Work note provided.  Patient is in stable condition for discharge home and outpatient management.  ED return precaution discussed.  FINAL CLINICAL IMPRESSION(S) / ED DIAGNOSES   Final diagnoses:  Upper respiratory tract infection, unspecified type   Rx / DC Orders   ED Discharge Orders     None      Note:  This document was prepared using Dragon voice recognition software and may include unintentional dictation errors.    Margrette, Apostolos Blagg A, PA-C 03/17/24 Nicole Waymond Lorelle Jerri, MD 03/17/24 [phone]  "
